# Patient Record
Sex: Female | Born: 1935 | Race: Black or African American | Hispanic: No | Smoking: Never smoker
Health system: Southern US, Community
[De-identification: ages and names within clinical notes are randomized; demographics above are authoritative.]

## PROBLEM LIST (undated history)

## (undated) DIAGNOSIS — I447 Left bundle-branch block, unspecified: Secondary | ICD-10-CM

## (undated) DIAGNOSIS — E785 Hyperlipidemia, unspecified: Secondary | ICD-10-CM

## (undated) DIAGNOSIS — I10 Essential (primary) hypertension: Secondary | ICD-10-CM

## (undated) DIAGNOSIS — R55 Syncope and collapse: Secondary | ICD-10-CM

## (undated) DIAGNOSIS — R011 Cardiac murmur, unspecified: Secondary | ICD-10-CM

## (undated) DIAGNOSIS — Z87440 Personal history of urinary (tract) infections: Secondary | ICD-10-CM

## (undated) HISTORY — DX: Essential (primary) hypertension: I10

## (undated) HISTORY — DX: Hyperlipidemia, unspecified: E78.5

## (undated) HISTORY — PX: ABDOMINAL HYSTERECTOMY: SHX81

## (undated) HISTORY — DX: Personal history of urinary (tract) infections: Z87.440

## (undated) HISTORY — DX: Cardiac murmur, unspecified: R01.1

## (undated) HISTORY — DX: Syncope and collapse: R55

## (undated) HISTORY — DX: Left bundle-branch block, unspecified: I44.7

---

## 2004-07-27 ENCOUNTER — Ambulatory Visit: Payer: Self-pay

## 2005-02-28 ENCOUNTER — Ambulatory Visit: Payer: Self-pay

## 2005-06-05 ENCOUNTER — Ambulatory Visit: Payer: Self-pay

## 2005-08-14 ENCOUNTER — Ambulatory Visit: Payer: Self-pay

## 2006-04-25 ENCOUNTER — Ambulatory Visit: Payer: Self-pay | Admitting: Unknown Physician Specialty

## 2006-05-09 ENCOUNTER — Ambulatory Visit: Payer: Self-pay | Admitting: Unknown Physician Specialty

## 2006-08-29 ENCOUNTER — Ambulatory Visit: Payer: Self-pay

## 2007-09-16 ENCOUNTER — Ambulatory Visit: Payer: Self-pay

## 2008-09-21 ENCOUNTER — Ambulatory Visit: Payer: Self-pay

## 2009-10-10 ENCOUNTER — Ambulatory Visit: Payer: Self-pay

## 2010-07-11 ENCOUNTER — Emergency Department: Payer: Self-pay | Admitting: Emergency Medicine

## 2010-10-31 ENCOUNTER — Ambulatory Visit: Payer: Self-pay

## 2011-12-06 ENCOUNTER — Ambulatory Visit: Payer: Self-pay

## 2012-12-25 ENCOUNTER — Ambulatory Visit: Payer: Self-pay

## 2013-03-31 ENCOUNTER — Observation Stay: Payer: Self-pay | Admitting: Student

## 2013-03-31 LAB — URINALYSIS, COMPLETE
BACTERIA: NONE SEEN
Bilirubin,UR: NEGATIVE
GLUCOSE, UR: NEGATIVE mg/dL (ref 0–75)
NITRITE: NEGATIVE
PROTEIN: NEGATIVE
Ph: 8 (ref 4.5–8.0)
Specific Gravity: 1.004 (ref 1.003–1.030)
Squamous Epithelial: 2
WBC UR: 4 /HPF (ref 0–5)

## 2013-03-31 LAB — TROPONIN I: TROPONIN-I: 0.03 ng/mL

## 2013-03-31 LAB — BASIC METABOLIC PANEL
Anion Gap: 7 (ref 7–16)
BUN: 10 mg/dL (ref 7–18)
Calcium, Total: 8.6 mg/dL (ref 8.5–10.1)
Chloride: 102 mmol/L (ref 98–107)
Co2: 26 mmol/L (ref 21–32)
Creatinine: 0.79 mg/dL (ref 0.60–1.30)
EGFR (African American): >60
EGFR (Non-African Amer.): >60
GLUCOSE: 93 mg/dL (ref 65–99)
Osmolality: 269 (ref 275–301)
Potassium: 3.6 mmol/L (ref 3.5–5.1)
SODIUM: 135 mmol/L — AB (ref 136–145)

## 2013-03-31 LAB — CBC WITH DIFFERENTIAL/PLATELET
BASOS PCT: 0.6 %
Basophil #: 0 10*3/uL (ref 0.0–0.1)
Eosinophil #: 0.1 10*3/uL (ref 0.0–0.7)
Eosinophil %: 0.7 %
HCT: 40.7 % (ref 35.0–47.0)
HGB: 13.1 g/dL (ref 12.0–16.0)
LYMPHS ABS: 2.6 10*3/uL (ref 1.0–3.6)
LYMPHS PCT: 38.7 %
MCH: 29.4 pg (ref 26.0–34.0)
MCHC: 32.1 g/dL (ref 32.0–36.0)
MCV: 91 fL (ref 80–100)
Monocyte #: 0.7 x10 3/mm (ref 0.2–0.9)
Monocyte %: 10.1 %
Neutrophil #: 3.4 10*3/uL (ref 1.4–6.5)
Neutrophil %: 49.9 %
Platelet: 254 10*3/uL (ref 150–440)
RBC: 4.45 10*6/uL (ref 3.80–5.20)
RDW: 13.3 % (ref 11.5–14.5)
WBC: 6.8 10*3/uL (ref 3.6–11.0)

## 2013-03-31 LAB — CK-MB
CK-MB: 1.9 ng/mL (ref 0.5–3.6)
CK-MB: 1.9 ng/mL (ref 0.5–3.6)

## 2013-04-01 DIAGNOSIS — I359 Nonrheumatic aortic valve disorder, unspecified: Secondary | ICD-10-CM

## 2013-04-01 LAB — CBC WITH DIFFERENTIAL/PLATELET
BASOS ABS: 0.1 10*3/uL (ref 0.0–0.1)
Basophil %: 0.9 %
EOS ABS: 0 10*3/uL (ref 0.0–0.7)
Eosinophil %: 0.7 %
HCT: 37.3 % (ref 35.0–47.0)
HGB: 12.6 g/dL (ref 12.0–16.0)
LYMPHS ABS: 2.2 10*3/uL (ref 1.0–3.6)
LYMPHS PCT: 33.9 %
MCH: 30.6 pg (ref 26.0–34.0)
MCHC: 33.9 g/dL (ref 32.0–36.0)
MCV: 91 fL (ref 80–100)
MONOS PCT: 10.4 %
Monocyte #: 0.7 x10 3/mm (ref 0.2–0.9)
Neutrophil #: 3.5 10*3/uL (ref 1.4–6.5)
Neutrophil %: 54.1 %
Platelet: 240 10*3/uL (ref 150–440)
RBC: 4.12 10*6/uL (ref 3.80–5.20)
RDW: 13 % (ref 11.5–14.5)
WBC: 6.5 10*3/uL (ref 3.6–11.0)

## 2013-04-01 LAB — MAGNESIUM: Magnesium: 2.2 mg/dL

## 2013-04-01 LAB — BASIC METABOLIC PANEL
ANION GAP: 4 — AB (ref 7–16)
BUN: 10 mg/dL (ref 7–18)
CALCIUM: 8.4 mg/dL — AB (ref 8.5–10.1)
CREATININE: 0.77 mg/dL (ref 0.60–1.30)
Chloride: 108 mmol/L — ABNORMAL HIGH (ref 98–107)
Co2: 29 mmol/L (ref 21–32)
EGFR (Non-African Amer.): >60
GLUCOSE: 112 mg/dL — AB (ref 65–99)
OSMOLALITY: 281 (ref 275–301)
Potassium: 3.4 mmol/L — ABNORMAL LOW (ref 3.5–5.1)
Sodium: 141 mmol/L (ref 136–145)

## 2013-04-01 LAB — TROPONIN I: TROPONIN-I: 0.04 ng/mL

## 2013-04-01 LAB — OCCULT BLOOD X 1 CARD TO LAB, STOOL: Occult Blood, Feces: NEGATIVE

## 2013-04-01 LAB — CK-MB: CK-MB: 2.4 ng/mL (ref 0.5–3.6)

## 2013-04-02 ENCOUNTER — Telehealth: Payer: Self-pay

## 2013-04-02 NOTE — Telephone Encounter (Signed)
Called pt to see what hospital she was seen in. Scheduled as [facility], but not in [place] .

## 2013-04-13 ENCOUNTER — Encounter: Payer: Self-pay | Admitting: *Deleted

## 2013-04-21 ENCOUNTER — Encounter: Payer: Self-pay | Admitting: Cardiovascular Disease

## 2013-04-21 ENCOUNTER — Ambulatory Visit (INDEPENDENT_AMBULATORY_CARE_PROVIDER_SITE_OTHER): Payer: Medicare Other | Admitting: Cardiovascular Disease

## 2013-04-21 VITALS — BP 168/83 | HR 71 | Ht 64.0 in | Wt 153.2 lb

## 2013-04-21 DIAGNOSIS — I10 Essential (primary) hypertension: Secondary | ICD-10-CM

## 2013-04-21 DIAGNOSIS — R55 Syncope and collapse: Secondary | ICD-10-CM

## 2013-04-21 MED ORDER — AMLODIPINE BESYLATE 10 MG PO TABS: 10.0000 mg | ORAL_TABLET | Freq: Every day | ORAL | Status: DC

## 2013-04-21 NOTE — Patient Instructions (Signed)
Your physician has recommended you make the following change in your medication:  Amlodipine 10 mg once daily   Your physician wants you to follow-up in: 6 months. You will receive a reminder letter in the mail two months in advance. If you don't receive a letter, please call our office to schedule the follow-up appointment.

## 2013-04-23 ENCOUNTER — Encounter: Payer: Self-pay | Admitting: Cardiovascular Disease

## 2013-04-23 DIAGNOSIS — I10 Essential (primary) hypertension: Secondary | ICD-10-CM | POA: Insufficient documentation

## 2013-04-23 DIAGNOSIS — R55 Syncope and collapse: Secondary | ICD-10-CM | POA: Insufficient documentation

## 2013-04-23 NOTE — Assessment & Plan Note (Signed)
Blood pressure continues to be elevated. I increased amlodipine to 10 mg once daily. I consider switching metoprolol to carvedilol. However, this might lead to more dizziness. If further blood pressure control is needed, I will consider adding an ACE inhibitor or an ARB. We might have to a low blood pressure to be on the high side due to orthostatic hypotension.

## 2013-04-23 NOTE — Progress Notes (Signed)
Primary care physician: Dr. Loma Sender   HPI  This is an 80 year old female who was referred for evaluation of presyncope after recent hospitalization in March at [facility] . She has been seen by [place] in the past. She has known history of hypertension and hyperlipidemia. She presented after she had presyncopal episodes. It was mostly after standing up. There was no palpitations, dyspnea or chest pain. She was found a blood pressure of 207/88 on presentation. Cardiac enzymes were negative. ECG showed sinus rhythm with left bundle branch block. Telemetry showed no arrhythmia. Echocardiogram showed an ejection fraction of 60-65%. Carotid Doppler showed less than 50% stenosis bilaterally. CT head without contrast showed no acute abnormalities. She used to be on hydrochlorothiazide but that was switched amlodipine. Percent continues to be elevated at home. Her blood pressure tends to be very labile.    Allergies  Allergen Reactions  . Statins     Hives     Current Outpatient Prescriptions on File Prior to Visit  Medication Sig Dispense Refill  . aspirin 81 MG tablet Take 81 mg by mouth daily.      Marland Kitchen ezetimibe (ZETIA) 10 MG tablet Take 10 mg by mouth daily.      . metoprolol tartrate (LOPRESSOR) 25 MG tablet Take 25 mg by mouth 2 (two) times daily.       No current facility-administered medications on file prior to visit.     Past Medical History  Diagnosis Date  . Hyperlipidemia   . Hypertension   . Syncope and collapse   . History of kidney infection   . Heart murmur   . LBBB (left bundle branch block)      Past Surgical History  Procedure Laterality Date  . Abdominal hysterectomy       Family History  Problem Relation Age of Onset  . Stroke Mother   . Hypertension Mother      History   Social History  . Marital Status: Divorced    Spouse Name: N/A    Number of Children: N/A  . Years of Education: N/A   Occupational History  . Not on file.   Social History Main  Topics  . Smoking status: Never Smoker   . Smokeless tobacco: Not on file  . Alcohol Use: No  . Drug Use: No  . Sexual Activity: Not on file   Other Topics Concern  . Not on file   Social History Narrative  . No narrative on file      PHYSICAL EXAM   BP 168/83  Pulse 71  Ht 5\' 4"  (1.626 m)  Wt 153 lb 4 oz (69.514 kg)  BMI 26.29 kg/m2 Constitutional: She is oriented to person, place, and time. She appears well-developed and well-nourished. No distress.  HENT: No nasal discharge.  Head: Normocephalic and atraumatic.  Eyes: Pupils are equal and round. No discharge.  Neck: Normal range of motion. Neck supple. No JVD present. No thyromegaly present.  Cardiovascular: Normal rate, regular rhythm, normal heart sounds. Exam reveals no gallop and no friction rub. No murmur heard.  Pulmonary/Chest: Effort normal and breath sounds normal. No stridor. No respiratory distress. She has no wheezes. She has no rales. She exhibits no tenderness.  Abdominal: Soft. Bowel sounds are normal. She exhibits no distension. There is no tenderness. There is no rebound and no guarding.  Musculoskeletal: Normal range of motion. She exhibits no edema and no tenderness.  Neurological: She is alert and oriented to person, place, and time. Coordination normal.  Skin: Skin is warm and dry. No rash noted. She is not diaphoretic. No erythema. No pallor.  Psychiatric: She has a normal mood and affect. Her behavior is normal. Judgment and thought content normal.     BJY:NWGNFEKG:Sinus  Rhythm  - frequent PAC s  # PACs = 2. - left bundle branch block.    ABNORMAL    ASSESSMENT AND PLAN

## 2013-04-23 NOTE — Assessment & Plan Note (Signed)
I suspect that this is likely due to orthostatic hypotension. She is thin orthostatic today. She does have left bundle branch block with echocardiogram showed normal LV systolic function. There is no suggestion of arrhythmia. I agree with avoiding diuretics. I instructed her to avoid sudden standing up and to stay well-hydrated. If this continues to be an issue after adjusting her medications, thigh-high support stockings can be considered.

## 2013-10-23 ENCOUNTER — Encounter: Payer: Self-pay | Admitting: Cardiovascular Disease

## 2013-10-23 ENCOUNTER — Ambulatory Visit (INDEPENDENT_AMBULATORY_CARE_PROVIDER_SITE_OTHER): Payer: Medicare Other | Admitting: Cardiovascular Disease

## 2013-10-23 VITALS — BP 120/64 | HR 72 | Ht 64.0 in | Wt 156.0 lb

## 2013-10-23 DIAGNOSIS — I10 Essential (primary) hypertension: Secondary | ICD-10-CM

## 2013-10-23 DIAGNOSIS — R55 Syncope and collapse: Secondary | ICD-10-CM

## 2013-10-23 DIAGNOSIS — R0602 Shortness of breath: Secondary | ICD-10-CM

## 2013-10-23 NOTE — Patient Instructions (Addendum)
[facility] MYOVIEW  Your caregiver has ordered a Stress Test with nuclear imaging. The purpose of this test is to evaluate the blood supply to your heart muscle. This procedure is referred to as a "Non-Invasive Stress Test." This is because other than having an IV started in your vein, nothing is inserted or "invades" your body. Cardiac stress tests are done to find areas of poor blood flow to the heart by determining the extent of coronary artery disease (CAD). Some patients exercise on a treadmill, which naturally increases the blood flow to your heart, while others who are  unable to walk on a treadmill due to physical limitations have a pharmacologic/chemical stress agent called Lexiscan . This medicine will mimic walking on a treadmill by temporarily increasing your coronary blood flow.   Please note: these test may take anywhere between 2-4 hours to complete  PLEASE REPORT TO [facility] ENTR[facility]  THE VOLUNTEERS AT THE FIRST DESK WILL DIRECT YOU WHERE TO GO  Date of Procedure:______10/15/15_______________________________  Arrival Time for Procedure:_______0945 am _______________________   PLEASE NOTIFY THE OFFICE AT LEAST 24 HOURS IN ADVANCE IF YOU ARE UNABLE TO KEEP YOUR APPOINTMENT.  [phone] AND  PLEASE NOTIFY [facility] AT [facility] AT LEAST 24 HOURS IN ADVANCE IF YOU ARE UNABLE TO KEEP YOUR APPOINTMENT. [phone]  How to prepare for your Myoview test:  1. Do not eat or drink after midnight 2. No caffeine for 24 hours prior to test 3. No smoking 24 hours prior to test. 4. Your medication may be taken with water.  If your doctor stopped a medication because of this test, do not take that medication. 5. Ladies, please do not wear dresses.  Skirts or pants are appropriate. Please wear a short sleeve shirt. 6. No perfume, cologne or lotion. 7. Wear comfortable walking shoes. No heels!   Your physician recommends that you schedule a follow-up appointment in:  As needed with  Dr. Kirke CorinArida

## 2013-10-23 NOTE — Assessment & Plan Note (Signed)
This was likely due to orthostatic hypotension and has resolved after stopping hydrochlorothiazide.

## 2013-10-23 NOTE — Progress Notes (Signed)
Primary care physician: Dr. Loma Senderharles Phillips   HPI  This is an 80 year old female who is here today for a followup visit regarding presyncope which was thought to be due to orthostatic hypotension. Symptoms improved after stopping hydrochlorothiazide. She has known history of hypertension and hyperlipidemia. Echocardiogram showed an ejection fraction of 60-65%. Carotid Doppler showed less than 50% stenosis bilaterally. CT head without contrast showed no acute abnormalities. Blood pressure is now overall controlled with amlodipine. She has not had any significant dizziness, syncope or presyncope. However, she is complaining of significant exertional fatigue and dyspnea without chest discomfort which is unusual for her given that she has been active throughout her life.   Allergies  Allergen Reactions  . Statins     Hives     Current Outpatient Prescriptions on File Prior to Visit  Medication Sig Dispense Refill  . amLODipine (NORVASC) 10 MG tablet Take 1 tablet (10 mg total) by mouth daily.  30 tablet  6  . aspirin 81 MG tablet Take 81 mg by mouth daily.      Marland Kitchen. ezetimibe (ZETIA) 10 MG tablet Take 10 mg by mouth daily.       No current facility-administered medications on file prior to visit.     Past Medical History  Diagnosis Date  . Hyperlipidemia   . Syncope and collapse   . History of kidney infection   . Heart murmur   . LBBB (left bundle branch block)   . Hypertension      Past Surgical History  Procedure Laterality Date  . Abdominal hysterectomy       Family History  Problem Relation Age of Onset  . Stroke Mother   . Hypertension Mother      History   Social History  . Marital Status: Divorced    Spouse Name: N/A    Number of Children: N/A  . Years of Education: N/A   Occupational History  . Not on file.   Social History Main Topics  . Smoking status: Never Smoker   . Smokeless tobacco: Not on file  . Alcohol Use: No  . Drug Use: No  . Sexual  Activity: Not on file   Other Topics Concern  . Not on file   Social History Narrative  . No narrative on file      PHYSICAL EXAM   BP 120/64  Pulse 72  Ht 5\' 4"  (1.626 m)  Wt 156 lb (70.761 kg)  BMI 26.76 kg/m2 Constitutional: She is oriented to person, place, and time. She appears well-developed and well-nourished. No distress.  HENT: No nasal discharge.  Head: Normocephalic and atraumatic.  Eyes: Pupils are equal and round. No discharge.  Neck: Normal range of motion. Neck supple. No JVD present. No thyromegaly present.  Cardiovascular: Normal rate, regular rhythm, normal heart sounds. Exam reveals no gallop and no friction rub. No murmur heard.  Pulmonary/Chest: Effort normal and breath sounds normal. No stridor. No respiratory distress. She has no wheezes. She has no rales. She exhibits no tenderness.  Abdominal: Soft. Bowel sounds are normal. She exhibits no distension. There is no tenderness. There is no rebound and no guarding.  Musculoskeletal: Normal range of motion. She exhibits no edema and no tenderness.  Neurological: She is alert and oriented to person, place, and time. Coordination normal.  Skin: Skin is warm and dry. No rash noted. She is not diaphoretic. No erythema. No pallor.  Psychiatric: She has a normal mood and affect. Her behavior is normal. Judgment  and thought content normal.     GEX:BMWUXEKG:Sinus  Rhythm  -Incomplete left bundle branch block.   -Nonspecific ST depression  -Nondiagnostic.   ABNORMAL    ASSESSMENT AND PLAN

## 2013-10-23 NOTE — Assessment & Plan Note (Signed)
The patient has significant exertional dyspnea without chest discomfort. She has underlying left bundle branch block. The dyspnea could be multifactorial due to age and physical deconditioning. However, underlying ischemic heart disease has not been evaluated. I requested a pharmacologic nuclear stress test for evaluation.

## 2013-10-23 NOTE — Assessment & Plan Note (Signed)
Blood pressure is well controlled now on amlodipine. She only has trace lower extremity edema.

## 2013-10-29 ENCOUNTER — Ambulatory Visit: Payer: Self-pay | Admitting: Cardiovascular Disease

## 2013-10-29 DIAGNOSIS — R0602 Shortness of breath: Secondary | ICD-10-CM

## 2013-10-30 ENCOUNTER — Other Ambulatory Visit: Payer: Self-pay

## 2013-10-30 DIAGNOSIS — R0602 Shortness of breath: Secondary | ICD-10-CM

## 2013-11-02 ENCOUNTER — Encounter: Payer: Self-pay | Admitting: *Deleted

## 2014-01-01 ENCOUNTER — Emergency Department: Payer: Self-pay | Admitting: Emergency Medicine

## 2014-01-01 LAB — CBC WITH DIFFERENTIAL/PLATELET
BASOS ABS: 0 10*3/uL (ref 0.0–0.1)
Basophil %: 0.7 %
EOS PCT: 0.9 %
Eosinophil #: 0.1 10*3/uL (ref 0.0–0.7)
HCT: 43.1 % (ref 35.0–47.0)
HGB: 14 g/dL (ref 12.0–16.0)
LYMPHS ABS: 2.1 10*3/uL (ref 1.0–3.6)
LYMPHS PCT: 31.2 %
MCH: 30.4 pg (ref 26.0–34.0)
MCHC: 32.5 g/dL (ref 32.0–36.0)
MCV: 94 fL (ref 80–100)
MONO ABS: 0.5 x10 3/mm (ref 0.2–0.9)
Monocyte %: 7.9 %
NEUTROS ABS: 4 10*3/uL (ref 1.4–6.5)
Neutrophil %: 59.3 %
PLATELETS: 273 10*3/uL (ref 150–440)
RBC: 4.61 10*6/uL (ref 3.80–5.20)
RDW: 13.2 % (ref 11.5–14.5)
WBC: 6.8 10*3/uL (ref 3.6–11.0)

## 2014-01-01 LAB — URINALYSIS, COMPLETE
Bilirubin,UR: NEGATIVE
GLUCOSE, UR: NEGATIVE mg/dL (ref 0–75)
Hyaline Cast: 11
Ketone: NEGATIVE
Nitrite: NEGATIVE
PROTEIN: NEGATIVE
Ph: 6 (ref 4.5–8.0)
Specific Gravity: 1.012 (ref 1.003–1.030)
WBC UR: 12 /HPF (ref 0–5)

## 2014-01-01 LAB — BASIC METABOLIC PANEL
ANION GAP: 6 — AB (ref 7–16)
BUN: 11 mg/dL (ref 7–18)
CALCIUM: 9.5 mg/dL (ref 8.5–10.1)
Chloride: 102 mmol/L (ref 98–107)
Co2: 30 mmol/L (ref 21–32)
Creatinine: 0.94 mg/dL (ref 0.60–1.30)
EGFR (African American): >60
EGFR (Non-African Amer.): >60
GLUCOSE: 176 mg/dL — AB (ref 65–99)
OSMOLALITY: 279 (ref 275–301)
POTASSIUM: 3.5 mmol/L (ref 3.5–5.1)
Sodium: 138 mmol/L (ref 136–145)

## 2014-01-01 LAB — TROPONIN I: Troponin-I: 0.03 ng/mL

## 2014-01-03 LAB — URINE CULTURE

## 2014-02-11 ENCOUNTER — Ambulatory Visit: Payer: Self-pay

## 2014-05-08 NOTE — H&P (Signed)
PATIENT NAME:  Samantha Saunders, Samantha Saunders MR#:  [id number] DATE OF BIRTH:  1926/05/08  DATE OF ADMISSION:  03/31/2013  ADMITTING PHYSICIAN: Enid Baas, MD  PRIMARY CARE PHYSICIAN: Marcine Matar., MD  CHIEF COMPLAINT: Near syncope.   HISTORY OF PRESENT ILLNESS: Samantha Saunders is an 81 year old Caucasian female with past medical history significant for hypertension and hyperlipidemia who presents after she had a near syncopal episode at home this morning. The patient states she had a similar episode while she was taking a shower 2 weeks ago where she almost blacked out, but did not completely pass out. She did not pay much attention to it, but today she was going into the kitchen to get some water. She was at the sink and then suddenly everything went black. She felt weak but did not fall on the floor. She states that after a few minutes her vision improved again. She does have on and off diarrhea for several months, occasional dark stools, but she states it depends upon what she eats and has been eating and drinking fine recently. She is currently alert, oriented and not in any distress at this time. No previous history of strokes or mini strokes in the past. No seizure history.   PAST MEDICAL HISTORY:  1.  Hypertension.  2.  Hyperlipidemia.   PAST SURGICAL HISTORY:  Hysterectomy.   ALLERGIES TO MEDICATIONS: INTOLERANT TO STATIN AS IT CAUSES HIVES.   CURRENT HOME MEDICATIONS: Aspirin 81 milligrams by mouth daily. She also takes blood pressure and hypercholesterol medicine, but does not know the names of them.   SOCIAL HISTORY: Lives at home by herself. No smoking or alcohol use.   FAMILY HISTORY: History of strokes in the family.   REVIEW OF SYSTEMS:  CONSTITUTIONAL: No fever, fatigue or weakness.  EYES: No blurry vision, double vision, inflammation, glaucoma or cataracts. Uses reading glasses.  EARS, NOSE AND THROAT: No tinnitus, ear pain, hearing loss, epistaxis or discharge.   RESPIRATORY: No cough, wheeze, hemoptysis or COPD.  CARDIOVASCULAR: No chest pain, orthopnea, edema, arrhythmia, palpitations, osteopenia or syncope.  GASTROINTESTINAL: No nausea or vomiting. Positive for occasional diarrhea. No abdominal pain, hematemesis or melena.  GENITOURINARY: No dysuria, hematuria, renal calculus, frequency or incontinence.  ENDOCRINE: No polyuria, nocturia, thyroid problems, heat or cold intolerance.  HEMATOLOGY: No anemia, easy bruising or bleeding.  SKIN: No acne, rash or lesions.  MUSCULOSKELETAL: No neck, back, shoulder pain, arthritis or gout.  NEUROLOGIC: No numbness, weakness, CVA, TIA or seizures.  PSYCHOLOGIC: No anxiety, insomnia or depression.   PHYSICAL EXAMINATION:  VITAL SIGNS: Temperature 98.4 degrees Fahrenheit, pulse 91, respirations 20, blood pressure 170/94, pulse oximetry 99% on room air.  GENERAL: Well-built, well-nourished female lying in bed, not in any acute distress.  HEENT: Normocephalic, atraumatic. Pupils equal, round and reacting to light. Anicteric sclerae. Extraocular movements intact.  OROPHARYNX: Clear without erythema, mass or exudates.  NECK: Supple. No thyromegaly, JVD or carotid bruits. No lymphadenopathy.  LUNGS: Moving air bilaterally. No wheeze or crackles. No use of accessory muscles for breathing.  CARDIOVASCULAR: S1, S2 regular rate and rhythm. No murmurs, rubs or gallops.  ABDOMEN: Soft, nontender and nondistended. No hepatosplenomegaly. Normal bowel sounds.  EXTREMITIES: No pedal edema. No clubbing or cyanosis. 2+ dorsalis pedis pulses palpable bilaterally.  SKIN: No acne, rash or lesions.  LYMPHATICS: No cervical or inguinal lymphadenopathy.  NEUROLOGIC: Cranial nerves intact. No focal motor or sensory deficits.  PSYCHOLOGICAL: The patient is awake, alert and oriented x3. No focal motor or  sensory deficits.   LAB DATA: WBC 6.8, hemoglobin 13.1, hematocrit 40.7 and platelet count 254,000.   Sodium 133, potassium 3.6,  chloride 102, bicarbonate 26, BUN 10, creatinine 0.79, glucose 93 and calcium of 8.6. Troponin less than 0.02. Chest x-ray revealing no acute cardiopulmonary disease. Aortic calcification is noted. EKG showing normal sinus rhythm. No acute ST-T wave abnormalities. She also has left bundle branch block.   ASSESSMENT AND PLAN: An 81 year old female with hypertension and hyperlipidemia who presents with near syncope.  1.  Near syncope/syncope. Either cardiogenic or vasovagal in nature.  Her EKG is left bundle branch block. No previous EKG to compare with. Admit, monitor on telemetry, get an echocardiogram and also carotid Dopplers. Continue her baby aspirin at this time and monitor on telemetry.  Recycle troponins. The patient denies any chest pain.  2.  Accelerated hypertension. Blood pressure elevated. Will give IV hydralazine push stat.  Does not know her home medication, so will start on metoprolol and as needed IV hydralazine.  3.  Hyperlipidemia. Again wait until we conform her home medication.   CODE STATUS: FULL CODE.   TIME SPENT ON ADMISSION: Fifty minutes.    ____________________________ Enid Baasadhika Deonne Rooks, MD [id number] D: 03/31/2013 20:48:58 ET T: 03/31/2013 21:03:04 ET JOB#: [id number]  cc: Enid Baasadhika Reka Wist, MD, <Dictator> Marcine Matarharles W. Phillips Jr., MD Enid BaasADHIKA Holland Kotter MD ELECTRONICALLY SIGNED 04/09/2013 16:01

## 2014-05-08 NOTE — Discharge Summary (Signed)
PATIENT NAME:  Samantha Saunders, Samantha Saunders MR#:  [id number] DATE OF BIRTH:  Jun 08, 1926  DATE OF ADMISSION:  03/31/2013 DATE OF DISCHARGE:  04/01/2013  PRIMARY CARE PHYSICIAN: Marcine Matarharles W. Phillips Jr., MD  CHIEF COMPLAINT: Near-syncope.   DISCHARGE DIAGNOSES: 1. Near syncope, likely secondary to accelerated hypertension.  2.   carotid artery plaque which is mild.  3. Hypertension.  4. Hyperlipidemia.  5. Left bundle  branch block of unknown duration.    DISCHARGE MEDICATIONS: Aspirin 81 milligrams daily, Zetia 10 milligrams once a day, metoprolol tartrate 25 milligrams 2 times a day, hydrochlorothiazide 25 milligrams once a day.   DIET: Low sodium.   ACTIVITY: As tolerated.   FOLLOWUP: Please follow with PCP within 1 to 2 weeks. Please follow with cardiology as scheduled for you with Dr. Kirke CorinArida.   DISPOSITION: Home.   CODE STATUS: The patient is FULL CODE.   SIGNIFICANT LABS AND IMAGING: Initial BUN 10, creatinine 0.79, sodium 135. Troponins were negative x3. CK-MBs were within normal limits x3. Initial white count of 6.8, hemoglobin 13.1. UA did not suggest infection. Stool culture: Stool Hemoccult was negative.   Echocardiogram showed normal EF of 60% to 65% with some impaired relaxation pattern of diastolic filling. Ultrasound of the carotids showed moderate atherosclerotic disease in the carotid arteries without significant stenosis. CT of head without contrast: No acute intracranial pathology.   HISTORY OF PRESENT ILLNESS AND HOSPITAL COURSE: For full details of H and P, please see the dictation on March 17 by Dr. Nemiah CommanderKalisetti, but briefly this is an 81 year old with hypertension, hyperlipidemia, who had had 2 bouts of presyncope. The first was a couple of weeks ago in the shower. There was no loss of consciousness or passing out. She had no palpitations or chest pains. She was admitted to the hospitalist service for observation. She was ruled out for acute coronary syndrome. Of note, she did have  elevated blood pressure on arrival and per ED notes, pressures were as high as 207/88. I suspect that played a role with her symptoms. Here, she underwent cyclic cardiac markers, which were negative. She underwent an echocardiogram which showed no significant valvular disease and ultrasound of the carotids showed no significant stenosis although there are some plaque. She is not able to take statins. She will be discharged with aspirin, Zetia and add low-dose metoprolol has been added to her blood pressure medications. She did not have any significant arrhythmias while on telemetry. Although the echocardiogram did show evidence for diastolic dysfunction, she is not in acute CHF and I think that it is likely chronic. She did have a left bundle branch block on EKG; however,  we have no previous EKGs to compare. She, of note, was ruled out for acute coronary syndrome and we have made an appointment with Dr. Kirke CorinArida for followup on Tuesday, April 7 at 2:45 p.m. and this was discussed with the patient. She has been ambulating without significant symptoms and at this point pressures have improved and she will be discharged with the above medications.   TOTAL TIME SPENT: Forty minutes.    ____________________________ Krystal EatonShayiq Lella Mullany, MD [id number] D: 04/02/2013 17:20:00 ET T: 04/03/2013 00:21:54 ET JOB#: [id number]  cc: Krystal EatonShayiq Yesli Vanderhoff, MD, <Dictator> Krystal EatonSHAYIQ Mayce Noyes MD ELECTRONICALLY SIGNED 04/16/2013 15:49

## 2015-11-20 ENCOUNTER — Emergency Department
Admission: EM | Admit: 2015-11-20 | Discharge: 2015-11-20 | Disposition: A | Discharge status: Home / Self Care | Payer: Medicare Other | Attending: Emergency Medicine | Admitting: Emergency Medicine

## 2015-11-20 ENCOUNTER — Emergency Department: Payer: Medicare Other

## 2015-11-20 ENCOUNTER — Encounter: Payer: Self-pay | Admitting: Emergency Medicine

## 2015-11-20 DIAGNOSIS — R42 Dizziness and giddiness: Secondary | ICD-10-CM | POA: Diagnosis present

## 2015-11-20 DIAGNOSIS — Z79899 Other long term (current) drug therapy: Secondary | ICD-10-CM | POA: Diagnosis not present

## 2015-11-20 DIAGNOSIS — I10 Essential (primary) hypertension: Secondary | ICD-10-CM | POA: Diagnosis not present

## 2015-11-20 DIAGNOSIS — R55 Syncope and collapse: Secondary | ICD-10-CM | POA: Diagnosis not present

## 2015-11-20 DIAGNOSIS — Z7982 Long term (current) use of aspirin: Secondary | ICD-10-CM | POA: Diagnosis not present

## 2015-11-20 DIAGNOSIS — R51 Headache: Secondary | ICD-10-CM | POA: Diagnosis not present

## 2015-11-20 LAB — COMPREHENSIVE METABOLIC PANEL
ALK PHOS: 80 U/L (ref 38–126)
ALT: 18 U/L (ref 14–54)
AST: 31 U/L (ref 15–41)
Albumin: 4.2 g/dL (ref 3.5–5.0)
Anion gap: 9 (ref 5–15)
BUN: 14 mg/dL (ref 6–20)
CALCIUM: 9 mg/dL (ref 8.9–10.3)
CHLORIDE: 100 mmol/L — AB (ref 101–111)
CO2: 26 mmol/L (ref 22–32)
CREATININE: 0.8 mg/dL (ref 0.44–1.00)
GFR calc non Af Amer: >60 mL/min (ref >60)
GLUCOSE: 129 mg/dL — AB (ref 65–99)
Potassium: 3 mmol/L — ABNORMAL LOW (ref 3.5–5.1)
SODIUM: 135 mmol/L (ref 135–145)
Total Bilirubin: 0.2 mg/dL — ABNORMAL LOW (ref 0.3–1.2)
Total Protein: 8 g/dL (ref 6.5–8.1)

## 2015-11-20 LAB — CBC WITH DIFFERENTIAL/PLATELET
BASOS ABS: 0.1 10*3/uL (ref 0–0.1)
Basophils Relative: 1 %
EOS ABS: 0 10*3/uL (ref 0–0.7)
EOS PCT: 1 %
HCT: 37.3 % (ref 35.0–47.0)
HEMOGLOBIN: 13 g/dL (ref 12.0–16.0)
LYMPHS ABS: 2.6 10*3/uL (ref 1.0–3.6)
LYMPHS PCT: 40 %
MCH: 31 pg (ref 26.0–34.0)
MCHC: 34.8 g/dL (ref 32.0–36.0)
MCV: 88.9 fL (ref 80.0–100.0)
Monocytes Absolute: 0.9 10*3/uL (ref 0.2–0.9)
Monocytes Relative: 14 %
NEUTROS PCT: 44 %
Neutro Abs: 2.9 10*3/uL (ref 1.4–6.5)
PLATELETS: 248 10*3/uL (ref 150–440)
RBC: 4.2 MIL/uL (ref 3.80–5.20)
RDW: 13.4 % (ref 11.5–14.5)
WBC: 6.5 10*3/uL (ref 3.6–11.0)

## 2015-11-20 LAB — TROPONIN I: Troponin I: <0.03 ng/mL (ref <0.03)

## 2015-11-20 MED ORDER — POTASSIUM CHLORIDE ER 10 MEQ PO TBCR: 10.0000 meq | EXTENDED_RELEASE_TABLET | Freq: Two times a day (BID) | ORAL | 0 refills | Status: DC

## 2015-11-20 MED ORDER — POTASSIUM CHLORIDE CRYS ER 20 MEQ PO TBCR
20.0000 meq | EXTENDED_RELEASE_TABLET | Freq: Once | ORAL | Status: AC
  Administered 2015-11-20: 20 meq via ORAL
  Filled 2015-11-20: qty 1

## 2015-11-20 NOTE — ED Triage Notes (Addendum)
Pt keeps repeating that she her head does not feel right like her blood pressure may be up. Pt denies a specific pain, did not monitor blood pressure at home. Pt repeats to me twice that she had a bladder infection and is taking medication. Daughter denies any changes to her mothers mental status that she has notice. Pt lives alone. Pt alert and oriented times 3. No neurological symptoms noted. Pt does say that she feels dizzy then repeats if feels like by BP is up.

## 2015-11-20 NOTE — ED Provider Notes (Signed)
Time Seen: Approximately 1919 I have reviewed the triage notes  Chief Complaint: Dizziness   History of Present Illness: Samantha Saunders is a 82 y.o. female who presents with feelings of generalized weakness and feelings of lightheadedness. Patient also describes a frontal headache without any nausea or vomiting. She states she feels "" dizzy "". She denies any loss of consciousness or vertiginous type symptoms. No focal weakness in either upper or lower extremities. No trouble walking. She denies any chest or abdominal pain. She felt like her blood pressure was elevated. She's recently finished a course of Macrodantin for a urinary tract infection and has a history of prolapsed bladder which is under evaluation by a urologist. She denies any urinary symptoms currently such as dysuria, hematuria, or urinary frequency. Sometimes she has to strain to urinate which is not a new symptom for her.   Past Medical History:  Diagnosis Date  . Heart murmur   . History of kidney infection   . Hyperlipidemia   . Hypertension   . LBBB (left bundle branch block)   . Syncope and collapse   Previous echocardiogram shows mild aortic regurgitation and mild to moderate aortic valve sclerosis without evidence of aortic stenosis. Echo was performed and 04/01/2013  Patient Active Problem List   Diagnosis Date Noted  . SOB (shortness of breath) 10/23/2013  . Pre-syncope 04/23/2013  . Hypertension     Past Surgical History:  Procedure Laterality Date  . ABDOMINAL HYSTERECTOMY      Past Surgical History:  Procedure Laterality Date  . ABDOMINAL HYSTERECTOMY      Current Outpatient Rx  . Order #: [id number] Class: Normal  . Order #: [id number] Class: Historical Med  . Order #: [id number] Class: Historical Med    Allergies:  Statins  Family History: Family History  Problem Relation Age of Onset  . Stroke Mother   . Hypertension Mother     Social History: Social History  Substance Use Topics   . Smoking status: Never Smoker  . Smokeless tobacco: Not on file  . Alcohol use No     Review of Systems:   10 point review of systems was performed and was otherwise negative:  Constitutional: No fever Eyes: No visual disturbances ENT: No sore throat, ear pain Cardiac: No chest pain Respiratory: No shortness of breath, wheezing, or stridor Abdomen: No abdominal pain, no vomiting, No diarrhea Endocrine: No weight loss, No night sweats Extremities: No peripheral edema, cyanosis Skin: No rashes, easy bruising Neurologic: No focal weakness, trouble with speech or swollowing Urologic: No dysuria, Hematuria, or urinary frequency   Physical Exam:  ED Triage Vitals  Enc Vitals Group     BP 11/20/15 1835 (!) 154/60     Pulse Rate 11/20/15 1835 (!) 104     Resp 11/20/15 1835 20     Temp 11/20/15 1835 97.9 F (36.6 C)     Temp Source 11/20/15 1835 Oral     SpO2 11/20/15 1835 99 %     Weight 11/20/15 1836 140 lb (63.5 kg)     Height 11/20/15 1836 5\' 7"  (1.702 m)     Head Circumference --      Peak Flow --      Pain Score --      Pain Loc --      Pain Edu? --      Excl. in GC? --     General: Awake , Alert , and Oriented times 3; GCS 15 Head: Normal cephalic , atraumatic  Eyes: Pupils equal , round, reactive to light Nose/Throat: No nasal drainage, patent upper airway without erythema or exudate.  Neck: Supple, Full range of motion, No anterior adenopathy or palpable thyroid masses. No bruits Lungs: Clear to ascultation without wheezes , rhonchi, or rales Heart:Regular rate, regular rhythm with a grade 3/6 systolic ejection murmur heard primarily over the right sternal border with left-sided mid systolic click Abdomen: Soft, non tender without rebound, guarding , or rigidity; bowel sounds positive and symmetric in all 4 quadrants. No organomegaly .        Extremities: 2 plus symmetric pulses. No edema, clubbing or cyanosis Neurologic: normal ambulation, Motor symmetric  without deficits, sensory intact Skin: warm, dry, no rashes   Labs:   All laboratory work was reviewed including any pertinent negatives or positives listed below:  Labs Reviewed  CBC WITH DIFFERENTIAL/PLATELET  COMPREHENSIVE METABOLIC PANEL  TROPONIN I  Patient has some mild hypokalemia  EKG:  ED ECG REPORT I, Jennye Moccasin, the attending physician, personally viewed and interpreted this ECG.  Date: 11/20/2015 EKG Time: 1848 Rate: 94 Rhythm: normal sinus rhythm with occasional PACs QRS Axis: normal Intervals: Left bundle branch block pattern ST/T Wave abnormalities: normal Conduction Disturbances: none Narrative Interpretation: unremarkable no significant change from prior  Radiology:  "Dg Chest 2 View  Result Date: 11/20/2015 CLINICAL DATA:  Headache today EXAM: CHEST  2 VIEW COMPARISON:  03/31/2013 FINDINGS: The cardiac silhouette, mediastinal and hilar contours are within normal limits and stable. Stable tortuosity, ectasia and calcification of the thoracic aorta. Suspect emphysematous changes but no acute pulmonary findings. No pleural effusion. The bony thorax is intact. IMPRESSION: No acute cardiopulmonary findings. Electronically Signed   By: Rudie Meyer M.D.   On: 11/20/2015 20:12   Ct Head Wo Contrast  Result Date: 11/20/2015 CLINICAL DATA:  Headache since this afternoon. EXAM: CT HEAD WITHOUT CONTRAST TECHNIQUE: Contiguous axial images were obtained from the base of the skull through the vertex without intravenous contrast. COMPARISON:  Head CT 03/31/2013. FINDINGS: Brain: Stable age related cerebral atrophy, ventriculomegaly and periventricular white matter disease. Remote lacunar type basal ganglia infarcts and remote basal ganglia calcifications. No extra-axial fluid collections are identified. No CT findings for acute hemispheric infarction or intracranial hemorrhage. No mass lesions. The brainstem and cerebellum are normal. Vascular: Stable vascular  calcifications. No definite aneurysm or hyperdense vessels. Skull: No skull fracture or bone lesions. Sinuses/Orbits: The paranasal sinuses and mastoid air cells are clear. The globes are intact. Other: No scout lesions or hematoma. IMPRESSION: Stable CT appearance of brain when compared to prior examination. No acute intracranial findings or skull fracture. Stable vascular calcifications. Electronically Signed   By: Rudie Meyer M.D.   On: 11/20/2015 20:09  "  I personally reviewed the radiologic studies    ED Course:  Patient was continued on the cardiac monitor without any evidence of arrhythmia. There is no signs of acute cerebrovascular accident or other life-threatening cause for her headache and I felt lumbar puncture was not necessary. Patient was given some replacement potassium here him be discharged on a brief prescription for low potassium. She's been advised to follow up with her primary physician along with cardiology. Cardiology mainly to assess for near-syncope with association of aortic valve disease. Cardiogram was approximately 2 years ago. Clinical Course      Assessment: Near syncope Acute unspecified cephalgia Hypokalemia History of aortic valve disease      Plan:  Outpatient " New Prescriptions   POTASSIUM CHLORIDE (K-DUR)  10 MEQ TABLET    Take 1 tablet (10 mEq total) by mouth 2 (two) times daily.  " Patient was advised to return immediately if condition worsens. Patient was advised to follow up with their primary care physician or other specialized physicians involved in their outpatient care. The patient and/or family member/power of attorney had laboratory results reviewed at the bedside. All questions and concerns were addressed and appropriate discharge instructions were distributed by the nursing staff.             Jennye MoccasinBrian S Binnie Droessler, MD 11/20/15 2056

## 2015-11-20 NOTE — Discharge Instructions (Signed)
Return to emergency department especially for chest pain, shortness of breath, focal weakness in either upper or lower extremities, increasing headache, or any other new concerns.  Please return immediately if condition worsens. Please contact her primary physician or the physician you were given for referral. If you have any specialist physicians involved in her treatment and plan please also contact them. Thank you for using [facility].

## 2015-11-21 ENCOUNTER — Telehealth: Payer: Self-pay

## 2015-11-21 NOTE — Telephone Encounter (Signed)
Lmov for patient to call back and schedule ED FU seen for Dizziness

## 2015-11-22 NOTE — Telephone Encounter (Signed)
Correction she is coming 11/25/15 to see Dr Kirke CorinArida

## 2015-11-22 NOTE — Telephone Encounter (Signed)
Called patient and she is coming 11/24/15

## 2015-11-24 ENCOUNTER — Ambulatory Visit: Payer: Medicaid Other | Admitting: Cardiology

## 2015-11-25 ENCOUNTER — Ambulatory Visit (INDEPENDENT_AMBULATORY_CARE_PROVIDER_SITE_OTHER): Payer: Medicare Other | Admitting: Cardiovascular Disease

## 2015-11-25 ENCOUNTER — Encounter: Payer: Self-pay | Admitting: Cardiovascular Disease

## 2015-11-25 VITALS — BP 138/62 | HR 83 | Ht 66.0 in | Wt 146.2 lb

## 2015-11-25 DIAGNOSIS — I10 Essential (primary) hypertension: Secondary | ICD-10-CM

## 2015-11-25 DIAGNOSIS — R55 Syncope and collapse: Secondary | ICD-10-CM

## 2015-11-25 DIAGNOSIS — I359 Nonrheumatic aortic valve disorder, unspecified: Secondary | ICD-10-CM

## 2015-11-25 NOTE — Patient Instructions (Addendum)
Medication Instructions:  Your physician recommends that you continue on your current medications as directed. Please refer to the Current Medication list given to you today.   Labwork: none  Testing/Procedures: Your physician has recommended that you wear a holter monitor. Holter monitors are medical devices that record the heart's electrical activity. Doctors most often use these monitors to diagnose arrhythmias. Arrhythmias are problems with the speed or rhythm of the heartbeat. The monitor is a small, portable device. You can wear one while you do your normal daily activities. This is usually used to diagnose what is causing palpitations/syncope (passing out).  Your physician has requested that you have an echocardiogram. Echocardiography is a painless test that uses sound waves to create images of your heart. It provides your doctor with information about the size and shape of your heart and how well your heart's chambers and valves are working. This procedure takes approximately one hour. There are no restrictions for this procedure.    Follow-Up: Your physician recommends that you schedule a follow-up appointment in: one month with Dr. Kirke CorinArida.    Any Other Special Instructions Will Be Listed Below (If Applicable).     If you need a refill on your cardiac medications before your next appointment, please call your pharmacy.   Holter Monitoring A Holter monitor is a small device that is used to detect abnormal heart rhythms. It clips to your clothing and is connected by wires to flat, sticky disks (electrodes) that attach to your chest. It is worn continuously for 24-48 hours. HOME CARE INSTRUCTIONS  Wear your Holter monitor at all times, even while exercising and sleeping, for as long as directed by your health care provider.  Make sure that the Holter monitor is safely clipped to your clothing or close to your body as recommended by your health care provider.  Do not get the  monitor or wires wet.  Do not put body lotion or moisturizer on your chest.  Keep your skin clean.  Keep a diary of your daily activities, such as walking and doing chores. If you feel that your heartbeat is abnormal or that your heart is fluttering or skipping a beat:  Record what you are doing when it happens.  Record what time of day the symptoms occur.  Return your Holter monitor as directed by your health care provider.  Keep all follow-up visits as directed by your health care provider. This is important. SEEK IMMEDIATE MEDICAL CARE IF:  You feel lightheaded or you faint.  You have trouble breathing.  You feel pain in your chest, upper arm, or jaw.  You feel sick to your stomach and your skin is pale, cool, or damp.  You heartbeat feels unusual or abnormal.   This information is not intended to replace advice given to you by your health care provider. Make sure you discuss any questions you have with your health care provider.   Document Released: 09/30/2003 Document Revised: 01/22/2014 Document Reviewed: 08/10/2013 Elsevier Interactive Patient Education 2016 [employer] Echocardiogram An echocardiogram, or echocardiography, uses sound waves (ultrasound) to produce an image of your heart. The echocardiogram is simple, painless, obtained within a short period of time, and offers valuable information to your health care provider. The images from an echocardiogram can provide information such as:  Evidence of coronary artery disease (CAD).  Heart size.  Heart muscle function.  Heart valve function.  Aneurysm detection.  Evidence of a past heart attack.  Fluid buildup around the heart.  Heart  muscle thickening.  Assess heart valve function. LET [facility] PROVIDER KNOW ABOUT:  Any allergies you have.  All medicines you are taking, including vitamins, herbs, eye drops, creams, and over-the-counter medicines.  Previous problems you or members of your  family have had with the use of anesthetics.  Any blood disorders you have.  Previous surgeries you have had.  Medical conditions you have.  Possibility of pregnancy, if this applies. BEFORE THE PROCEDURE  No special preparation is needed. Eat and drink normally.  PROCEDURE   In order to produce an image of your heart, gel will be applied to your chest and a wand-like tool (transducer) will be moved over your chest. The gel will help transmit the sound waves from the transducer. The sound waves will harmlessly bounce off your heart to allow the heart images to be captured in real-time motion. These images will then be recorded.  You may need an IV to receive a medicine that improves the quality of the pictures. AFTER THE PROCEDURE You may return to your normal schedule including diet, activities, and medicines, unless your health care provider tells you otherwise.   This information is not intended to replace advice given to you by your health care provider. Make sure you discuss any questions you have with your health care provider.   Document Released: 12/30/1999 Document Revised: 01/22/2014 Document Reviewed: 09/08/2012 Elsevier Interactive Patient Education [employer] Elsevier Inc.

## 2015-11-25 NOTE — Progress Notes (Signed)
Cardiology Office Note   Date:  11/25/2015   ID:  Samantha RoersRuby Samantha Irby, DOB [birth year], MRN [id number]  PCP:  Toy CookeyEmily Headrick, FNP  Cardiologist:   Lorine BearsMuhammad Elizabelle Fite, MD   Chief Complaint  Patient presents with  . other    [facility]; pre syncope & decreased potassium level.  Meds reviewed by the pt. verbally.  "doing well."       History of Present Illness: Mora BellmanRuby Ander PurpuraRuth Saunders is a 82 y.o. female who Was referred from [facility] ED for evaluation of dizziness and presyncope. She went to her recently with frontal headache with dizziness and fatigue. There was no syncope. She was treated for UTI before her presentation. She was noted to be hypertensive on presentation with blood pressure of 154/60 with a heart rate of 104. Her labs were remarkable for only hypokalemia. No arrhythmias were noted. She was seen by me in 2015 for presyncope which was thought to be due to orthostatic hypotension. Symptoms improved after stopping hydrochlorothiazide. Evaluation at that time included an Echocardiogram which showed an ejection fraction of 60-65%. Carotid Doppler showed less than 50% stenosis bilaterally. CT head without contrast showed no acute abnormalities. Nuclear stress test was normal. She does have underlying left bundle branch block which is not new. The recent episode of dizziness and presyncope was the first one since 2015. She denies any chest pain or shortness of breath but does complain of an unusual fatigue. No orthopnea, PND or leg edema.     Past Medical History:  Diagnosis Date  . Heart murmur   . History of kidney infection   . Hyperlipidemia   . Hypertension   . LBBB (left bundle branch block)   . Syncope and collapse     Past Surgical History:  Procedure Laterality Date  . ABDOMINAL HYSTERECTOMY       Current Outpatient Prescriptions  Medication Sig Dispense Refill  . amLODipine (NORVASC) 10 MG tablet Take 1 tablet (10 mg total) by mouth daily. 30 tablet 6  .  aspirin 81 MG tablet Take 81 mg by mouth daily.    Marland Kitchen. ezetimibe (ZETIA) 10 MG tablet Take 10 mg by mouth daily.    . potassium chloride (K-DUR) 10 MEQ tablet Take 1 tablet (10 mEq total) by mouth 2 (two) times daily. 20 tablet 0   No current facility-administered medications for this visit.     Allergies:   Statins    Social History:  The patient  reports that she has never smoked. She has never used smokeless tobacco. She reports that she does not drink alcohol or use drugs.   Family History:  The patient's family history includes Hypertension in her mother; Stroke in her mother.    ROS:  Please see the history of present illness.   Otherwise, review of systems are positive for none.   All other systems are reviewed and negative.    PHYSICAL EXAM: VS:  BP 138/62 (BP Location: Left Arm, Patient Position: Sitting, Cuff Size: Normal)   Pulse 83   Ht 5\' 6"  (1.676 m)   Wt 146 lb 4 oz (66.3 kg)   BMI 23.61 kg/m  , BMI Body mass index is 23.61 kg/m. GEN: Well nourished, well developed, in no acute distress  HEENT: normal  Neck: no JVD, carotid bruits, or masses Cardiac: RRR; no  rubs, or gallops,no edema . There is a 3/6 crescendo decrescendo systolic murmur in the aortic area which is mid peaking Respiratory:  clear to auscultation bilaterally, normal work of breathing GI: soft, nontender, nondistended, + BS MS: no deformity or atrophy  Skin: warm and dry, no rash Neuro:  Strength and sensation are intact Psych: euthymic mood, full affect   EKG:  EKG is ordered today. The ekg ordered today demonstrates normal sinus rhythm with left bundle branch block.   Recent Labs: 11/20/2015: ALT 18; BUN 14; Creatinine, Ser 0.80; Hemoglobin 13.0; Platelets 248; Potassium 3.0; Sodium 135    Lipid Panel No results found for: CHOL, TRIG, HDL, CHOLHDL, VLDL, LDLCALC, LDLDIRECT    Wt Readings from Last 3 Encounters:  11/25/15 146 lb 4 oz (66.3 kg)  11/20/15 140 lb (63.5 kg)  10/23/13 156  lb (70.8 kg)       No flowsheet data found.    ASSESSMENT AND PLAN:  1.  Presyncope: The episode could have been triggered by hypokalemia and recent UTI. However, she has underlying left bundle branch block. Thus, I requested a 48-hour Holter monitor to ensure no bradycardic events. She is not orthostatic today.  2. Aortic valve disease: Her aortic valve was noted to be calcified in 2015 and she currently has a murmur congestive of at least mild to moderate aortic stenosis. I requested an echocardiogram.    Disposition:   FU with me in 1 month  Signed,  Lorine BearsMuhammad Ammaar Encina, MD  11/25/2015 4:42 PM    [facility]

## 2015-12-23 ENCOUNTER — Ambulatory Visit (INDEPENDENT_AMBULATORY_CARE_PROVIDER_SITE_OTHER): Payer: Medicare Other

## 2015-12-23 ENCOUNTER — Other Ambulatory Visit: Payer: Self-pay

## 2015-12-23 DIAGNOSIS — I359 Nonrheumatic aortic valve disorder, unspecified: Secondary | ICD-10-CM

## 2015-12-23 DIAGNOSIS — R55 Syncope and collapse: Secondary | ICD-10-CM | POA: Diagnosis not present

## 2015-12-29 ENCOUNTER — Encounter: Payer: Self-pay | Admitting: Cardiovascular Disease

## 2015-12-29 ENCOUNTER — Ambulatory Visit (INDEPENDENT_AMBULATORY_CARE_PROVIDER_SITE_OTHER): Payer: Medicare Other | Admitting: Cardiovascular Disease

## 2015-12-29 VITALS — BP 148/64 | HR 85 | Ht 64.0 in | Wt 140.5 lb

## 2015-12-29 DIAGNOSIS — R55 Syncope and collapse: Secondary | ICD-10-CM

## 2015-12-29 DIAGNOSIS — I10 Essential (primary) hypertension: Secondary | ICD-10-CM | POA: Diagnosis not present

## 2015-12-29 DIAGNOSIS — I359 Nonrheumatic aortic valve disorder, unspecified: Secondary | ICD-10-CM | POA: Diagnosis not present

## 2015-12-29 NOTE — Progress Notes (Signed)
Cardiology Office Note   Date:  12/29/2015   ID:  Samantha Saunders, DOB [birth year], MRN [id number]  PCP:  Toy CookeyEmily Headrick, FNP  Cardiologist:   Lorine BearsMuhammad Arida, MD   Chief Complaint  Patient presents with  . other    F/u echo/holter. Pt c/o being lightheaded in morning until she takes her BP pill. Reviewed meds with pt verbally.      History of Present Illness: Samantha Saunders is a 82 y.o. female who is here today for a follow-up visit regarding  dizziness and presyncope. She had an emergency room visit with frontal headache with dizziness and fatigue. There was no syncope. She was treated for UTI before her presentation. She was noted to be hypertensive on presentation with blood pressure of 154/60 with a heart rate of 104. Her labs were remarkable for only hypokalemia. No arrhythmias were noted. She was seen by me in 2015 for presyncope which was thought to be due to orthostatic hypotension. Symptoms improved after stopping hydrochlorothiazide.  She does have underlying left bundle branch block which is not new. I ordered an echocardiogram which showed normal LV systolic function, moderately calcified aortic valve with no significant stenosis, mild mitral regurgitation and borderline pulmonary hypertension. She had a Holter monitor done but results aren't available yet.  She has been doing very well and denies any chest pain, shortness of breath, dizziness or syncope.     Past Medical History:  Diagnosis Date  . Heart murmur   . History of kidney infection   . Hyperlipidemia   . Hypertension   . LBBB (left bundle branch block)   . Syncope and collapse     Past Surgical History:  Procedure Laterality Date  . ABDOMINAL HYSTERECTOMY       Current Outpatient Prescriptions  Medication Sig Dispense Refill  . amLODipine (NORVASC) 10 MG tablet Take 1 tablet (10 mg total) by mouth daily. 30 tablet 6  . aspirin 81 MG tablet Take 81 mg by mouth daily.    Marland Kitchen. ezetimibe (ZETIA)  10 MG tablet Take 10 mg by mouth daily.    . potassium chloride (K-DUR) 10 MEQ tablet Take 1 tablet (10 mEq total) by mouth 2 (two) times daily. 20 tablet 0   No current facility-administered medications for this visit.     Allergies:   Statins    Social History:  The patient  reports that she has never smoked. She has never used smokeless tobacco. She reports that she does not drink alcohol or use drugs.   Family History:  The patient's family history includes Hypertension in her mother; Stroke in her mother.    ROS:  Please see the history of present illness.   Otherwise, review of systems are positive for none.   All other systems are reviewed and negative.    PHYSICAL EXAM: VS:  BP (!) 148/64 (BP Location: Left Arm, Patient Position: Sitting, Cuff Size: Normal)   Pulse 85   Ht 5\' 4"  (1.626 m)   Wt 140 lb 8 oz (63.7 kg)   BMI 24.12 kg/m  , BMI Body mass index is 24.12 kg/m. GEN: Well nourished, well developed, in no acute distress  HEENT: normal  Neck: no JVD, carotid bruits, or masses Cardiac: RRR; no  rubs, or gallops,no edema . There is a 2/6 crescendo decrescendo systolic murmur in the aortic area which is mid peaking Respiratory:  clear to auscultation bilaterally, normal work of breathing GI: soft, nontender, nondistended, + BS MS:  no deformity or atrophy  Skin: warm and dry, no rash Neuro:  Strength and sensation are intact Psych: euthymic mood, full affect   EKG:  EKG is ordered today. The ekg ordered today demonstrates normal sinus rhythm with left bundle branch block With PVCs   Recent Labs: 11/20/2015: ALT 18; BUN 14; Creatinine, Ser 0.80; Hemoglobin 13.0; Platelets 248; Potassium 3.0; Sodium 135    Lipid Panel No results found for: CHOL, TRIG, HDL, CHOLHDL, VLDL, LDLCALC, LDLDIRECT    Wt Readings from Last 3 Encounters:  12/29/15 140 lb 8 oz (63.7 kg)  11/25/15 146 lb 4 oz (66.3 kg)  11/20/15 140 lb (63.5 kg)       No flowsheet data  found.    ASSESSMENT AND PLAN:  1.  Presyncope: Likely was triggered by hypokalemia and  UTI.  Holter monitor is still pending.  2. Aortic valve disease:  Echocardiogram showed normal LV systolic function with calcified aortic valve without stenosis and no other significant valvular abnormalities.   Disposition:   FU with me in prn  Signed,  Lorine BearsMuhammad Arida, MD  12/29/2015 3:15 PM    [facility]

## 2015-12-29 NOTE — Patient Instructions (Signed)
Medication Instructions:  Your physician recommends that you continue on your current medications as directed. Please refer to the Current Medication list given to you today.   Labwork: none  Testing/Procedures: None.  Follow-Up: Your physician recommends that you schedule a follow-up appointment as needed with Dr. Kirke CorinArida.    Any Other Special Instructions Will Be Listed Below (If Applicable).     If you need a refill on your cardiac medications before your next appointment, please call your pharmacy.

## 2015-12-30 ENCOUNTER — Ambulatory Visit
Admission: RE | Admit: 2015-12-30 | Discharge: 2015-12-30 | Disposition: A | Discharge status: Home / Self Care | Payer: Medicare Other | Source: Ambulatory Visit | Attending: Cardiovascular Disease | Admitting: Cardiovascular Disease

## 2015-12-30 DIAGNOSIS — R55 Syncope and collapse: Secondary | ICD-10-CM | POA: Diagnosis not present

## 2016-04-26 ENCOUNTER — Telehealth: Payer: Self-pay | Admitting: Obstetrics and Gynecology

## 2016-04-26 NOTE — Telephone Encounter (Signed)
Per Maralyn Sago there are no openings until the scheduled date. Please advise if she needs to be worked in/double booked before Monday.

## 2016-04-26 NOTE — Telephone Encounter (Signed)
The 16th should be fine

## 2016-04-26 NOTE — Telephone Encounter (Signed)
Pt is schedule with Dr. Jean Rosenthal 04/30/16 for pessary Check and abnormal bleeding. Pt is reporting Bleeding and feels like she needs to be seen due to having Pessary. Please advise if pt needs to to be seen more sooner then scheduled appointment.

## 2016-04-26 NOTE — Telephone Encounter (Signed)
Pt aware ok to wait for Monday.

## 2016-04-30 ENCOUNTER — Ambulatory Visit (INDEPENDENT_AMBULATORY_CARE_PROVIDER_SITE_OTHER): Payer: Medicare Other | Admitting: Obstetrics and Gynecology

## 2016-04-30 ENCOUNTER — Encounter: Payer: Self-pay | Admitting: Obstetrics and Gynecology

## 2016-04-30 DIAGNOSIS — N993 Prolapse of vaginal vault after hysterectomy: Secondary | ICD-10-CM

## 2016-04-30 NOTE — Progress Notes (Signed)
  HPI:      [name] is a 83 y.o. who presents today for her pessary follow up and examination related to her pelvic floor weakening.  Pt reports tolerating the pessary well with vaginal bleeding that has happened on several occasions over the past couple weeks.  Symptoms of pelvic floor weakening have greatly improved. She is voiding and defecating without difficulty. She currently has a #3 ring with support for her pessary. She describes the bleeding as scant and not heavy, bright red. Denies hematuria, melena, hematochezia, abdominal and pelvic pain, fevers.   PMHx: She  has a past medical history of Heart murmur; History of kidney infection; Hyperlipidemia; Hypertension; LBBB (left bundle branch block); and Syncope and collapse. Also,  has a past surgical history that includes Abdominal hysterectomy., family history includes Hypertension in her mother; Stroke in her mother.,  reports that she has never smoked. She has never used smokeless tobacco. She reports that she does not drink alcohol or use drugs.  She has a current medication list which includes the following prescription(s): amlodipine, aspirin, potassium chloride, and ezetimibe. Also, is allergic to statins.  Review of Systems  Constitutional: Negative.   Gastrointestinal: Negative.   Genitourinary: Negative.     Objective: BP 128/82   Ht  (1.626 m)   Wt 145 lb (65.8 kg)   BMI 24.89 kg/m  Physical Exam  Constitutional: She is oriented to person, place, and time. She appears well-developed and well-nourished. No distress.  Genitourinary: Pelvic exam was performed with patient supine. There is no rash, tenderness, lesion or injury on the right labia. There is no rash, tenderness, lesion or injury on the left labia.  Genitourinary Comments: Pessary removed. Small erosion noted on left posterior vaginal wall.  No other lesions noted visually or palpated.  Mild oozing from erosion. No apparent ulceration into bowel,  bladder, or abdominal cavity.   Pessary not replaced.  Neck: Normal range of motion.  Pulmonary/Chest: Effort normal.  Abdominal: Soft. Bowel sounds are normal. She exhibits no distension. There is no tenderness. There is no guarding.  Neurological: She is alert and oriented to person, place, and time. No cranial nerve deficit.  Skin: Skin is warm and dry. No rash noted.  Psychiatric: She has a normal mood and affect. Her behavior is normal. Judgment normal.   Female chaperone present during pelvic exam.  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 She had to do this last year when she was not under my care. Will start her on topical estrogen cream (Premarin 0.5 grams twice weekly).  Follow up in 2 months.  If we continue with pessary, will make sure patient is receiving topical estrogen to strengthen vaginal wall to reduce risk of pessary erosion.  Follow up scheduled for 2 months.  Thomasene Mohair, MD  [facility], [facility] 04/30/2016  4:13 PM

## 2016-05-28 ENCOUNTER — Encounter: Payer: Self-pay | Admitting: Obstetrics and Gynecology

## 2016-05-28 ENCOUNTER — Ambulatory Visit (INDEPENDENT_AMBULATORY_CARE_PROVIDER_SITE_OTHER): Payer: Medicare Other | Admitting: Obstetrics and Gynecology

## 2016-05-28 VITALS — BP 134/84 | Wt 142.0 lb

## 2016-05-28 DIAGNOSIS — N993 Prolapse of vaginal vault after hysterectomy: Secondary | ICD-10-CM

## 2016-05-28 NOTE — Progress Notes (Signed)
  HPI: [name] is a 83 y.o. female who presents today for her pessary follow up and examination related to her pelvic floor weakening.  Pt reports more persistent bulge since her pessary has been out.  Denies vaginal bleeding, urinary retention, difficulty with passing stool.  She denies symptoms of UTI.  Denies hematuria, hematochezia and melena.   PMHx: She  has a past medical history of Heart murmur; History of kidney infection; Hyperlipidemia; Hypertension; LBBB (left bundle branch block); and Syncope and collapse. Also,  has a past surgical history that includes Abdominal hysterectomy., family history includes Hypertension in her mother; Stroke in her mother.,  reports that she has never smoked. She has never used smokeless tobacco. She reports that she does not drink alcohol or use drugs.  She has a current medication list which includes the following prescription(s): amlodipine, aspirin, ezetimibe, and potassium chloride. Also, is allergic to statins.  ROS - per HPI  Objective: BP 134/84   Wt 142 lb (64.4 kg)   BMI 24.37 kg/m  Physical Exam  Constitutional: She appears well-developed and well-nourished. No distress.  Genitourinary:  Genitourinary Comments: Bulge noted to about 2cm past hymenal ring with valsalva.  Speculum exam reveals this to be posterior vaginal wall.  Left posterior vagina near cuff still with 1 x 1.5cm erosion, appears to be healing.no tenderness nor active bleeding.  HENT:  Head: Normocephalic and atraumatic.  Abdominal: Soft. Bowel sounds are normal. She exhibits no distension. There is no tenderness. There is no guarding.    Pessary Care Pessary not re-inserted.  A/P: Concerning symptoms to observe for are counseled to patient. Follow up scheduled for 1-2 months.  Pessary holiday advised.  Will leave out and follow symptoms closely.  Replace in 4 weeks.  Thomasene MohairStephen Donovyn Guidice, MD  [facility], [facility] 05/28/2016  2:21  PM

## 2016-06-06 ENCOUNTER — Ambulatory Visit: Payer: Self-pay | Admitting: Obstetrics and Gynecology

## 2016-07-02 ENCOUNTER — Ambulatory Visit (INDEPENDENT_AMBULATORY_CARE_PROVIDER_SITE_OTHER): Payer: Medicare Other | Admitting: Obstetrics and Gynecology

## 2016-07-02 VITALS — BP 138/88 | Wt 142.0 lb

## 2016-07-02 DIAGNOSIS — N993 Prolapse of vaginal vault after hysterectomy: Secondary | ICD-10-CM

## 2016-07-02 NOTE — Progress Notes (Signed)
  HPI:      [name] is a 83 y.o.  who presents today for her pessary follow up and examination related to her pelvic floor weakening.  Pt reports tolerating not having the pessary well with no vaginal bleeding and no vaginal discharge.  Symptoms of pelvic floor weakening have not worsened. She is voiding and defecating without difficulty. She currently has a #3 ring with support pessary that she is not wearing.  PMHx: She  has a past medical history of Heart murmur; History of kidney infection; Hyperlipidemia; Hypertension; LBBB (left bundle branch block); and Syncope and collapse. Also,  has a past surgical history that includes Abdominal hysterectomy., family history includes Hypertension in her mother; Stroke in her mother.,  reports that she has never smoked. She has never used smokeless tobacco. She reports that she does not drink alcohol or use drugs.  She has a current medication list which includes the following prescription(s): amlodipine, aspirin, ezetimibe, hydrochlorothiazide, and potassium chloride. Also, is allergic to statins.  Review of Systems  Constitutional: Negative.   HENT: Negative.   Eyes: Negative.   Respiratory: Negative.   Cardiovascular: Negative.   Gastrointestinal: Negative.   Genitourinary: Negative.   Musculoskeletal: Negative.   Skin: Negative.   Neurological: Negative.   Psychiatric/Behavioral: Negative.     Objective: BP 138/88   Wt 142 lb (64.4 kg)   BMI 24.37 kg/m  Physical Exam  Constitutional: She is oriented to person, place, and time. She appears well-developed and well-nourished. No distress.  Genitourinary: Pelvic exam was performed with patient supine. There is no rash, tenderness, lesion or injury on the right labia. There is no rash, tenderness, lesion or injury on the left labia.  Genitourinary Comments: Vagina bulging with posterior wall about 2cm past hymenal ring.  Speculum exam with no evidence of erosions or ulcerations.   No tenderness on palpation.  Eyes: EOM are normal. No scleral icterus.  Pulmonary/Chest: Effort normal and breath sounds normal.  Abdominal: Soft. Bowel sounds are normal. She exhibits no distension and no mass. There is no tenderness. There is no rebound and no guarding. No hernia.  Neurological: She is alert and oriented to person, place, and time.  Skin: Skin is warm and dry. No erythema.  Psychiatric: She has a normal mood and affect. Her behavior is normal. Judgment normal.    Pessary Care Pessary removed and cleaned.  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 Plan to leave the pessary out at this time. She may stop using estrogen. If she decides she wants the pessary back, she is instructed to restart the estrogen and make an appointment to have the pessary replaced.   Samantha MohairStephen Caitlin Hillmer, MD  [facility], [facility] 07/02/2016  1:33 PM

## 2016-09-05 ENCOUNTER — Ambulatory Visit: Payer: Medicare Other | Admitting: Obstetrics & Gynecology

## 2016-09-11 ENCOUNTER — Ambulatory Visit (INDEPENDENT_AMBULATORY_CARE_PROVIDER_SITE_OTHER): Payer: Medicare Other | Admitting: Obstetrics & Gynecology

## 2016-09-11 ENCOUNTER — Encounter: Payer: Self-pay | Admitting: Obstetrics & Gynecology

## 2016-09-11 VITALS — BP 130/80 | HR 64 | Ht 64.0 in | Wt 144.0 lb

## 2016-09-11 DIAGNOSIS — N993 Prolapse of vaginal vault after hysterectomy: Secondary | ICD-10-CM | POA: Diagnosis not present

## 2016-09-11 NOTE — Progress Notes (Signed)
  HPI:      [name] is a 83 y.o. who presents today for her pessary follow up and examination related to her pelvic floor weakening.  Pt reports tolerating #3 ring pessary in past well until she was noted to have some vag sx's and bleeding several mos back.  It has been out ever since.  Now her prolapse sx's of pressure and discomfort have returned and gradually worsened.  PMHx: She  has a past medical history of Heart murmur; History of kidney infection; Hyperlipidemia; Hypertension; LBBB (left bundle branch block); and Syncope and collapse. Also,  has a past surgical history that includes Abdominal hysterectomy., family history includes Hypertension in her mother; Stroke in her mother.,  reports that she has never smoked. She has never used smokeless tobacco. She reports that she does not drink alcohol or use drugs.  She has a current medication list which includes the following prescription(s): amlodipine, aspirin, ezetimibe, hydrochlorothiazide, and potassium chloride. Also, is allergic to statins.  Review of Systems  All other systems reviewed and are negative.  Objective: BP 130/80   Pulse 64   Ht 5\' 4"  (1.626 m)   Wt 144 lb (65.3 kg)   BMI 24.72 kg/m  Physical Exam  Vitals reviewed.  A/P: Since last Pessary led to some vag sx's will order and place a #2 ring pessary.  If expulsion then back to a #3. Will call when arrives to schedule with Dr Jean Rosenthal or myself.  A total of 15 minutes were spent face-to-face with the patient during this encounter and over half of that time dealt with counseling and coordination of care.  Samantha Major, MD, [name], [facility] 09/11/2016  4:01 PM

## 2016-10-12 ENCOUNTER — Telehealth: Payer: Self-pay | Admitting: Obstetrics & Gynecology

## 2016-10-12 NOTE — Telephone Encounter (Signed)
Rocco Serene, LPN  Paschal, Sung Amabile  Cc: Nadara Mustard, MD        Pessary has arrived. Please contact pt to schedule apt for placement.    Pt is schedule 10/18/16 with [facility]

## 2016-10-12 NOTE — Telephone Encounter (Signed)
-----   Message from [place], LPN sent at 04/23/8117  9:51 AM EDT ----- Regarding: Pessary Pessary has arrived. Please contact pt to schedule apt for placement.

## 2016-10-18 ENCOUNTER — Ambulatory Visit: Payer: Medicare Other | Admitting: Obstetrics & Gynecology

## 2016-10-19 ENCOUNTER — Ambulatory Visit (INDEPENDENT_AMBULATORY_CARE_PROVIDER_SITE_OTHER): Payer: Medicare Other | Admitting: Obstetrics & Gynecology

## 2016-10-19 ENCOUNTER — Encounter: Payer: Self-pay | Admitting: Obstetrics & Gynecology

## 2016-10-19 VITALS — BP 140/80 | HR 97 | Ht 65.0 in | Wt 147.0 lb

## 2016-10-19 DIAGNOSIS — N816 Rectocele: Secondary | ICD-10-CM | POA: Insufficient documentation

## 2016-10-19 DIAGNOSIS — N993 Prolapse of vaginal vault after hysterectomy: Secondary | ICD-10-CM

## 2016-10-19 NOTE — Progress Notes (Signed)
  HPI:      [name] is a 83 y.o. who presents today for her vaginal vault prolapse and rectocele follow up and examination, related to her pelvic floor weakening.  Pt reports tolerating the pessary well in the past but has not had one in for the last 6 weeks and notes return of pressure, discomfort and bladder changes. She is voiding and defecating without difficulty. She has had pessary in past (ring #3) but it eventually caused bleeding and expulsion.  PMHx: She  has a past medical history of Heart murmur; History of kidney infection; Hyperlipidemia; Hypertension; LBBB (left bundle branch block); and Syncope and collapse. Also,  has a past surgical history that includes Abdominal hysterectomy., family history includes Hypertension in her mother; Stroke in her mother.,  reports that she has never smoked. She has never used smokeless tobacco. She reports that she does not drink alcohol or use drugs.  She has a current medication list which includes the following prescription(s): amlodipine, aspirin, ezetimibe, hydrochlorothiazide, and potassium chloride. Also, is allergic to statins.  Review of Systems  All other systems reviewed and are negative.  Objective: BP 140/80   Pulse 97   Ht  (1.651 m)   Wt 147 lb (66.7 kg)   BMI 24.46 kg/m  Physical Exam  Constitutional: She is oriented to person, place, and time. She appears well-developed and well-nourished. No distress.  Genitourinary: Vagina normal. Pelvic exam was performed with patient supine. There is no rash, tenderness or lesion on the right labia. There is no rash, tenderness or lesion on the left labia. No erythema or bleeding in the vagina.  Genitourinary Comments: Cuff intact/ no lesions Absent uterus and cervix Gr 2 cystocele, Gr 3 rectocele, and vaginal vault prolpase  Abdominal: Soft. She exhibits no distension. There is no tenderness.  Musculoskeletal: Normal range of motion.  Neurological: She is alert and  oriented to person, place, and time. No cranial nerve deficit.  Skin: Skin is warm and dry.  Psychiatric: She has a normal mood and affect.   A/P: Pelvic organ prolapse w rectocele and vag vault prolapse most pronounced and unstable/ untreated at this time  New Pessary was placed today.  Ring #2 (downsize from before). Instructions given for care.  Risks, benefits, alternative treatments all discussed. Concerning symptoms to observe for are counseled to patient. Follow up scheduled for 3 months. A total of 15 minutes were spent face-to-face with the patient during this encounter and over half of that time dealt with counseling and coordination of care.  Annamarie Major, MD, [name], [facility] 10/19/2016  2:10 PM

## 2016-11-29 ENCOUNTER — Ambulatory Visit: Payer: Medicare Other | Admitting: Obstetrics & Gynecology

## 2016-12-14 ENCOUNTER — Ambulatory Visit (INDEPENDENT_AMBULATORY_CARE_PROVIDER_SITE_OTHER): Payer: Medicare Other | Admitting: Obstetrics & Gynecology

## 2016-12-14 ENCOUNTER — Encounter: Payer: Self-pay | Admitting: Obstetrics & Gynecology

## 2016-12-14 VITALS — BP 140/80 | Ht 65.0 in | Wt 148.0 lb

## 2016-12-14 DIAGNOSIS — N993 Prolapse of vaginal vault after hysterectomy: Secondary | ICD-10-CM

## 2016-12-14 NOTE — Progress Notes (Signed)
  HPI:      [name] is a 83 y.o. No obstetric history on file. who presents today for her pessary follow up and examination related to her pelvic floor weakening.  Pt reports tolerating the pessary well with  no vaginal bleeding and  no vaginal discharge.  Symptoms of pelvic floor weakening have greatly improved. She is voiding and defecating without difficulty. She currently has a #2 Ring w Support pessary.  Much improved over size 3 from before.  PMHx: She  has a past medical history of Heart murmur, History of kidney infection, Hyperlipidemia, Hypertension, LBBB (left bundle branch block), and Syncope and collapse. Also,  has a past surgical history that includes Abdominal hysterectomy., family history includes Hypertension in her mother; Stroke in her mother.,  reports that  has never smoked. she has never used smokeless tobacco. She reports that she does not drink alcohol or use drugs.  She has a current medication list which includes the following prescription(s): amlodipine, aspirin, ezetimibe, hydrochlorothiazide, and potassium chloride. Also, is allergic to statins.  Review of Systems  All other systems reviewed and are negative.   Objective: BP 140/80   Ht 5\' 5"  (1.651 m)   Wt 148 lb (67.1 kg)   BMI 24.63 kg/m  Physical Exam  Constitutional: She is oriented to person, place, and time. She appears well-developed and well-nourished. No distress.  Genitourinary: Vagina normal. Pelvic exam was performed with patient supine. There is no rash, tenderness or lesion on the right labia. There is no rash, tenderness or lesion on the left labia. No erythema or bleeding in the vagina.  Genitourinary Comments: Cuff intact/ no lesions  Absent uterus and cervix  Abdominal: Soft. She exhibits no distension. There is no tenderness.  Musculoskeletal: Normal range of motion.  Neurological: She is alert and oriented to person, place, and time. No cranial nerve deficit.  Skin: Skin is  warm and dry.  Psychiatric: She has a normal mood and affect.   Pessary removed and cleaned.  Vagina checked - without erosions - pessary replaced.  1. Prolapse of vaginal vault after hysterectomy 2. Pessary Care  Pessary was cleaned and replaced today. Instructions given for care. Concerning symptoms to observe for are counseled to patient. A total of 15 minutes were spent face-to-face with the patient during this encounter and over half of that time dealt with counseling and coordination of care. Follow up scheduled for 3 months.  Annamarie MajorPaul Tajana Crotteau, MD, [facility]ACOG [facility], [facility] 12/14/2016  3:21 PM

## 2017-02-13 ENCOUNTER — Encounter: Payer: Self-pay | Admitting: Obstetrics & Gynecology

## 2017-02-13 ENCOUNTER — Ambulatory Visit (INDEPENDENT_AMBULATORY_CARE_PROVIDER_SITE_OTHER): Payer: Medicare Other | Admitting: Obstetrics & Gynecology

## 2017-02-13 VITALS — BP 120/70 | HR 85 | Ht 65.0 in | Wt 145.0 lb

## 2017-02-13 DIAGNOSIS — N993 Prolapse of vaginal vault after hysterectomy: Secondary | ICD-10-CM

## 2017-02-13 DIAGNOSIS — N816 Rectocele: Secondary | ICD-10-CM | POA: Diagnosis not present

## 2017-02-13 NOTE — Progress Notes (Signed)
  HPI:      [name] is a 84 y.o. No obstetric history on file. who presents today for her pessary follow up and examination related to her pelvic floor weakening.  Pt reports tolerating the pessary well with  no vaginal bleeding and worsening discharge as time passes from appointment cleaning to the next.  Douch w some help.  Prior use of vag ERT may have helped.  No dryness or pain.  Symptoms of pelvic floor weakening have greatly improved. She is voiding and defecating without difficulty. She currently has a #2 Ring w support pessary.  PMHx: She  has a past medical history of Heart murmur, History of kidney infection, Hyperlipidemia, Hypertension, LBBB (left bundle branch block), and Syncope and collapse. Also,  has a past surgical history that includes Abdominal hysterectomy., family history includes Hypertension in her mother; Stroke in her mother.,  reports that  has never smoked. she has never used smokeless tobacco. She reports that she does not drink alcohol or use drugs.  She has a current medication list which includes the following prescription(s): amlodipine, aspirin, ezetimibe, hydrochlorothiazide, and potassium chloride. Also, is allergic to statins.  Review of Systems  All other systems reviewed and are negative.   Objective: BP 120/70   Pulse 85   Ht 5\' 5"  (1.651 m)   Wt 145 lb (65.8 kg)   BMI 24.13 kg/m  Physical Exam  Constitutional: She is oriented to person, place, and time. She appears well-developed and well-nourished. No distress.  Genitourinary: Vagina normal. Pelvic exam was performed with patient supine. There is no rash, tenderness or lesion on the right labia. There is no rash, tenderness or lesion on the left labia. No erythema or bleeding in the vagina.  Genitourinary Comments: Cuff intact/ no lesions Absent uterus and cervix Gr 3 rectocele Vag tissue prolapse Min atrophy  Abdominal: Soft. She exhibits no distension. There is no tenderness.    Musculoskeletal: Normal range of motion.  Neurological: She is alert and oriented to person, place, and time. No cranial nerve deficit.  Skin: Skin is warm and dry.  Psychiatric: She has a normal mood and affect.   Pessary Care Pessary removed and cleaned.  Vagina checked - without erosions - pessary replaced.  A/P:1. Prolapse of vaginal vault after hysterectomy 2. Rectocele Pessary was cleaned and replaced today. Instructions given for care. Concerning symptoms to observe for are counseled to patient. Follow up scheduled for 2 months (due to worsening discharge as time passes between cleanings).  Also will try vag Premarin twice weekly to see if lessens discharge.  Annamarie MajorPaul Aeric Burnham, MD, [facility]inda FrederickFACOG [facility], [facility] 02/13/2017  4:22 PM

## 2017-04-01 ENCOUNTER — Emergency Department: Payer: Medicare Other

## 2017-04-01 ENCOUNTER — Other Ambulatory Visit: Payer: Self-pay

## 2017-04-01 ENCOUNTER — Emergency Department
Admission: EM | Admit: 2017-04-01 | Discharge: 2017-04-01 | Disposition: A | Discharge status: Home / Self Care | Payer: Medicare Other | Attending: Emergency Medicine | Admitting: Emergency Medicine

## 2017-04-01 ENCOUNTER — Encounter: Payer: Self-pay | Admitting: Emergency Medicine

## 2017-04-01 DIAGNOSIS — Z7982 Long term (current) use of aspirin: Secondary | ICD-10-CM | POA: Diagnosis not present

## 2017-04-01 DIAGNOSIS — R070 Pain in throat: Secondary | ICD-10-CM | POA: Insufficient documentation

## 2017-04-01 DIAGNOSIS — B349 Viral infection, unspecified: Secondary | ICD-10-CM | POA: Insufficient documentation

## 2017-04-01 DIAGNOSIS — R0982 Postnasal drip: Secondary | ICD-10-CM | POA: Diagnosis not present

## 2017-04-01 DIAGNOSIS — R51 Headache: Secondary | ICD-10-CM | POA: Diagnosis present

## 2017-04-01 DIAGNOSIS — Z79899 Other long term (current) drug therapy: Secondary | ICD-10-CM | POA: Insufficient documentation

## 2017-04-01 DIAGNOSIS — R0981 Nasal congestion: Secondary | ICD-10-CM | POA: Diagnosis not present

## 2017-04-01 DIAGNOSIS — R000 Tachycardia, unspecified: Secondary | ICD-10-CM | POA: Insufficient documentation

## 2017-04-01 DIAGNOSIS — J069 Acute upper respiratory infection, unspecified: Secondary | ICD-10-CM

## 2017-04-01 LAB — CBC WITH DIFFERENTIAL/PLATELET
Basophils Absolute: 0.1 10*3/uL (ref 0–0.1)
Basophils Relative: 1 %
Eosinophils Absolute: 0 10*3/uL (ref 0–0.7)
Eosinophils Relative: 0 %
HCT: 38.8 % (ref 35.0–47.0)
HEMOGLOBIN: 13.1 g/dL (ref 12.0–16.0)
LYMPHS PCT: 16 %
Lymphs Abs: 1.5 10*3/uL (ref 1.0–3.6)
MCH: 29.9 pg (ref 26.0–34.0)
MCHC: 33.7 g/dL (ref 32.0–36.0)
MCV: 88.7 fL (ref 80.0–100.0)
MONOS PCT: 9 %
Monocytes Absolute: 0.9 10*3/uL (ref 0.2–0.9)
NEUTROS PCT: 74 %
Neutro Abs: 6.8 10*3/uL — ABNORMAL HIGH (ref 1.4–6.5)
Platelets: 290 10*3/uL (ref 150–440)
RBC: 4.37 MIL/uL (ref 3.80–5.20)
RDW: 13.4 % (ref 11.5–14.5)
WBC: 9.3 10*3/uL (ref 3.6–11.0)

## 2017-04-01 LAB — TROPONIN I: Troponin I: <0.03 ng/mL (ref <0.03)

## 2017-04-01 LAB — COMPREHENSIVE METABOLIC PANEL
ALBUMIN: 4 g/dL (ref 3.5–5.0)
ALK PHOS: 57 U/L (ref 38–126)
ALT: 16 U/L (ref 14–54)
AST: 31 U/L (ref 15–41)
Anion gap: 13 (ref 5–15)
BUN: 9 mg/dL (ref 6–20)
CHLORIDE: 95 mmol/L — AB (ref 101–111)
CO2: 25 mmol/L (ref 22–32)
Calcium: 8.8 mg/dL — ABNORMAL LOW (ref 8.9–10.3)
Creatinine, Ser: 0.86 mg/dL (ref 0.44–1.00)
GFR, EST NON AFRICAN AMERICAN: 59 mL/min — AB (ref >60)
Glucose, Bld: 111 mg/dL — ABNORMAL HIGH (ref 65–99)
POTASSIUM: 3.6 mmol/L (ref 3.5–5.1)
Sodium: 133 mmol/L — ABNORMAL LOW (ref 135–145)
Total Bilirubin: 1 mg/dL (ref 0.3–1.2)
Total Protein: 8.2 g/dL — ABNORMAL HIGH (ref 6.5–8.1)

## 2017-04-01 MED ORDER — SODIUM CHLORIDE 0.9 % IV BOLUS (SEPSIS)
1000.0000 mL | Freq: Once | INTRAVENOUS | Status: AC
  Administered 2017-04-01: 1000 mL via INTRAVENOUS

## 2017-04-01 MED ORDER — IPRATROPIUM BROMIDE 0.06 % NA SOLN: 2.0000 | Freq: Three times a day (TID) | NASAL | 0 refills | Status: DC

## 2017-04-01 NOTE — ED Provider Notes (Signed)
[facility] Emergency Department Provider Note   ____________________________________________   None    (approximate)  I have reviewed the triage vital signs and the nursing notes.   HISTORY  Chief Complaint Facial Pain    HPI Samantha Saunders is a 84 y.o. female complain of sinus congestion/ postnasal drainage, and sore throat for 5 days.  Patient denies fever chills associated this complaint.  Patient denies nausea, vomiting, diabetes.  Patient denies pain with this complaint.  No palliative measures for complaint.  Past Medical History:  Diagnosis Date  . Heart murmur   . History of kidney infection   . Hyperlipidemia   . Hypertension   . LBBB (left bundle branch block)   . Syncope and collapse     Patient Active Problem List   Diagnosis Date Noted  . Rectocele 10/19/2016  . 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 SOB (shortness of breath) 10/23/2013  . Pre-syncope 04/23/2013  . Hypertension     Past Surgical History:  Procedure Laterality Date  . ABDOMINAL HYSTERECTOMY      Prior to Admission medications   Medication Sig Start Date End Date Taking? Authorizing Provider  amLODipine (NORVASC) 10 MG tablet Take 1 tablet (10 mg total) by mouth daily. 04/21/13   Iran Ouch, MD  aspirin 81 MG tablet Take 81 mg by mouth daily.    [provider]  ezetimibe (ZETIA) 10 MG tablet Take 10 mg by mouth daily.    [provider]  hydrochlorothiazide (MICROZIDE) 12.5 MG capsule Take 12.5 mg by mouth daily.    [provider]  potassium chloride (K-DUR) 10 MEQ tablet Take 1 tablet (10 mEq total) by mouth 2 (two) times daily. Patient not taking: Reported on 05/28/2016 11/20/15   Jennye Moccasin, MD    Allergies Statins  Family History  Problem Relation Age of Onset  . Stroke Mother   . Hypertension Mother     Social History Social History   Tobacco Use  . Smoking status: Never Smoker  .  Smokeless tobacco: Never Used  Substance Use Topics  . Alcohol use: No  . Drug use: No    Review of Systems  Constitutional: No fever/chills Eyes: No visual changes. ENT: No sore throat. Cardiovascular: Denies chest pain. Respiratory: Denies shortness of breath. Gastrointestinal: No abdominal pain.  No nausea, no vomiting.  No diarrhea.  No constipation. Genitourinary: Negative for dysuria. Musculoskeletal: Negative for back pain. Skin: Negative for rash. Neurological: Negative for headaches, focal weakness or numbness. Endocrine:Hyperlipidemia and hypertension. Allergic/Immunilogical: Statins. ____________________________________________   PHYSICAL EXAM:  VITAL SIGNS: ED Triage Vitals  Enc Vitals Group     BP 04/01/17 1212 (!) 151/64     Pulse Rate 04/01/17 1212 62     Resp 04/01/17 1212 18     Temp 04/01/17 1212 98 F (36.7 C)     Temp Source 04/01/17 1212 Oral     SpO2 04/01/17 1212 94 %     Weight 04/01/17 1213 140 lb (63.5 kg)     Height 04/01/17 1213 5\' 4"  (1.626 m)     Head Circumference --      Peak Flow --      Pain Score 04/01/17 1213 2     Pain Loc --      Pain Edu? --      Excl. in GC? --     Constitutional: Alert and oriented. Well appearing and in no acute distress. Nose: No  congestion/rhinnorhea. Mouth/Throat: Mucous membranes are moist.  Oropharynx non-erythematous. Neck: No stridor.   Cardiovascular: Tachycardic, regular rhythm. Grossly normal heart sounds.  Good peripheral circulation. Respiratory: Normal respiratory effort.  No retractions. Lungs CTAB. Gastrointestinal: Soft and nontender. No distention. No abdominal bruits. No CVA tenderness. Musculoskeletal: No lower extremity tenderness nor edema.  No joint effusions. Neurologic:  Normal speech and language. No gross focal neurologic deficits are appreciated. No gait instability. Skin:  Skin is warm, dry and intact. No rash noted. Psychiatric: Mood and affect are normal. Speech and  behavior are normal.  ____________________________________________   LABS (all labs ordered are listed, but only abnormal results are displayed)  Labs Reviewed  COMPREHENSIVE METABOLIC PANEL  CBC WITH DIFFERENTIAL/PLATELET  TROPONIN I   ____________________________________________  EKG   ____________________________________________  RADIOLOGY  ED MD interpretation:    Official radiology report(s): No results found.  ____________________________________________   PROCEDURES  Procedure(s) performed:   Procedures  Critical Care performed: No  ____________________________________________   INITIAL IMPRESSION / ASSESSMENT AND PLAN / ED COURSE  As part of my medical decision making, I reviewed the following data within the electronic MEDICAL RECORD NUMBER    Tachycardic with sinus congestion and sore throat.  Patient will be further evaluated by Dr. Lamont Snowballifenbark.      ____________________________________________   FINAL CLINICAL IMPRESSION(S) / ED DIAGNOSES  Final diagnoses:  Tachycardia, unspecified  Congestion of nasal sinus     ED Discharge Orders    None       Note:  This document was prepared using Dragon voice recognition software and may include unintentional dictation errors.    Joni ReiningSmith, Ramiya Delahunty K, PA-C 04/01/17 1322    Jene EveryKinner, Robert, MD 04/01/17 1335

## 2017-04-01 NOTE — Discharge Instructions (Signed)
Fortunately today your blood work, EKG, and chest x-ray were reassuring.  Please make sure you remain well-hydrated and follow-up with your primary care physician as needed.  Return to the emergency department sooner for any concerns.  It was a pleasure to take care of you today, and thank you for coming to our emergency department.  If you have any questions or concerns before leaving please ask the nurse to grab me and I'm more than happy to go through your aftercare instructions again.  If you were prescribed any opioid pain medication today such as Norco, Vicodin, Percocet, morphine, hydrocodone, or oxycodone please make sure you do not drive when you are taking this medication as it can alter your ability to drive safely.  If you have any concerns once you are home that you are not improving or are in fact getting worse before you can make it to your follow-up appointment, please do not hesitate to call 911 and come back for further evaluation.  Merrily Brittle, MD  Results for orders placed or performed during the hospital encounter of 04/01/17  Comprehensive metabolic panel  Result Value Ref Range   Sodium 133 (L) 135 - 145 mmol/L   Potassium 3.6 3.5 - 5.1 mmol/L   Chloride 95 (L) 101 - 111 mmol/L   CO2 25 22 - 32 mmol/L   Glucose, Bld 111 (H) 65 - 99 mg/dL   BUN 9 6 - 20 mg/dL   Creatinine, Ser 1.61 0.44 - 1.00 mg/dL   Calcium 8.8 (L) 8.9 - 10.3 mg/dL   Total Protein 8.2 (H) 6.5 - 8.1 g/dL   Albumin 4.0 3.5 - 5.0 g/dL   AST 31 15 - 41 U/L   ALT 16 14 - 54 U/L   Alkaline Phosphatase 57 38 - 126 U/L   Total Bilirubin 1.0 0.3 - 1.2 mg/dL   GFR calc non Af Amer 59 (L) >60 mL/min   GFR calc Af Amer >60 >60 mL/min   Anion gap 13 5 - 15  CBC with Differential  Result Value Ref Range   WBC 9.3 3.6 - 11.0 K/uL   RBC 4.37 3.80 - 5.20 MIL/uL   Hemoglobin 13.1 12.0 - 16.0 g/dL   HCT 09.6 04.5 - 40.9 %   MCV 88.7 80.0 - 100.0 fL   MCH 29.9 26.0 - 34.0 pg   MCHC 33.7 32.0 - 36.0 g/dL     RDW 81.1 91.4 - 78.2 %   Platelets 290 150 - 440 K/uL   Neutrophils Relative % 74 %   Neutro Abs 6.8 (H) 1.4 - 6.5 K/uL   Lymphocytes Relative 16 %   Lymphs Abs 1.5 1.0 - 3.6 K/uL   Monocytes Relative 9 %   Monocytes Absolute 0.9 0.2 - 0.9 K/uL   Eosinophils Relative 0 %   Eosinophils Absolute 0.0 0 - 0.7 K/uL   Basophils Relative 1 %   Basophils Absolute 0.1 0 - 0.1 K/uL  Troponin I  Result Value Ref Range   Troponin I <0.03 <0.03 ng/mL   Dg Chest 2 View  Result Date: 04/01/2017 CLINICAL DATA:  Cough and sore throat for the past week. EXAM: CHEST - 2 VIEW COMPARISON:  Chest x-ray dated November 20, 2015. FINDINGS: The heart size and mediastinal contours are within normal limits. Normal pulmonary vascularity. Atherosclerotic calcification of the aortic arch. No focal consolidation, pleural effusion, or pneumothorax. No acute osseous abnormality. IMPRESSION: No active cardiopulmonary disease. Electronically Signed   By: Vickki Hearing.D.  On: 04/01/2017 14:39

## 2017-04-01 NOTE — ED Notes (Signed)
Pt assisted to toilet; ambulated without difficulty.

## 2017-04-01 NOTE — ED Notes (Signed)
Patient reports cold symptoms, sore throat, nasal congestion and cough.  Patient denies fever.  Patient is in no obvious distress at this time.  Patient is alert and oriented x 4.

## 2017-04-01 NOTE — ED Notes (Signed)
Pt discharged home after verbalizing understanding of discharge instructions; nad noted. 

## 2017-04-01 NOTE — ED Provider Notes (Signed)
[facility] Emergency Department Provider Note  ____________________________________________   First MD Initiated Contact with Patient 04/01/17 1328     (approximate)  I have reviewed the triage vital signs and the nursing notes.   HISTORY  Chief Complaint Facial Pain   HPI Samantha Saunders is a 84 y.o. female who self presents to the emergency department with several days of sinus congestion, sore throat, and dry cough.  She denies fevers or chills.  Her symptoms have been gradual onset slowly progressive are now mild to moderate severity.  She has taken several over-the-counter medications without relief.  Today she became frustrated at her persistent symptoms so she came to the emergency department.  Her primary concern is the moderate severity pressure in her forehead.  She has no purulent material coming from her nose.  Her pain is moderate severity throbbing aching nonradiating.  Seems to be worse at night and improved throughout the day.  Past Medical History:  Diagnosis Date  . Heart murmur   . History of kidney infection   . Hyperlipidemia   . Hypertension   . LBBB (left bundle branch block)   . Syncope and collapse     Patient Active Problem List   Diagnosis Date Noted  . Rectocele 10/19/2016  . 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 SOB (shortness of breath) 10/23/2013  . Pre-syncope 04/23/2013  . Hypertension     Past Surgical History:  Procedure Laterality Date  . ABDOMINAL HYSTERECTOMY      Prior to Admission medications   Medication Sig Start Date End Date Taking? Authorizing Provider  amLODipine (NORVASC) 10 MG tablet Take 1 tablet (10 mg total) by mouth daily. 04/21/13   Iran OuchArida, Muhammad A, MD  aspirin 81 MG tablet Take 81 mg by mouth daily.    [provider]  ezetimibe (ZETIA) 10 MG tablet Take 10 mg by mouth daily.    [provider]  hydrochlorothiazide (MICROZIDE) 12.5 MG capsule Take 12.5  mg by mouth daily.    [provider]  ipratropium (ATROVENT) 0.06 % nasal spray Place 2 sprays into the nose 3 (three) times daily. 04/01/17 04/01/18  Merrily Brittleifenbark, Flynn Lininger, MD  potassium chloride (K-DUR) 10 MEQ tablet Take 1 tablet (10 mEq total) by mouth 2 (two) times daily. Patient not taking: Reported on 05/28/2016 11/20/15   Jennye MoccasinQuigley, Brian S, MD    Allergies Statins  Family History  Problem Relation Age of Onset  . Stroke Mother   . Hypertension Mother     Social History Social History   Tobacco Use  . Smoking status: Never Smoker  . Smokeless tobacco: Never Used  Substance Use Topics  . Alcohol use: No  . Drug use: No    Review of Systems Constitutional: No fever/chills Eyes: No visual changes. ENT: Positive for sore throat. Cardiovascular: Denies chest pain. Respiratory: Positive for cough Gastrointestinal: No abdominal pain.  No nausea, no vomiting.  No diarrhea.  No constipation. Genitourinary: Negative for dysuria. Musculoskeletal: Negative for back pain. Skin: Negative for rash. Neurological: Negative for headaches, focal weakness or numbness.   ____________________________________________   PHYSICAL EXAM:  VITAL SIGNS: ED Triage Vitals  Enc Vitals Group     BP 04/01/17 1212 (!) 151/64     Pulse Rate 04/01/17 1212 62     Resp 04/01/17 1212 18     Temp 04/01/17 1212 98 F (36.7 C)     Temp Source 04/01/17 1212 Oral  SpO2 04/01/17 1212 94 %     Weight 04/01/17 1213 140 lb (63.5 kg)     Height 04/01/17 1213 5\' 4"  (1.626 m)     Head Circumference --      Peak Flow --      Pain Score 04/01/17 1213 2     Pain Loc --      Pain Edu? --      Excl. in GC? --     Constitutional: Alert and oriented x4 appears somewhat uncomfortable speaks with nasal voice although nontoxic Eyes: PERRL EOMI. Head: Atraumatic. Tender frontal sinuses Nose: No congestion/rhinnorhea. Mouth/Throat: No trismus uvula midline no pharyngeal erythema or exudate Neck: No  stridor.   Cardiovascular: Tachycardic rate, regular rhythm. Grossly normal heart sounds.  Good peripheral circulation. Respiratory: Normal respiratory effort.  No retractions. Lungs CTAB and moving good air Gastrointestinal: Soft nontender Musculoskeletal: No lower extremity edema   Neurologic:  Normal speech and language. No gross focal neurologic deficits are appreciated. Skin:  Skin is warm, dry and intact. No rash noted. Psychiatric: Mood and affect are normal. Speech and behavior are normal.    ____________________________________________   DIFFERENTIAL includes but not limited to  Bacterial sinusitis, viral sinusitis, upper respiratory tract infection, strep pharyngitis, viral pharyngitis, pneumonia ____________________________________________   LABS (all labs ordered are listed, but only abnormal results are displayed)  Labs Reviewed  COMPREHENSIVE METABOLIC PANEL - Abnormal; Notable for the following components:      Result Value   Sodium 133 (*)    Chloride 95 (*)    Glucose, Bld 111 (*)    Calcium 8.8 (*)    Total Protein 8.2 (*)    GFR calc non Af Amer 59 (*)    All other components within normal limits  CBC WITH DIFFERENTIAL/PLATELET - Abnormal; Notable for the following components:   Neutro Abs 6.8 (*)    All other components within normal limits  TROPONIN I    Lab work reviewed by me with no acute disease __________________________________________  EKG  ED ECG REPORT I, Merrily Brittle, the attending physician, personally viewed and interpreted this ECG.  Date: 04/01/2017 EKG Time:  Rate: 153 Rhythm: Wide-complex tachycardia QRS Axis: normal Intervals: normal ST/T Wave abnormalities: normal Narrative Interpretation: no evidence of acute ischemia  ____________________________________________  RADIOLOGY  Test x-ray reviewed by me with no acute disease ____________________________________________   PROCEDURES  Procedure(s) performed:  no  Procedures  Critical Care performed: no  Observation: no ____________________________________________   INITIAL IMPRESSION / ASSESSMENT AND PLAN / ED COURSE  Pertinent labs & imaging results that were available during my care of the patient were reviewed by me and considered in my medical decision making (see chart for details).  The patient was initially triaged to fast track area however she was noted to be significantly tachycardic with a wide-complex so she was sent to my room for evaluation.  On review of patient's old EKGs she has a known left bundle branch block and this tachycardia is clearly not ventricular tachycardia.  She said that her heart rate came up because she felt winded when walking to the previous room.  At rest now her heart rate has come down somewhat.  She has several days of cough shortness of breath along with clear tachycardia and dehydration so I do have some concern for pneumonia.  Fluids and chest x-ray are pending.  The patient's heart rate is come down to 104 she feels significantly improved.  Fortunately her chest x-ray is  negative for infiltrate.  She likely has a significant upper respiratory tract infection.  She is outside any sort of window for influenza treatment.  Her primary concern is sinus congestion so I advised her to use sinus rinses and I will prescribe her Atrovent nasal spray.  Strict return precautions have been given and the patient verbalizes understanding and agreement with the plan.      ____________________________________________   FINAL CLINICAL IMPRESSION(S) / ED DIAGNOSES  Final diagnoses:  Congestion of nasal sinus  Viral upper respiratory tract infection      NEW MEDICATIONS STARTED DURING THIS VISIT:  Discharge Medication List as of 04/01/2017  3:19 PM    START taking these medications   Details  ipratropium (ATROVENT) 0.06 % nasal spray Place 2 sprays into the nose 3 (three) times daily., Starting Mon 04/01/2017,  Until Tue 04/01/2018, Print         Note:  This document was prepared using Dragon voice recognition software and may include unintentional dictation errors.     Merrily Brittle, MD 04/02/17 2253

## 2017-04-01 NOTE — ED Notes (Signed)
Pt moved to Room 18   [facility]

## 2017-04-01 NOTE — ED Triage Notes (Signed)
Sinus congestion, drainage and sore throat x 5 days.

## 2017-04-29 ENCOUNTER — Other Ambulatory Visit: Payer: Self-pay | Admitting: Family Medicine

## 2017-04-29 DIAGNOSIS — Z13820 Encounter for screening for osteoporosis: Secondary | ICD-10-CM

## 2017-05-13 ENCOUNTER — Encounter: Payer: Self-pay | Admitting: Obstetrics & Gynecology

## 2017-05-13 ENCOUNTER — Ambulatory Visit (INDEPENDENT_AMBULATORY_CARE_PROVIDER_SITE_OTHER): Payer: Medicare Other | Admitting: Obstetrics & Gynecology

## 2017-05-13 VITALS — BP 130/80 | Ht 64.0 in | Wt 144.0 lb

## 2017-05-13 DIAGNOSIS — N816 Rectocele: Secondary | ICD-10-CM

## 2017-05-13 DIAGNOSIS — N993 Prolapse of vaginal vault after hysterectomy: Secondary | ICD-10-CM | POA: Diagnosis not present

## 2017-05-13 NOTE — Progress Notes (Signed)
  HPI:      [name] is a 84 y.o. No obstetric history on file. who presents today for her pessary follow up and examination related to her pelvic floor weakening.  Pt reports tolerating the pessary well with  no vaginal bleeding and  no vaginal discharge.  Symptoms of pelvic floor weakening have greatly improved. She is voiding and defecating without difficulty. She currently has a #2 RING pessary.  Vag ERT weekly.  Doing much better w this pessary.  PMHx: She  has a past medical history of Heart murmur, History of kidney infection, Hyperlipidemia, Hypertension, LBBB (left bundle branch block), and Syncope and collapse. Also,  has a past surgical history that includes Abdominal hysterectomy., family history includes Hypertension in her mother; Stroke in her mother.,  reports that she has never smoked. She has never used smokeless tobacco. She reports that she does not drink alcohol or use drugs.  She has a current medication list which includes the following prescription(s): amlodipine, aspirin, ezetimibe, hydrochlorothiazide, ipratropium, and potassium chloride. Also, is allergic to statins.  Review of Systems  All other systems reviewed and are negative.  Objective: BP 130/80   Ht  (1.626 m)   Wt 144 lb (65.3 kg)   BMI 24.72 kg/m  Physical Exam  Constitutional: She is oriented to person, place, and time. She appears well-developed and well-nourished. No distress.  Genitourinary: Vagina normal. Pelvic exam was performed with patient supine. There is no rash, tenderness or lesion on the right labia. There is no rash, tenderness or lesion on the left labia. No erythema or bleeding in the vagina.  Genitourinary Comments: Absent uterus and cervix Gr 3 rectocele Vag tissue prolapse Min atrophy   Abdominal: Soft. She exhibits no distension. There is no tenderness.  Musculoskeletal: Normal range of motion.  Neurological: She is alert and oriented to person, place, and time. No  cranial nerve deficit.  Skin: Skin is warm and dry.  Psychiatric: She has a normal mood and affect.   Pessary Care Pessary removed and cleaned.  Vagina checked - without erosions - pessary replaced.  A/P: Pessary was cleaned and replaced today. Instructions given for care. Concerning symptoms to observe for are counseled to patient. Follow up scheduled for 3 months.  A total of 15 minutes were spent face-to-face with the patient during this encounter and over half of that time dealt with counseling and coordination of care.  Annamarie Major, MD, [name], [facility] 05/13/2017  1:42 PM

## 2017-08-12 ENCOUNTER — Encounter: Payer: Self-pay | Admitting: Obstetrics & Gynecology

## 2017-08-12 ENCOUNTER — Ambulatory Visit (INDEPENDENT_AMBULATORY_CARE_PROVIDER_SITE_OTHER): Payer: Medicare Other | Admitting: Obstetrics & Gynecology

## 2017-08-12 VITALS — BP 130/70 | Ht 65.0 in | Wt 148.0 lb

## 2017-08-12 DIAGNOSIS — N993 Prolapse of vaginal vault after hysterectomy: Secondary | ICD-10-CM | POA: Diagnosis not present

## 2017-08-12 DIAGNOSIS — N816 Rectocele: Secondary | ICD-10-CM | POA: Diagnosis not present

## 2017-08-12 NOTE — Progress Notes (Signed)
  HPI:      Samantha Saunders is a 84 y.o.  who presents today for her pessary follow up and examination related to her pelvic floor weakening.  Pt reports tolerating the pessary well with  scant vaginal bleeding noted only today and no vaginal discharge.  Symptoms of pelvic floor weakening have greatly improved. She is voiding and defecating without difficulty. She currently has a #2 ring pessary.  PMHx: She  has a past medical history of Heart murmur, History of kidney infection, Hyperlipidemia, Hypertension, LBBB (left bundle branch block), and Syncope and collapse. Also,  has a past surgical history that includes Abdominal hysterectomy., family history includes Hypertension in her mother; Stroke in her mother.,  reports that she has never smoked. She has never used smokeless tobacco. She reports that she does not drink alcohol or use drugs.  She has a current medication list which includes the following prescription(s): amlodipine, aspirin, ezetimibe, hydrochlorothiazide, ipratropium, and potassium chloride. Also, is allergic to statins.  Review of Systems  All other systems reviewed and are negative.   Objective: BP 130/70   Ht 5\' 5"  (1.651 m)   Wt 148 lb (67.1 kg)   BMI 24.63 kg/m  Physical Exam  Constitutional: She is oriented to person, place, and time. She appears well-developed and well-nourished. No distress.  Genitourinary: Vagina normal. Pelvic exam was performed with patient supine. There is no rash, tenderness or lesion on the right labia. There is no rash, tenderness or lesion on the left labia. No erythema or bleeding in the vagina.  Genitourinary Comments: Cuff intact/ no lesions Rectocele and prolapsing vag tissues Absent uterus and cervix  HENT:  Head: Normocephalic and atraumatic.  Nose: Nose normal.  Mouth/Throat: Oropharynx is clear and moist.  Abdominal: Soft. She exhibits no distension. There is no tenderness.  Musculoskeletal: Normal range of motion.    Neurological: She is alert and oriented to person, place, and time. No cranial nerve deficit.  Skin: Skin is warm and dry.  Psychiatric: She has a normal mood and affect.   Pessary Care Pessary removed and cleaned.  Vagina checked - without erosions - pessary replaced.  A/P:1. Prolapse of vaginal vault after hysterectomy 2. Rectocele Pessary was cleaned and replaced today. Instructions given for care. Concerning symptoms to observe for are counseled to patient. Follow up scheduled for 3 months.  A total of 15 minutes were spent face-to-face with the patient during this encounter and over half of that time dealt with counseling and coordination of care.  Annamarie MajorPaul Harris, MD, [facility]inda FrederickFACOG [facility], [facility] 08/12/2017  1:52 PM

## 2017-09-14 ENCOUNTER — Emergency Department
Admission: EM | Admit: 2017-09-14 | Discharge: 2017-09-14 | Disposition: A | Discharge status: Home / Self Care | Payer: Medicare Other | Attending: Emergency Medicine | Admitting: Emergency Medicine

## 2017-09-14 ENCOUNTER — Other Ambulatory Visit: Payer: Self-pay

## 2017-09-14 ENCOUNTER — Encounter: Payer: Self-pay | Admitting: Emergency Medicine

## 2017-09-14 ENCOUNTER — Emergency Department: Payer: Medicare Other

## 2017-09-14 DIAGNOSIS — E876 Hypokalemia: Secondary | ICD-10-CM | POA: Insufficient documentation

## 2017-09-14 DIAGNOSIS — R51 Headache: Secondary | ICD-10-CM | POA: Diagnosis present

## 2017-09-14 DIAGNOSIS — Z7982 Long term (current) use of aspirin: Secondary | ICD-10-CM | POA: Insufficient documentation

## 2017-09-14 DIAGNOSIS — I10 Essential (primary) hypertension: Secondary | ICD-10-CM | POA: Diagnosis not present

## 2017-09-14 DIAGNOSIS — N390 Urinary tract infection, site not specified: Secondary | ICD-10-CM

## 2017-09-14 DIAGNOSIS — R519 Headache, unspecified: Secondary | ICD-10-CM

## 2017-09-14 LAB — CBC WITH DIFFERENTIAL/PLATELET
BASOS PCT: 1 %
Basophils Absolute: 0.1 10*3/uL (ref 0–0.1)
Eosinophils Absolute: 0.1 10*3/uL (ref 0–0.7)
Eosinophils Relative: 2 %
HEMATOCRIT: 37.9 % (ref 35.0–47.0)
Hemoglobin: 13.1 g/dL (ref 12.0–16.0)
Lymphocytes Relative: 34 %
Lymphs Abs: 2 10*3/uL (ref 1.0–3.6)
MCH: 30.5 pg (ref 26.0–34.0)
MCHC: 34.6 g/dL (ref 32.0–36.0)
MCV: 87.9 fL (ref 80.0–100.0)
MONO ABS: 0.7 10*3/uL (ref 0.2–0.9)
MONOS PCT: 13 %
NEUTROS ABS: 3 10*3/uL (ref 1.4–6.5)
Neutrophils Relative %: 50 %
Platelets: 241 10*3/uL (ref 150–440)
RBC: 4.31 MIL/uL (ref 3.80–5.20)
RDW: 13.3 % (ref 11.5–14.5)
WBC: 5.9 10*3/uL (ref 3.6–11.0)

## 2017-09-14 LAB — BASIC METABOLIC PANEL
Anion gap: 11 (ref 5–15)
BUN: 12 mg/dL (ref 8–23)
CALCIUM: 9 mg/dL (ref 8.9–10.3)
CHLORIDE: 89 mmol/L — AB (ref 98–111)
CO2: 29 mmol/L (ref 22–32)
Creatinine, Ser: 0.98 mg/dL (ref 0.44–1.00)
GFR calc non Af Amer: 50 mL/min — ABNORMAL LOW (ref >60)
GFR, EST AFRICAN AMERICAN: 58 mL/min — AB (ref >60)
Glucose, Bld: 123 mg/dL — ABNORMAL HIGH (ref 70–99)
Potassium: 2.6 mmol/L — CL (ref 3.5–5.1)
Sodium: 129 mmol/L — ABNORMAL LOW (ref 135–145)

## 2017-09-14 LAB — URINALYSIS, COMPLETE (UACMP) WITH MICROSCOPIC
Bilirubin Urine: NEGATIVE
Glucose, UA: NEGATIVE mg/dL
Ketones, ur: NEGATIVE mg/dL
Nitrite: NEGATIVE
PROTEIN: NEGATIVE mg/dL
SPECIFIC GRAVITY, URINE: 1.002 — AB (ref 1.005–1.030)
pH: 7 (ref 5.0–8.0)

## 2017-09-14 LAB — SEDIMENTATION RATE: SED RATE: 57 mm/h — AB (ref 0–30)

## 2017-09-14 MED ORDER — BUTALBITAL-APAP-CAFFEINE 50-325-40 MG PO TABS
1.0000 | ORAL_TABLET | Freq: Once | ORAL | Status: AC
  Administered 2017-09-14: 1 via ORAL
  Filled 2017-09-14: qty 1

## 2017-09-14 MED ORDER — POTASSIUM CHLORIDE ER 10 MEQ PO TBCR: 40.0000 meq | EXTENDED_RELEASE_TABLET | Freq: Two times a day (BID) | ORAL | 0 refills | Status: DC

## 2017-09-14 MED ORDER — CEPHALEXIN 500 MG PO CAPS
500.0000 mg | ORAL_CAPSULE | Freq: Once | ORAL | Status: AC
  Administered 2017-09-14: 500 mg via ORAL
  Filled 2017-09-14: qty 1

## 2017-09-14 MED ORDER — BUTALBITAL-APAP-CAFFEINE 50-325-40 MG PO TABS
ORAL_TABLET | ORAL | Status: AC
  Filled 2017-09-14: qty 1

## 2017-09-14 MED ORDER — POTASSIUM CHLORIDE CRYS ER 20 MEQ PO TBCR
40.0000 meq | EXTENDED_RELEASE_TABLET | Freq: Once | ORAL | Status: AC
  Administered 2017-09-14: 40 meq via ORAL
  Filled 2017-09-14: qty 2

## 2017-09-14 MED ORDER — CEPHALEXIN 500 MG PO CAPS: 500.0000 mg | ORAL_CAPSULE | Freq: Two times a day (BID) | ORAL | 0 refills | Status: AC

## 2017-09-14 MED ORDER — SODIUM CHLORIDE 0.9 % IV BOLUS
500.0000 mL | Freq: Once | INTRAVENOUS | Status: AC
  Administered 2017-09-14: 500 mL via INTRAVENOUS

## 2017-09-14 NOTE — ED Provider Notes (Signed)
[facility] Emergency Department Provider Note ____________________________________________   First MD Initiated Contact with Patient 09/14/17 1630     (approximate)  I have reviewed the triage vital signs and the nursing notes.   HISTORY  Chief Complaint Headache  HPI Samantha Saunders is a 84 y.o. female history of hypertension, hyper lipidemia, kidney infection who was presented to the emergency department today with a frontal headache.  He says that the headache has been ongoing since yesterday.  She says the headache started mildly and then progressed yesterday.  She says that she took a Tylenol yesterday which relieve her pain.  However, she woke up this morning again with a frontal headache and it has been a 5 out of 10 throughout the day.  She denies nausea.  Says that she is a baseline photophobia.  Says the headache is frontal and is not isolated to the temples.  No vision change.  No fever.  Denies any burning with urination.  Patient have a history of migraine headaches.  Concerned about a stroke because of frequency of strokes in her family.  Says that she thought her symptoms may be related to her blood pressure so she take an additional 10 mg of amlodipine today.   Past Medical History:  Diagnosis Date  . Heart murmur   . History of kidney infection   . Hyperlipidemia   . Hypertension   . LBBB (left bundle branch block)   . Syncope and collapse     Patient Active Problem List   Diagnosis Date Noted  . Rectocele 10/19/2016  . 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 SOB (shortness of breath) 10/23/2013  . Pre-syncope 04/23/2013  . Hypertension     Past Surgical History:  Procedure Laterality Date  . ABDOMINAL HYSTERECTOMY      Prior to Admission medications   Medication Sig Start Date End Date Taking? Authorizing Provider  amLODipine (NORVASC) 10 MG tablet Take 1 tablet (10 mg total) by mouth daily. 04/21/13   Iran Ouch, MD  aspirin 81 MG tablet Take 81 mg by mouth daily.    [provider]  ezetimibe (ZETIA) 10 MG tablet Take 10 mg by mouth daily.    [provider]  hydrochlorothiazide (MICROZIDE) 12.5 MG capsule Take 12.5 mg by mouth daily.    [provider]  ipratropium (ATROVENT) 0.06 % nasal spray Place 2 sprays into the nose 3 (three) times daily. 04/01/17 04/01/18  Merrily Brittle, MD  potassium chloride (K-DUR) 10 MEQ tablet Take 1 tablet (10 mEq total) by mouth 2 (two) times daily. 11/20/15   Jennye Moccasin, MD    Allergies Statins  Family History  Problem Relation Age of Onset  . Stroke Mother   . Hypertension Mother     Social History Social History   Tobacco Use  . Smoking status: Never Smoker  . Smokeless tobacco: Never Used  Substance Use Topics  . Alcohol use: No  . Drug use: No    Review of Systems  Constitutional: No fever/chills Eyes: No visual changes. ENT: No sore throat. Cardiovascular: Denies chest pain. Respiratory: Denies shortness of breath. Gastrointestinal: No abdominal pain.  No nausea, no vomiting.  No diarrhea.  No constipation. Genitourinary: Negative for dysuria. Musculoskeletal: Negative for back pain. Skin: Negative for rash. Neurological: Negative for focal weakness or numbness.   ____________________________________________   PHYSICAL EXAM:  VITAL SIGNS: ED Triage Vitals  Enc Vitals Group  BP 09/14/17 1627 (!) 129/59     Pulse Rate 09/14/17 1625 (!) 105     Resp 09/14/17 1625 16     Temp 09/14/17 1625 98.4 F (36.9 C)     Temp Source 09/14/17 1625 Oral     SpO2 09/14/17 1625 98 %     Weight 09/14/17 1626 140 lb (63.5 kg)     Height 09/14/17 1626 5\' 4"  (1.626 m)     Head Circumference --      Peak Flow --      Pain Score 09/14/17 1626 10     Pain Loc --      Pain Edu? --      Excl. in GC? --     Constitutional: Alert and oriented. Well appearing and in no acute distress. Eyes: Conjunctivae  are normal.  Head: Atraumatic.  No tenderness palpation or nodularity over the distribution of the temporal arteries, bilaterally. Nose: No congestion/rhinnorhea. Mouth/Throat: Mucous membranes are moist.  Neck: No stridor.   Cardiovascular: Normal rate, regular rhythm. Grossly normal heart sounds.   Respiratory: Normal respiratory effort.  No retractions. Lungs CTAB. Gastrointestinal: Soft and nontender. No distention.  Musculoskeletal: No lower extremity tenderness nor edema.  No joint effusions. Neurologic:  Normal speech and language. No gross focal neurologic deficits are appreciated. Skin:  Skin is warm, dry and intact. No rash noted. Psychiatric: Mood and affect are normal. Speech and behavior are normal.  ____________________________________________   LABS (all labs ordered are listed, but only abnormal results are displayed)  Labs Reviewed  BASIC METABOLIC PANEL - Abnormal; Notable for the following components:      Result Value   Sodium 129 (*)    Potassium 2.6 (*)    Chloride 89 (*)    Glucose, Bld 123 (*)    GFR calc non Af Amer 50 (*)    GFR calc Af Amer 58 (*)    All other components within normal limits  URINALYSIS, COMPLETE (UACMP) WITH MICROSCOPIC - Abnormal; Notable for the following components:   Color, Urine YELLOW (*)    APPearance HAZY (*)    Specific Gravity, Urine 1.002 (*)    Hgb urine dipstick MODERATE (*)    Leukocytes, UA LARGE (*)    Bacteria, UA RARE (*)    All other components within normal limits  SEDIMENTATION RATE - Abnormal; Notable for the following components:   Sed Rate 57 (*)    All other components within normal limits  URINE CULTURE  CBC WITH DIFFERENTIAL/PLATELET   ____________________________________________  EKG   ____________________________________________  RADIOLOGY  Finding on the patient's CT of the brain. ____________________________________________   PROCEDURES  Procedure(s) performed:    Procedures  Critical Care performed:   ____________________________________________   INITIAL IMPRESSION / ASSESSMENT AND PLAN / ED COURSE  Pertinent labs & imaging results that were available during my care of the patient were reviewed by me and considered in my medical decision making (see chart for details).  Differential diagnosis includes, but is not limited to, intracranial hemorrhage, meningitis/encephalitis, previous head trauma, cavernous venous thrombosis, tension headache, temporal arteritis, migraine or migraine equivalent, idiopathic intracranial hypertension, and non-specific headache. As part of my medical decision making, I reviewed the following data within the electronic MEDICAL RECORD NUMBER Notes from prior ED visits  ----------------------------------------- 7:03 PM on 09/14/2017 -----------------------------------------  Headache is resolved after fluids and Fioricet.  Patient found to have UTI as well as low potassium.  Sed rate elevated.  Low suspicion for giant  cell arteritis.  Multiple other more common findings that cause weakness and headaches such as the low potassium and UTI, respectively.  No tenderness over the distribution of the temporal arteries.  No nodularity.  Patient to be treated with potassium as well as Keflex.  She is understanding of the diagnosis as well as treatment and willing to comply. ____________________________________________   FINAL CLINICAL IMPRESSION(S) / ED DIAGNOSES  UTI.  Hypokalemia.  NEW MEDICATIONS STARTED DURING THIS VISIT:  New Prescriptions   No medications on file     Note:  This document was prepared using Dragon voice recognition software and may include unintentional dictation errors.     Myrna BlazerSchaevitz, David Matthew, MD 09/14/17 [phone]

## 2017-09-14 NOTE — ED Notes (Signed)
Patient transported to CT 

## 2017-09-14 NOTE — ED Notes (Signed)
Pt ambulated to restroom without difficulty

## 2017-09-14 NOTE — ED Triage Notes (Signed)
States yesterday felt bad and was just laying around the bed all day.  This morning felt better and took an extra blood pressure pill, because she felt her blood pressure was up.  Arrives to ED today, with c/o headache (all day).  C/O pain to forehead.

## 2017-09-16 LAB — URINE CULTURE

## 2017-10-15 ENCOUNTER — Ambulatory Visit (INDEPENDENT_AMBULATORY_CARE_PROVIDER_SITE_OTHER): Payer: Medicare Other | Admitting: Obstetrics and Gynecology

## 2017-10-15 ENCOUNTER — Encounter: Payer: Self-pay | Admitting: Obstetrics and Gynecology

## 2017-10-15 VITALS — BP 124/78 | Wt 141.0 lb

## 2017-10-15 DIAGNOSIS — N993 Prolapse of vaginal vault after hysterectomy: Secondary | ICD-10-CM

## 2017-10-15 DIAGNOSIS — N816 Rectocele: Secondary | ICD-10-CM | POA: Diagnosis not present

## 2017-10-15 NOTE — Progress Notes (Signed)
  HPI:      [name] is a 84 y.o. who presents today for a work in appointment for recent vaginal bleeding.  Pt reports tolerating the pessary well with vaginal bleeding noted on 2 different days last week.  She noted spotting when wiping with the toilet tissue.  She has no bleeding today.  She returned to clinic, as instructed previously for bleeding.  Symptoms of pelvic floor weakening have greatly improved. She is voiding and defecating without difficulty. She currently has a #2 ring pessary.  Of note, she has recently been having daily headaches for which her primary care provider is trying to have her see a neurologist to have evaluated.  She denies focal weakness and symptoms today.  With her pessary, she is using no topical estrogen currently.  PMHx: She  has a past medical history of Heart murmur, History of kidney infection, Hyperlipidemia, Hypertension, LBBB (left bundle branch block), and Syncope and collapse. Also,  has a past surgical history that includes Abdominal hysterectomy., family history includes Hypertension in her mother; Stroke in her mother.,  reports that she has never smoked. She has never used smokeless tobacco. She reports that she does not drink alcohol or use drugs.  She has a current medication list which includes the following prescription(s): amlodipine, aspirin, ezetimibe, hydrochlorothiazide, ipratropium, and potassium chloride. Also, is allergic to statins.  Review of Systems  Constitutional: Negative.   Genitourinary: Negative.        See HPI    Objective: BP 124/78   Wt 141 lb (64 kg)   BMI 24.20 kg/m  Physical Exam  Constitutional: She appears well-developed and well-nourished. No distress.  Genitourinary: Pelvic exam was performed with patient supine. There is no rash, tenderness, lesion or injury on the right labia. There is no rash, tenderness, lesion or injury on the left labia.  Genitourinary Comments: Vagina without ulcerations or erosions.   There is an area at the vaginal cuff that is somewhat irritated appearing.  However, this area is not actively bleeding.  There are no areas noted of active bleeding.  On bimanual there are no palpated ulcerations or erosions and no tenderness.  Eyes: Conjunctivae are normal. No scleral icterus.  Abdominal: She exhibits no mass. There is no tenderness. There is no guarding.  Musculoskeletal: Normal range of motion. She exhibits no edema.  Psychiatric: She has a normal mood and affect. Her behavior is normal. Judgment normal.   Pessary Care Pessary removed and cleaned.  Vagina checked - without erosions - pessary replaced.  Female chaperone present for pelvic exam:   A/P: 1. Prolapse of vaginal vault after hysterectomy 2. Rectocele  Pessary was cleaned and replaced today. Instructions given for care. Concerning symptoms to observe for are counseled to patient. Follow up scheduled for 1 month. I believe she would benefit from topical estrogen continuously.  However, until her neurologic issue is evaluated and determined, I believe it would be safest to not utilize topical estrogen.  If necessary, we discussed having her take a pessary holiday, and she is okay with this idea, if needed.  A total of 15 minutes were spent face-to-face with the patient during this encounter and over half of that time dealt with counseling and coordination of care of her vaginal vault prolapse after hysterectomy and rectocele.  Thomasene Mohair, MD  [facility], [facility] 10/15/2017  5:18 PM

## 2017-11-14 ENCOUNTER — Encounter: Payer: Self-pay | Admitting: Obstetrics & Gynecology

## 2017-11-14 ENCOUNTER — Ambulatory Visit (INDEPENDENT_AMBULATORY_CARE_PROVIDER_SITE_OTHER): Payer: Medicare Other | Admitting: Obstetrics & Gynecology

## 2017-11-14 VITALS — BP 140/80 | Ht 66.0 in | Wt 136.0 lb

## 2017-11-14 DIAGNOSIS — N816 Rectocele: Secondary | ICD-10-CM

## 2017-11-14 DIAGNOSIS — N993 Prolapse of vaginal vault after hysterectomy: Secondary | ICD-10-CM | POA: Diagnosis not present

## 2017-11-14 NOTE — Patient Instructions (Signed)
Pessary #2, hold and bring back if symptoms return

## 2017-11-14 NOTE — Progress Notes (Signed)
  HPI:      [name] is a 84 y.o. No obstetric history on file. who presents today for her pessary follow up and examination related to her pelvic floor weakening.  Pt reports tolerating the pessary well with intermittent vaginal bleeding and no vaginal discharge.  Symptoms of pelvic floor weakening have greatly improved when wearing the pessary.   She feels the sx's of bleeding outweigh the perceived benefits of wearing the pessary and would like a holiday from it. She is voiding and defecating without difficulty. She currently has a RING #2 pessary.  PMHx: She  has a past medical history of Heart murmur, History of kidney infection, Hyperlipidemia, Hypertension, LBBB (left bundle branch block), and Syncope and collapse. Also,  has a past surgical history that includes Abdominal hysterectomy., family history includes Hypertension in her mother; Stroke in her mother.,  reports that she has never smoked. She has never used smokeless tobacco. She reports that she does not drink alcohol or use drugs.  She has a current medication list which includes the following prescription(s): amlodipine, aspirin, ezetimibe, hydrochlorothiazide, ipratropium, and potassium chloride. Also, is allergic to statins.  Review of Systems  All other systems reviewed and are negative.   Objective: BP 140/80   Ht 5\' 6"  (1.676 m)   Wt 136 lb (61.7 kg)   BMI 21.95 kg/m  Physical Exam  Constitutional: She is oriented to person, place, and time. She appears well-developed and well-nourished. No distress.  Genitourinary: Vagina normal. Pelvic exam was performed with patient supine. There is no rash, tenderness or lesion on the right labia. There is no rash, tenderness or lesion on the left labia. No erythema or bleeding in the vagina.  Genitourinary Comments: Cuff intact/ no lesions   No erosions or ulcerations Absent uterus and cervix  HENT:  Head: Normocephalic and atraumatic.  Nose: Nose normal.  Mouth/Throat:  Oropharynx is clear and moist.  Abdominal: Soft. She exhibits no distension. There is no tenderness.  Musculoskeletal: Normal range of motion.  Neurological: She is alert and oriented to person, place, and time. No cranial nerve deficit.  Skin: Skin is warm and dry.  Psychiatric: She has a normal mood and affect.   Pessary Care Pessary removed and cleaned.  Vagina checked - without erosions.  A/P:1. Prolapse of vaginal vault after hysterectomy 2. Rectocele Pessary was cleaned and not replaced today. Instructions given for return and replacement based on sx's Resume twice weekly est vag therapy to prevent future vaginal weakening and bleeding sx's. Concerning symptoms to observe for are counseled to patient. Follow up prn.  A total of 15 minutes were spent face-to-face with the patient during this encounter and over half of that time dealt with counseling and coordination of care.  Annamarie Major, MD, [name], [facility] 11/14/2017  1:36 PM

## 2017-11-22 ENCOUNTER — Ambulatory Visit (INDEPENDENT_AMBULATORY_CARE_PROVIDER_SITE_OTHER): Payer: Medicare Other | Admitting: Obstetrics and Gynecology

## 2017-11-22 ENCOUNTER — Encounter: Payer: Self-pay | Admitting: Obstetrics and Gynecology

## 2017-11-22 VITALS — BP 138/88 | Wt 136.0 lb

## 2017-11-22 DIAGNOSIS — N993 Prolapse of vaginal vault after hysterectomy: Secondary | ICD-10-CM | POA: Diagnosis not present

## 2017-11-22 DIAGNOSIS — N816 Rectocele: Secondary | ICD-10-CM | POA: Diagnosis not present

## 2017-11-22 NOTE — Progress Notes (Signed)
  HPI:      [name] is a 84 y.o. female who presents today for her pessary follow up and examination related to her pelvic floor weakening.  Pt reports discomfort since having her pessary removed last week.  She feels extra pressure and would like to have the pessary replaced. She denies vaginal bleeding. She has not started taking topical estrogen because she was told by a family member who is a nurse that it could cause cancer.   Since her last visit with me she has had an MRI, which was negative for an etiology. She was started on a new medication, which she states has helped her greatly with her migraines.    PMHx: She  has a past medical history of Heart murmur, History of kidney infection, Hyperlipidemia, Hypertension, LBBB (left bundle branch block), and Syncope and collapse. Also,  has a past surgical history that includes Abdominal hysterectomy., family history includes Hypertension in her mother; Stroke in her mother.,  reports that she has never smoked. She has never used smokeless tobacco. She reports that she does not drink alcohol or use drugs.  She has a current medication list which includes the following prescription(s): amlodipine, aspirin, ezetimibe, hydrochlorothiazide, ipratropium, and potassium chloride. Also, is allergic to statins.  Review of Systems  Constitutional: Negative.   HENT: Negative.   Eyes: Negative.   Respiratory: Negative.   Cardiovascular: Negative.   Gastrointestinal: Negative.   Genitourinary: Negative.   Musculoskeletal: Negative.   Skin: Negative.   Neurological: Negative.   Psychiatric/Behavioral: Negative.     Objective: BP 138/88   Wt 136 lb (61.7 kg)   BMI 21.95 kg/m  Physical Exam  Constitutional: She is oriented to person, place, and time. She appears well-developed and well-nourished.  Genitourinary: Vagina normal. Pelvic exam was performed with patient supine. There is no rash, tenderness or lesion on the right labia. There  is no rash, tenderness or lesion on the left labia.  Genitourinary Comments: Vagina without lesions, erosions, and ulcerations. Bimanual is confirmatory. Pessary placed without difficulty  HENT:  Head: Normocephalic and atraumatic.  Eyes: Conjunctivae are normal. No scleral icterus.  Abdominal: Soft. She exhibits no distension and no mass. There is no tenderness. There is no rebound and no guarding.  Musculoskeletal: Normal range of motion. She exhibits no edema.  Neurological: She is alert and oriented to person, place, and time.  Psychiatric: She has a normal mood and affect. Her behavior is normal. Judgment normal.  Speech is a bit delayed. She seem to be word finding, but does eventually come up with the words she is looking for.    Female chaperone present for pelvic exam:   Pessary Care Vagina checked - without erosions - pessary replaced.  A/P:  Pessary was replaced today after a 1-week holiday. Instructions given for care. Concerning symptoms to observe for are counseled to patient. Follow up scheduled for 2 months. I reiterated the safety of topical estrogen and how it might benefit her in her situation where she presents with frequent bleeding.  She declines.   A total of 15 minutes were spent face-to-face with the patient during this encounter and over half of that time dealt with counseling and coordination of care.  Thomasene Mohair, MD  [facility], [facility] 11/22/2017  11:37 AM

## 2017-12-18 ENCOUNTER — Telehealth: Payer: Self-pay | Admitting: Obstetrics & Gynecology

## 2017-12-18 NOTE — Telephone Encounter (Signed)
Patient is calling due to her pessary fell out. Patient reports she lost her pessary. Patient would another pessary ordered please

## 2017-12-25 NOTE — Telephone Encounter (Signed)
Patient was advise pessary was ordered on Monday 12/23/17 and will be called as soon as the device comes in.

## 2017-12-26 ENCOUNTER — Ambulatory Visit: Payer: Medicare Other | Admitting: Obstetrics and Gynecology

## 2017-12-26 NOTE — Telephone Encounter (Signed)
Patient is schedule 12/27/17 with [facility] in [place] office

## 2017-12-26 NOTE — Telephone Encounter (Signed)
Pessary has arrived. Please contact patient to schedule apt for insertion.

## 2017-12-27 ENCOUNTER — Ambulatory Visit (INDEPENDENT_AMBULATORY_CARE_PROVIDER_SITE_OTHER): Payer: Medicare Other | Admitting: Obstetrics and Gynecology

## 2017-12-27 ENCOUNTER — Encounter: Payer: Self-pay | Admitting: Obstetrics and Gynecology

## 2017-12-27 VITALS — BP 122/60 | HR 72 | Wt 137.0 lb

## 2017-12-27 DIAGNOSIS — N816 Rectocele: Secondary | ICD-10-CM | POA: Diagnosis not present

## 2017-12-27 DIAGNOSIS — N993 Prolapse of vaginal vault after hysterectomy: Secondary | ICD-10-CM | POA: Diagnosis not present

## 2017-12-27 NOTE — Progress Notes (Signed)
HPI:      [name] is a 84 y.o. female who presents today for her pessary follow up and examination related to her pelvic floor weakening.  Pt reports tolerating the pessary well with no vaginal bleeding and no vaginal discharge.  She states that several weeks ago her noted increasing bulging of her vagina.  She checked and believes her pessary fell out.  Symptoms of pelvic floor weakening have returned with the loss of her pessary. She is voiding and defecating without difficulty. She currently has a 2 ring with support pessary.  PMHx: She  has a past medical history of Heart murmur, History of kidney infection, Hyperlipidemia, Hypertension, LBBB (left bundle branch block), and Syncope and collapse. Also,  has a past surgical history that includes Abdominal hysterectomy., family history includes Hypertension in her mother; Stroke in her mother.,  reports that she has never smoked. She has never used smokeless tobacco. She reports that she does not drink alcohol or use drugs.  She has a current medication list which includes the following prescription(s): amlodipine, aspirin, ezetimibe, hydrochlorothiazide, ipratropium, and potassium chloride. Also, is allergic to statins.  Review of Systems  Constitutional: Negative.   HENT: Negative.   Eyes: Negative.   Respiratory: Negative.   Cardiovascular: Negative.   Gastrointestinal: Negative.   Genitourinary: Negative.   Musculoskeletal: Negative.   Skin: Negative.   Neurological: Negative.   Psychiatric/Behavioral: Negative.     Objective: BP 122/60   Pulse 72   Wt 137 lb (62.1 kg)   BMI 22.11 kg/m  Physical Exam Constitutional:      General: She is not in acute distress. Genitourinary:     Pelvic exam was performed with patient supine.     Vulva, inguinal canal and vagina normal.     No vulval lesion, tenderness, ulcerations or rash noted.     No signs of labial injury.     No posterior fourchette injury or lesion present.     Cervix is absent.     Uterus is absent.     Genitourinary Comments: No Lesions noted.   Pessary coated with Estrace and placed without difficulty. No erosions or ulcerations noted.  No bleeding noted.  HENT:     Head: Normocephalic and atraumatic.  Eyes:     General: No scleral icterus.    Conjunctiva/sclera: Conjunctivae normal.  Abdominal:     General: There is no distension.     Palpations: There is no mass.     Tenderness: There is no abdominal tenderness. There is no guarding or rebound.     Hernia: There is no hernia in the right inguinal area.  Musculoskeletal: Normal range of motion.        General: No swelling.  Neurological:     General: No focal deficit present.     Mental Status: She is alert.     Cranial Nerves: No cranial nerve deficit.  Skin:    General: Skin is warm and dry.  Psychiatric:        Mood and Affect: Mood normal.        Behavior: Behavior normal.        Judgment: Judgment normal.   Female chaperone present for pelvic exam:   Pessary Care Vagina checked - without erosions - pessary placed.  A/P: New Pessary placed today, #2 ring with support  Instructions given for care. Concerning symptoms to observe for are counseled to patient. Follow up scheduled for 2 months.  A total of  15 minutes were spent face-to-face with the patient during this encounter and over half of that time dealt with counseling and coordination of care of her pessary placement  Thomasene MohairStephen Jackson, MD  [facility], [facility] 12/27/2017  3:15 PM

## 2018-01-09 IMAGING — CR DG CHEST 2V
1 series · 2 of 2 positions shown · non-contrast
Comparison: 03/31/2013

CLINICAL DATA: Headache today

EXAM:
CHEST  2 VIEW

[Series 1: dg chest 2 view · 0.14mm/px · 2 of 2 slices shown]
[im 1/2]
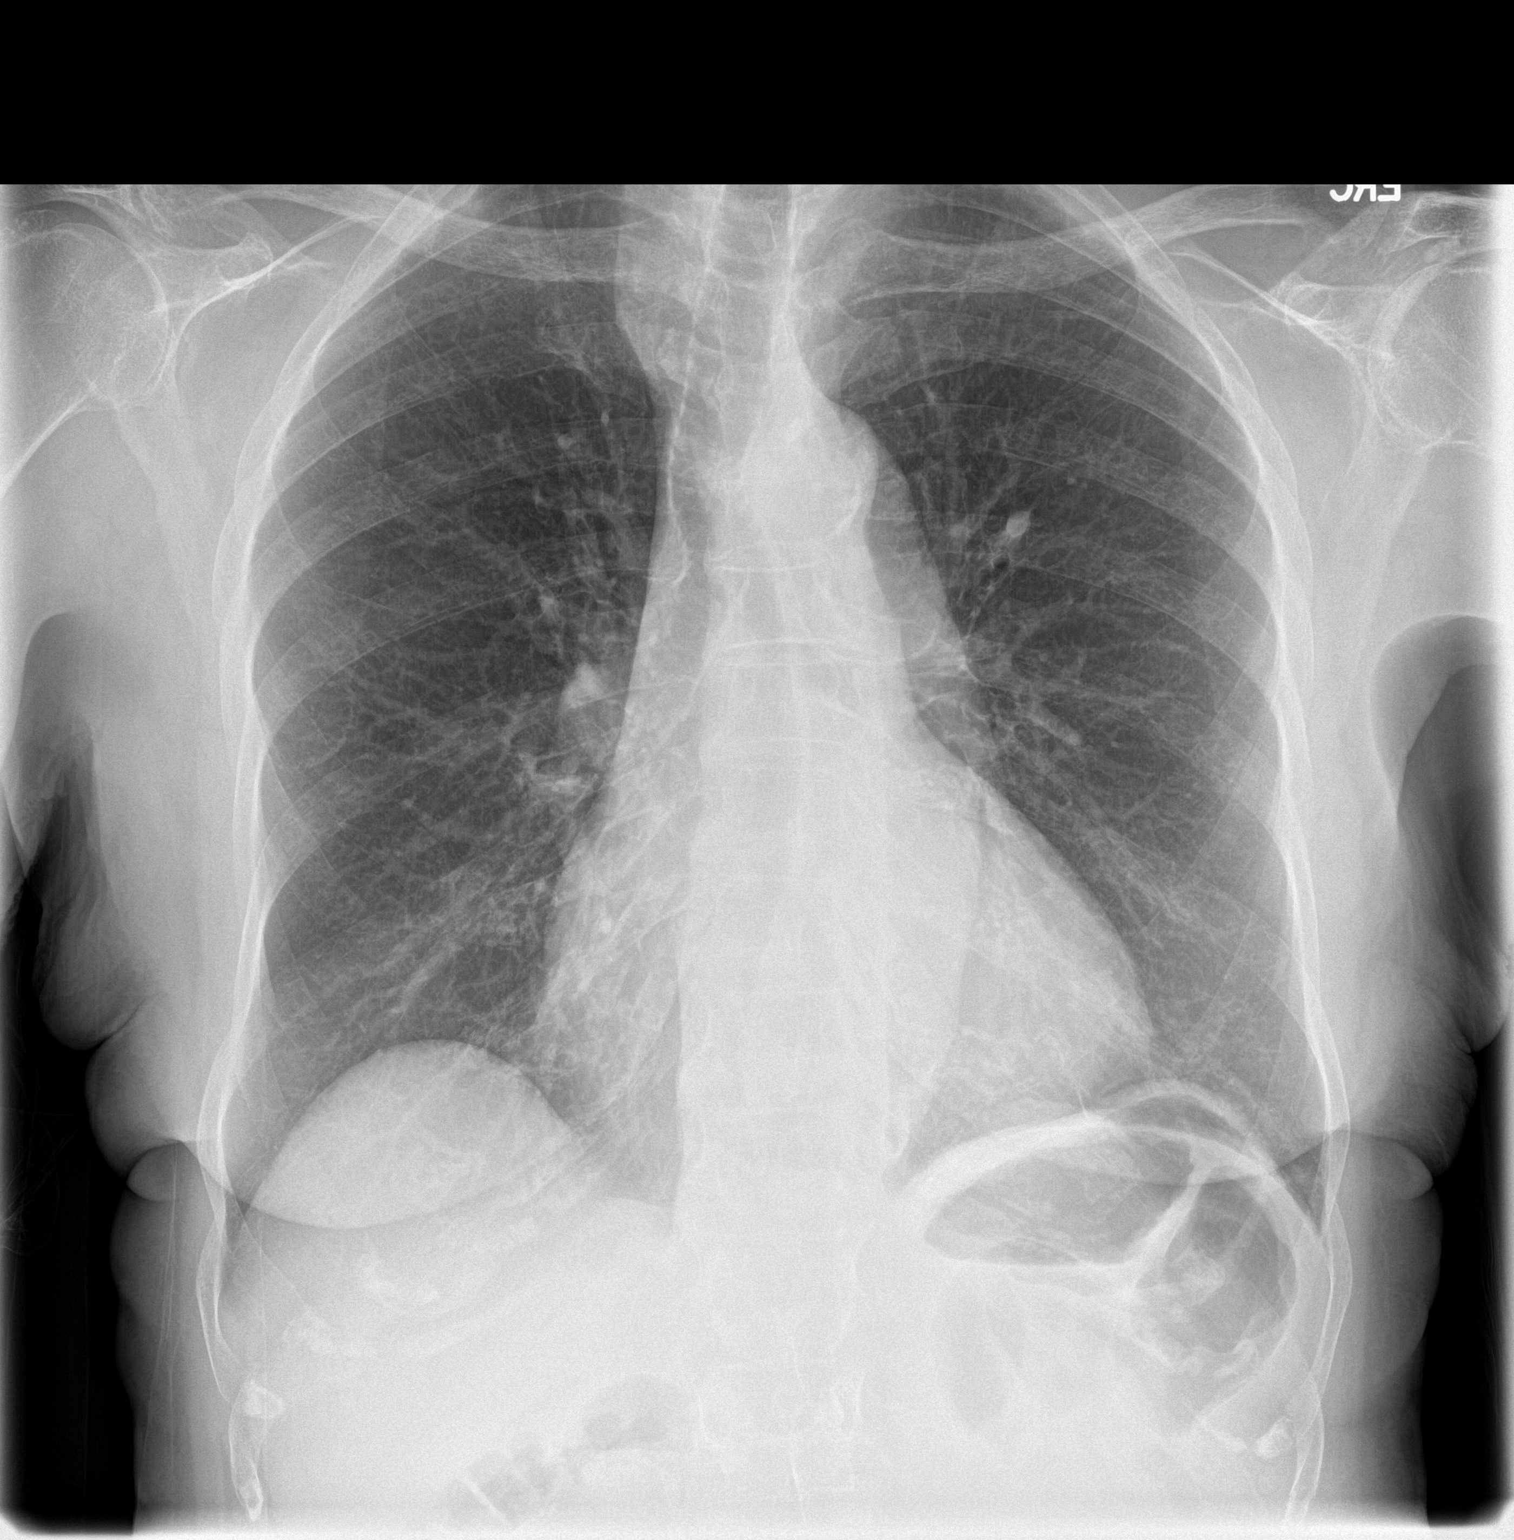
[im 2/2]
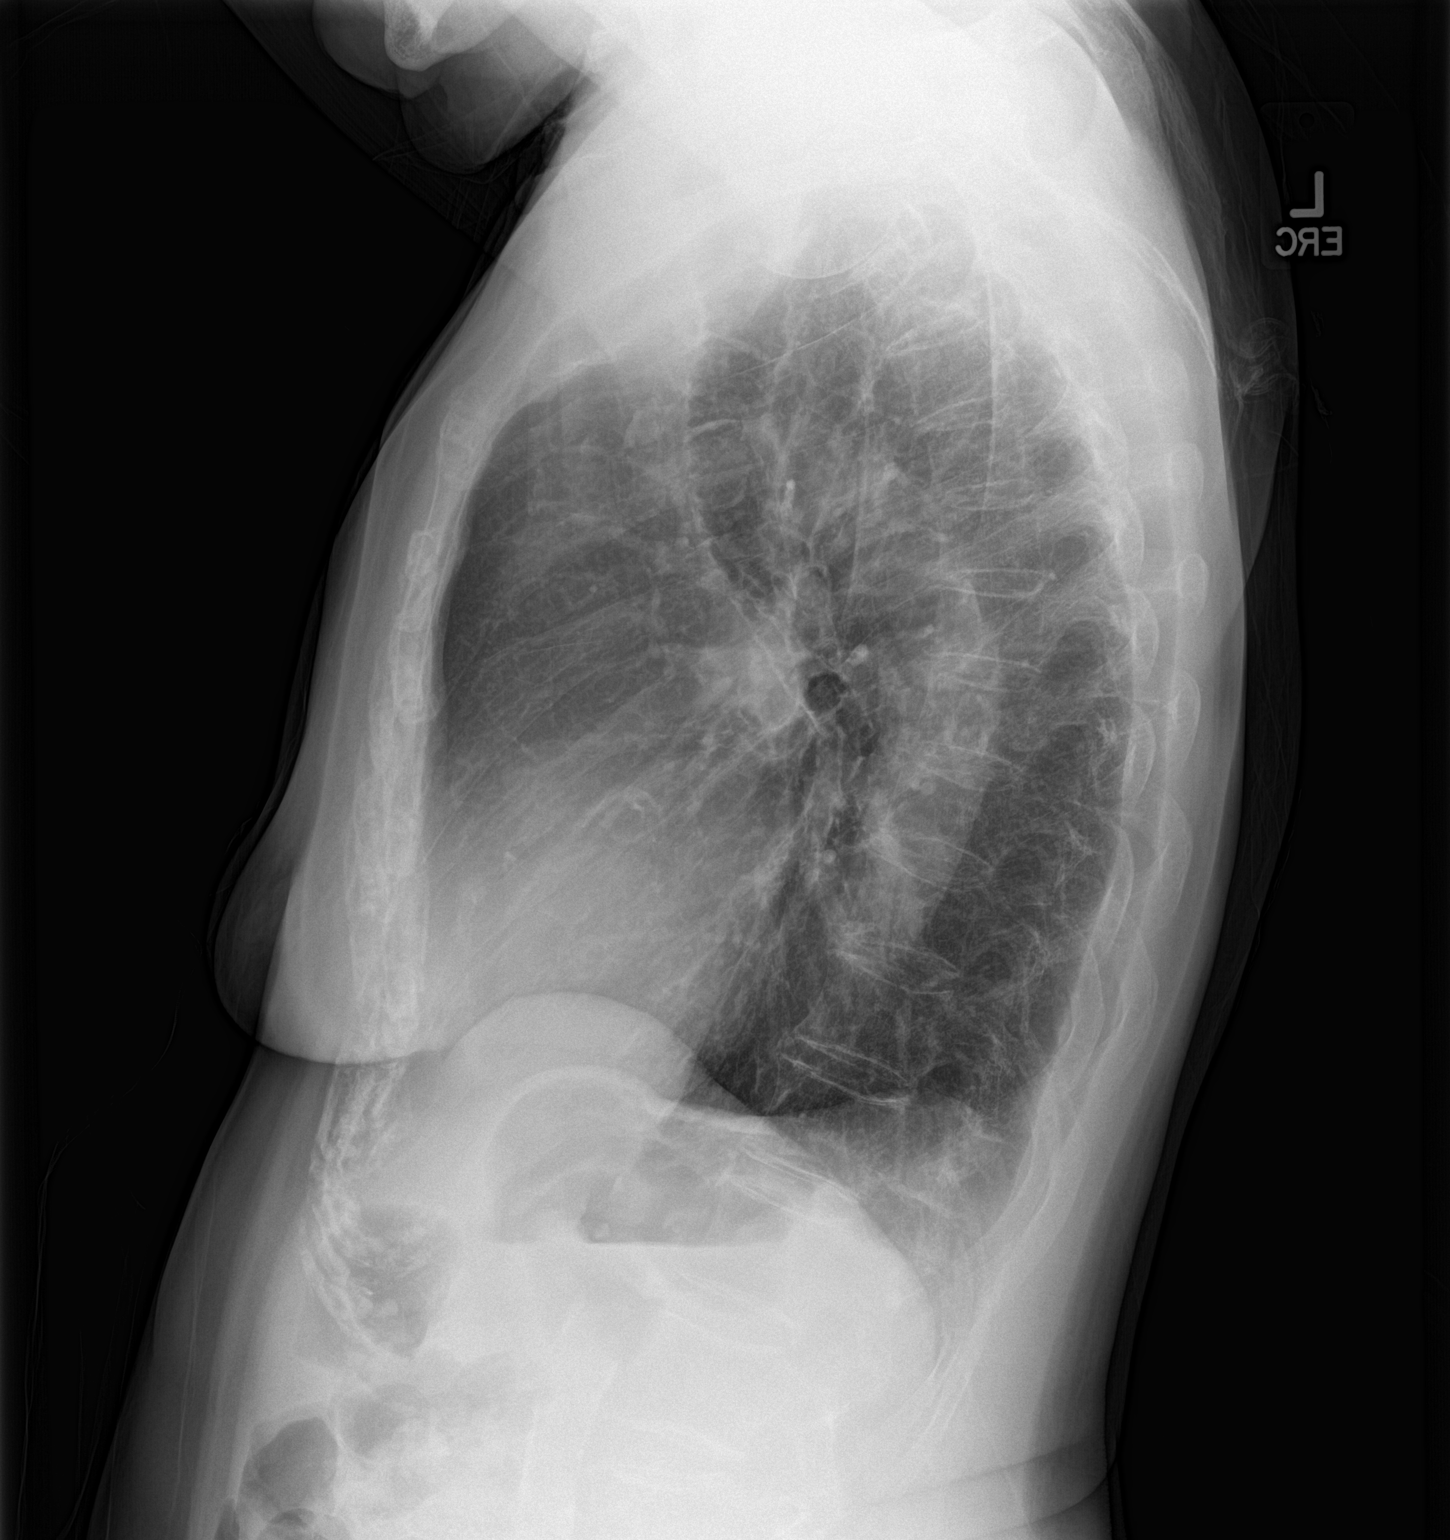

[2 of 2 positions shown; findings below may reference images not displayed]

FINDINGS: The cardiac silhouette, mediastinal and hilar contours are within
normal limits and stable. Stable tortuosity, ectasia and
calcification of the thoracic aorta. Suspect emphysematous changes
but no acute pulmonary findings. No pleural effusion. The bony
thorax is intact.
IMPRESSION: No acute cardiopulmonary findings.

## 2018-01-09 IMAGING — CT CT HEAD W/O CM
3 series · 14 of 44 positions shown, 16 images · non-contrast
Comparison: Head CT 03/31/2013.

CLINICAL DATA: Headache since this afternoon.

EXAM:
CT HEAD WITHOUT CONTRAST
TECHNIQUE: Contiguous axial images were obtained from the base of the skull
through the vertex without intravenous contrast.

[Series 2: head wo · axial · 0.47mm/px · z∈[-113,-3]mm · 8 of 27 slices shown, 10 images]
[im 3/27  brain]
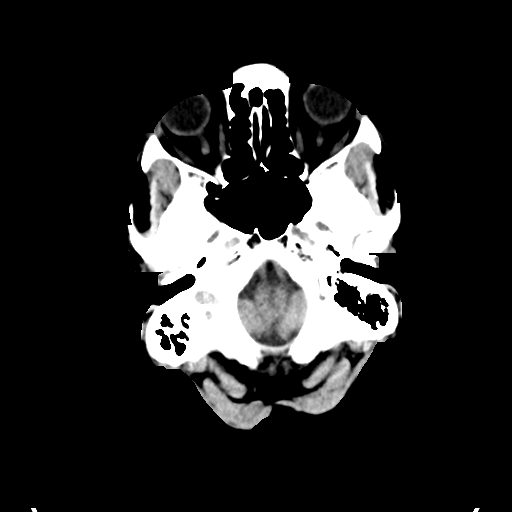
[im 3/27  bone]
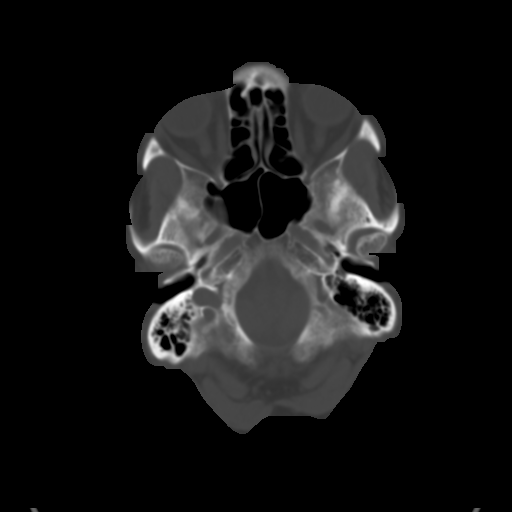
[im 6/27  brain]
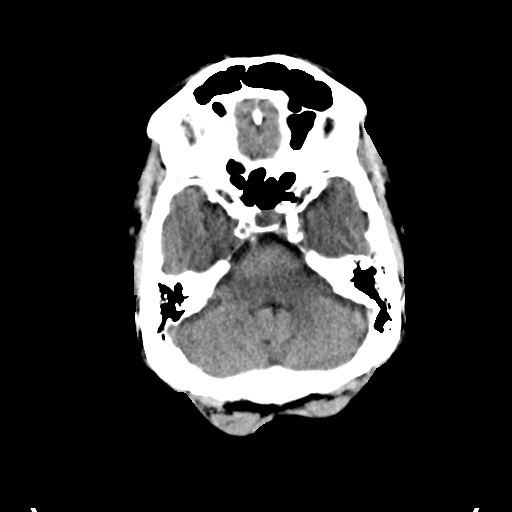
[im 9/27  brain]
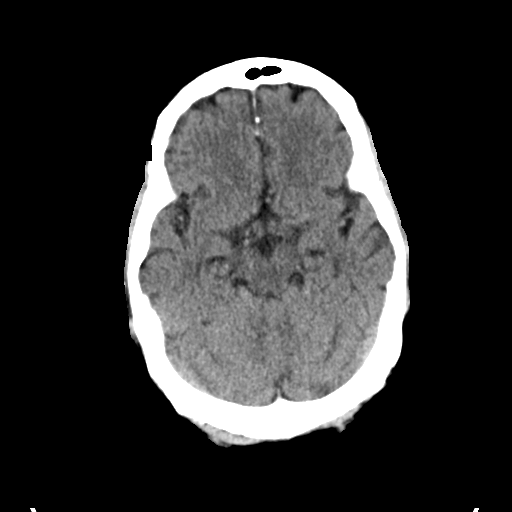
[im 12/27  brain]
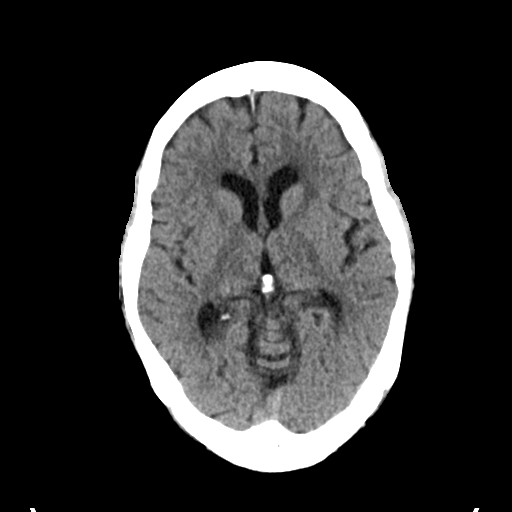
[im 16/27  brain]
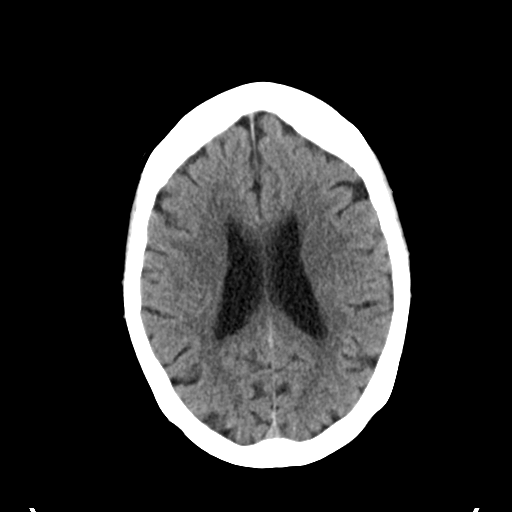
[im 16/27  bone]
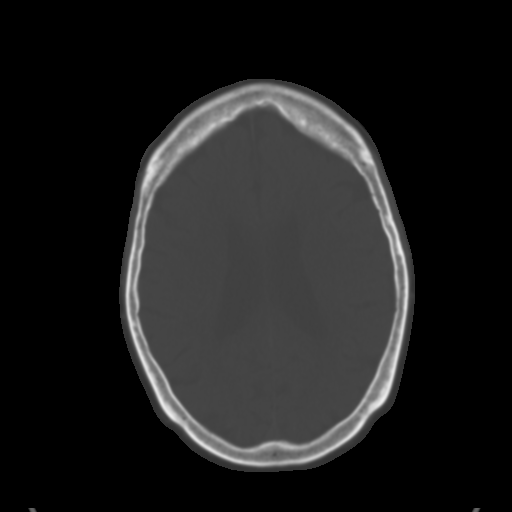
[im 19/27  brain]
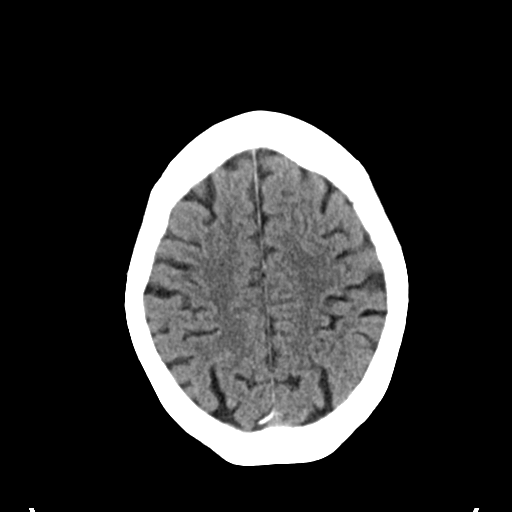
[im 22/27  brain]
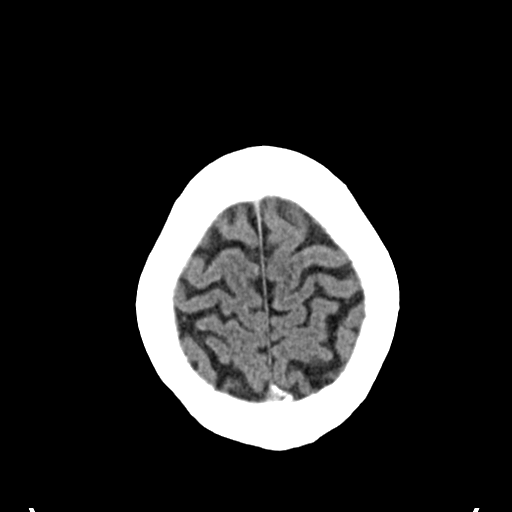
[im 25/27  brain]
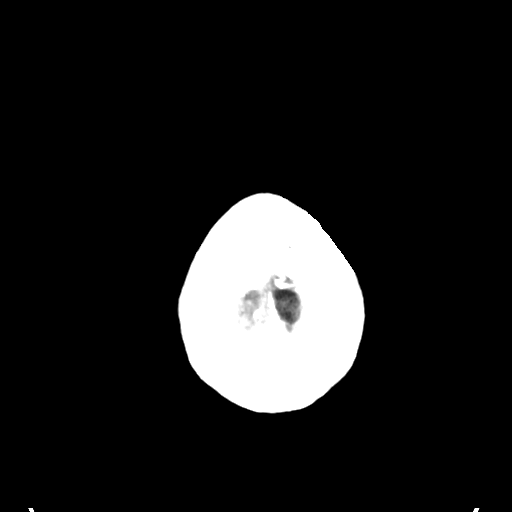

[Series 4: coronal soft tissue · coronal · 0.27mm/px · 3 of 64 slices shown]
[im 22/64  brain]
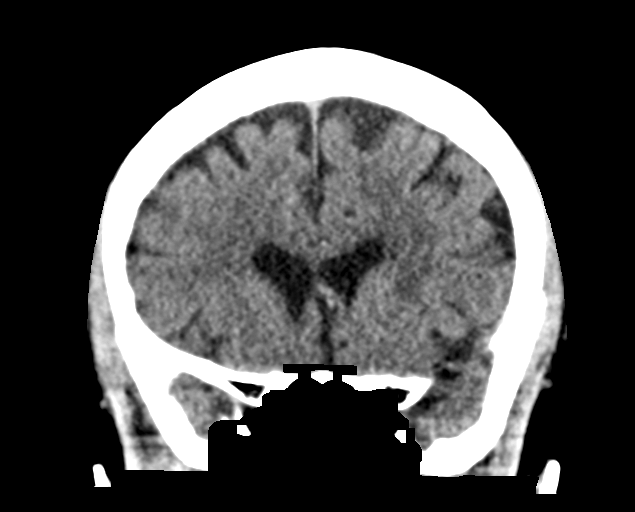
[im 29/64  brain]
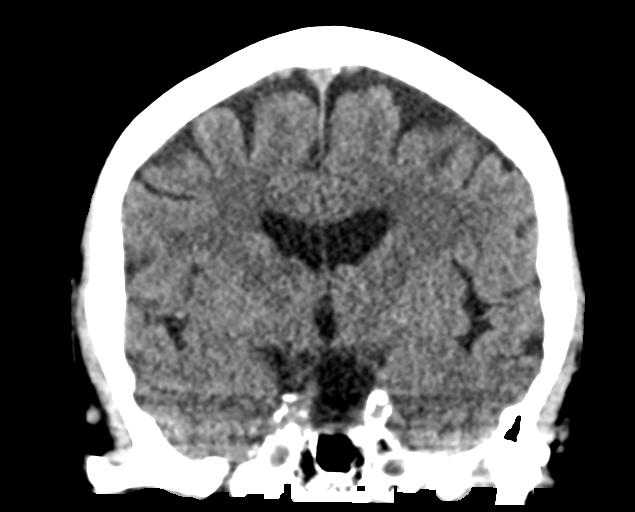
[im 36/64  brain]
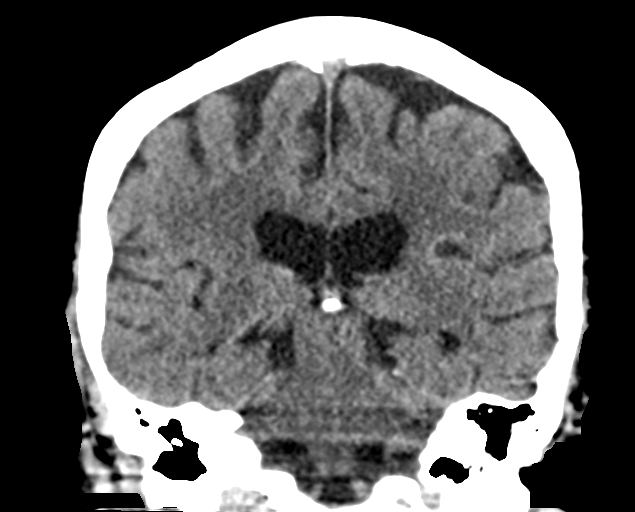

[Series 5: sagittal soft tissue · sagittal · 0.27mm/px · 3 of 49 slices shown]
[im 17/49  brain]
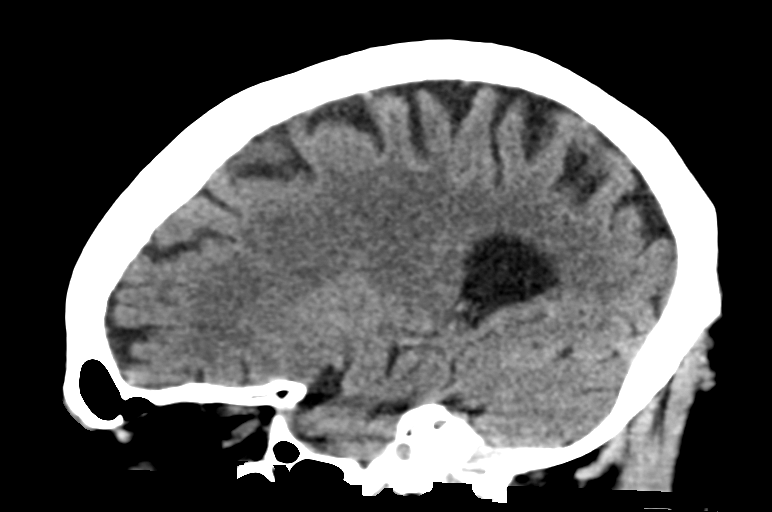
[im 25/49  brain]
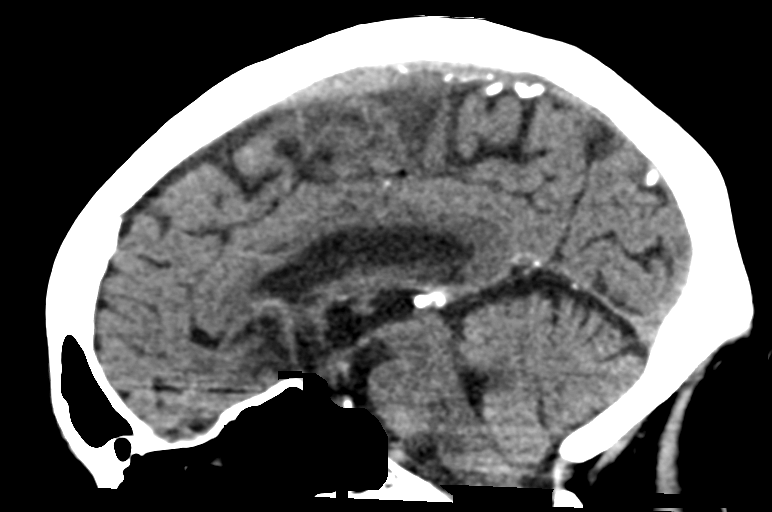
[im 33/49  brain]
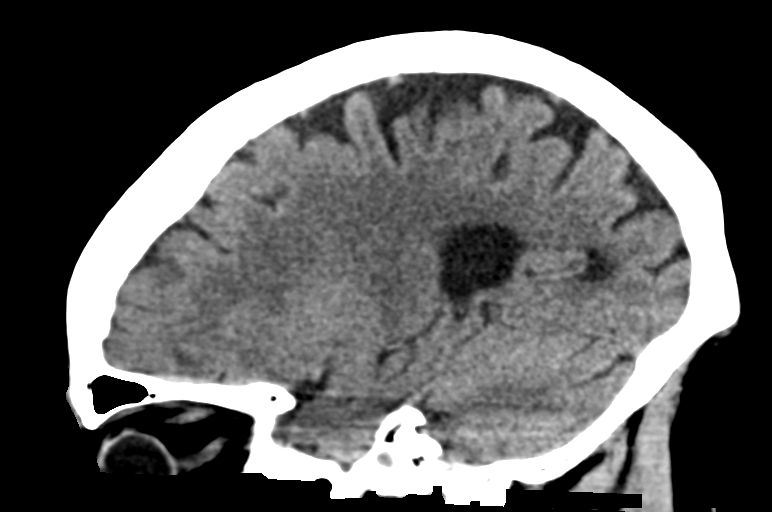

[14 of 44 positions shown; findings below may reference images not displayed]

FINDINGS: Brain: Stable age related cerebral atrophy, ventriculomegaly and
periventricular white matter disease. Remote lacunar type basal
ganglia infarcts and remote basal ganglia calcifications. No
extra-axial fluid collections are identified. No CT findings for
acute hemispheric infarction or intracranial hemorrhage. No mass
lesions. The brainstem and cerebellum are normal.

Vascular: Stable vascular calcifications. No definite aneurysm or
hyperdense vessels.

Skull: No skull fracture or bone lesions.

Sinuses/Orbits: The paranasal sinuses and mastoid air cells are
clear. The globes are intact.

Other: No scout lesions or hematoma.
IMPRESSION: Stable CT appearance of brain when compared to prior examination. No
acute intracranial findings or skull fracture.

Stable vascular calcifications.

## 2018-02-24 ENCOUNTER — Encounter: Payer: Self-pay | Admitting: Obstetrics & Gynecology

## 2018-02-24 ENCOUNTER — Ambulatory Visit (INDEPENDENT_AMBULATORY_CARE_PROVIDER_SITE_OTHER): Payer: Medicare Other | Admitting: Obstetrics & Gynecology

## 2018-02-24 VITALS — BP 132/80 | Ht 64.0 in | Wt 132.0 lb

## 2018-02-24 DIAGNOSIS — N993 Prolapse of vaginal vault after hysterectomy: Secondary | ICD-10-CM

## 2018-02-24 DIAGNOSIS — N816 Rectocele: Secondary | ICD-10-CM

## 2018-02-24 NOTE — Progress Notes (Signed)
  HPI:      [name] is a 85 y.o. No obstetric history on file. who presents today for her pessary follow up and examination related to her pelvic floor weakening.  Pt reports tolerating the pessary well with  no vaginal bleeding and  no vaginal discharge.  Symptoms of pelvic floor weakening have greatly improved. She is voiding and defecating without difficulty. She currently has a RING 2 pessary.  PMHx: She  has a past medical history of Heart murmur, History of kidney infection, Hyperlipidemia, Hypertension, LBBB (left bundle branch block), and Syncope and collapse. Also,  has a past surgical history that includes Abdominal hysterectomy., family history includes Hypertension in her mother; Stroke in her mother.,  reports that she has never smoked. She has never used smokeless tobacco. She reports that she does not drink alcohol or use drugs.  She has a current medication list which includes the following prescription(s): amlodipine, aspirin, ezetimibe, hydrochlorothiazide, ipratropium, and potassium chloride. Also, is allergic to statins.  Review of Systems  All other systems reviewed and are negative.  Objective: BP 132/80   Ht 5\' 4"  (1.626 m)   Wt 132 lb (59.9 kg)   BMI 22.66 kg/m  Physical Exam Constitutional:      General: She is not in acute distress.    Appearance: She is well-developed.  Genitourinary:     Pelvic exam was performed with patient supine.     Vagina normal.     No vaginal erythema or bleeding.     Genitourinary Comments: Cuff intact/ no lesions Gr 3 rectocele Vag tissue prolapse Min atrophy   Absent uterus and cervix  HENT:     Head: Normocephalic and atraumatic.     Nose: Nose normal.  Abdominal:     General: There is no distension.     Palpations: Abdomen is soft.     Tenderness: There is no abdominal tenderness.  Musculoskeletal: Normal range of motion.  Neurological:     Mental Status: She is alert and oriented to person, place, and time.     Cranial Nerves: No cranial nerve deficit.  Skin:    General: Skin is warm and dry.   Pessary Care Pessary removed and cleaned.  Vagina checked - without erosions - pessary replaced.  A/P: 1. Prolapse of vaginal vault after hysterectomy 2. Rectocele  Pessary was cleaned and replaced today. Instructions given for care. Concerning symptoms to observe for are counseled to patient. Follow up scheduled for 10-11 weeks (always has d/c if awaits 12 weeks).  A total of 15 minutes were spent face-to-face with the patient during this encounter and over half of that time dealt with counseling and coordination of care.  Annamarie Major, MD, [name], [facility] 02/24/2018  1:45 PM

## 2018-02-27 ENCOUNTER — Ambulatory Visit: Payer: Medicare Other | Admitting: Obstetrics & Gynecology

## 2018-05-26 ENCOUNTER — Ambulatory Visit: Payer: Medicare Other | Admitting: Obstetrics & Gynecology

## 2018-06-24 ENCOUNTER — Ambulatory Visit: Payer: Medicare Other | Admitting: Obstetrics & Gynecology

## 2018-06-26 ENCOUNTER — Other Ambulatory Visit: Payer: Self-pay

## 2018-06-26 ENCOUNTER — Emergency Department
Admission: EM | Admit: 2018-06-26 | Discharge: 2018-06-26 | Disposition: A | Discharge status: Home / Self Care | Payer: Medicare Other | Attending: Emergency Medicine | Admitting: Emergency Medicine

## 2018-06-26 DIAGNOSIS — E876 Hypokalemia: Secondary | ICD-10-CM | POA: Diagnosis not present

## 2018-06-26 DIAGNOSIS — N390 Urinary tract infection, site not specified: Secondary | ICD-10-CM | POA: Insufficient documentation

## 2018-06-26 DIAGNOSIS — I10 Essential (primary) hypertension: Secondary | ICD-10-CM | POA: Insufficient documentation

## 2018-06-26 DIAGNOSIS — Z79899 Other long term (current) drug therapy: Secondary | ICD-10-CM | POA: Diagnosis not present

## 2018-06-26 DIAGNOSIS — Z7982 Long term (current) use of aspirin: Secondary | ICD-10-CM | POA: Insufficient documentation

## 2018-06-26 DIAGNOSIS — R531 Weakness: Secondary | ICD-10-CM

## 2018-06-26 LAB — CBC
HCT: 37.8 % (ref 36.0–46.0)
Hemoglobin: 12.9 g/dL (ref 12.0–15.0)
MCH: 29.9 pg (ref 26.0–34.0)
MCHC: 34.1 g/dL (ref 30.0–36.0)
MCV: 87.7 fL (ref 80.0–100.0)
Platelets: 291 10*3/uL (ref 150–400)
RBC: 4.31 MIL/uL (ref 3.87–5.11)
RDW: 12.7 % (ref 11.5–15.5)
WBC: 6.8 10*3/uL (ref 4.0–10.5)
nRBC: 0 % (ref 0.0–0.2)

## 2018-06-26 LAB — URINALYSIS, COMPLETE (UACMP) WITH MICROSCOPIC
Bacteria, UA: NONE SEEN
Bilirubin Urine: NEGATIVE
Glucose, UA: NEGATIVE mg/dL
Ketones, ur: NEGATIVE mg/dL
Nitrite: NEGATIVE
Protein, ur: NEGATIVE mg/dL
Specific Gravity, Urine: 1.004 — ABNORMAL LOW (ref 1.005–1.030)
pH: 7 (ref 5.0–8.0)

## 2018-06-26 LAB — TROPONIN I: Troponin I: <0.03 ng/mL (ref <0.03)

## 2018-06-26 LAB — BASIC METABOLIC PANEL
Anion gap: 12 (ref 5–15)
BUN: 13 mg/dL (ref 8–23)
CO2: 26 mmol/L (ref 22–32)
Calcium: 9.3 mg/dL (ref 8.9–10.3)
Chloride: 93 mmol/L — ABNORMAL LOW (ref 98–111)
Creatinine, Ser: 0.93 mg/dL (ref 0.44–1.00)
GFR calc Af Amer: >60 mL/min (ref >60)
GFR calc non Af Amer: 54 mL/min — ABNORMAL LOW (ref >60)
Glucose, Bld: 151 mg/dL — ABNORMAL HIGH (ref 70–99)
Potassium: 2.5 mmol/L — CL (ref 3.5–5.1)
Sodium: 131 mmol/L — ABNORMAL LOW (ref 135–145)

## 2018-06-26 LAB — GLUCOSE, CAPILLARY: Glucose-Capillary: 142 mg/dL — ABNORMAL HIGH (ref 70–99)

## 2018-06-26 MED ORDER — POTASSIUM CHLORIDE CRYS ER 20 MEQ PO TBCR
40.0000 meq | EXTENDED_RELEASE_TABLET | Freq: Once | ORAL | Status: AC
  Administered 2018-06-26: 22:00:00 40 meq via ORAL
  Filled 2018-06-26: qty 2

## 2018-06-26 MED ORDER — CEPHALEXIN 500 MG PO CAPS: 500.0000 mg | ORAL_CAPSULE | Freq: Three times a day (TID) | ORAL | 0 refills | Status: DC

## 2018-06-26 MED ORDER — POTASSIUM CHLORIDE CRYS ER 20 MEQ PO TBCR
40.0000 meq | EXTENDED_RELEASE_TABLET | Freq: Once | ORAL | Status: AC
  Administered 2018-06-26: 23:00:00 40 meq via ORAL
  Filled 2018-06-26: qty 2

## 2018-06-26 MED ORDER — SODIUM CHLORIDE 0.9 % IV BOLUS
500.0000 mL | Freq: Once | INTRAVENOUS | Status: AC
  Administered 2018-06-26: 22:00:00 500 mL via INTRAVENOUS

## 2018-06-26 MED ORDER — SODIUM CHLORIDE 0.9 % IV SOLN
1.0000 g | Freq: Once | INTRAVENOUS | Status: AC
  Administered 2018-06-26: 22:00:00 1 g via INTRAVENOUS
  Filled 2018-06-26: qty 10

## 2018-06-26 NOTE — ED Provider Notes (Signed)
[facility] Emergency Department Provider Note _________________________________________   I have reviewed the triage vital signs and the nursing notes.   HISTORY  Chief Complaint Weakness   History limited by: Not Limited   HPI Samantha Saunders is a 85 y.o. female who presents to the emergency department today because of concern for weakness. The patient states that the weakness is throughout her body. It has been present for the past week. The patient denies any associated chest pain or shortness of breath. The patient denies any fevers. She states that she has felt this weakness before when she has been anemic. Denies any problems with urination.    Records reviewed. Per medical record review patient has a history of LBBB, HTN, HLD.   Past Medical History:  Diagnosis Date  . Heart murmur   . History of kidney infection   . Hyperlipidemia   . Hypertension   . LBBB (left bundle branch block)   . Syncope and collapse     Patient Active Problem List   Diagnosis Date Noted  . Rectocele 10/19/2016  . 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 SOB (shortness of breath) 10/23/2013  . Pre-syncope 04/23/2013  . Hypertension     Past Surgical History:  Procedure Laterality Date  . ABDOMINAL HYSTERECTOMY      Prior to Admission medications   Medication Sig Start Date End Date Taking? Authorizing Provider  amLODipine (NORVASC) 10 MG tablet Take 1 tablet (10 mg total) by mouth daily. 04/21/13   Iran OuchArida, Muhammad A, MD  aspirin 81 MG tablet Take 81 mg by mouth daily.    [provider]  ezetimibe (ZETIA) 10 MG tablet Take 10 mg by mouth daily.    [provider]  hydrochlorothiazide (MICROZIDE) 12.5 MG capsule Take 12.5 mg by mouth daily.    [provider]  ipratropium (ATROVENT) 0.06 % nasal spray Place 2 sprays into the nose 3 (three) times daily. 04/01/17 04/01/18  Merrily Brittleifenbark, Neil, MD  potassium chloride (K-DUR) 10  MEQ tablet Take 4 tablets (40 mEq total) by mouth 2 (two) times daily. 09/14/17   Schaevitz, Myra Rudeavid Matthew, MD    Allergies Statins  Family History  Problem Relation Age of Onset  . Stroke Mother   . Hypertension Mother     Social History Social History   Tobacco Use  . Smoking status: Never Smoker  . Smokeless tobacco: Never Used  Substance Use Topics  . Alcohol use: No  . Drug use: No    Review of Systems Constitutional: No fever/chills. Positive for generalized weakness.  Eyes: No visual changes. ENT: No sore throat. Cardiovascular: Denies chest pain. Respiratory: Denies shortness of breath. Gastrointestinal: No abdominal pain.  No nausea, no vomiting.  No diarrhea.   Genitourinary: Negative for dysuria. Musculoskeletal: Negative for back pain. Skin: Negative for rash. Neurological: Negative for headaches, focal weakness or numbness.  ____________________________________________   PHYSICAL EXAM:  VITAL SIGNS: ED Triage Vitals  Enc Vitals Group     BP 06/26/18 1934 (!) 112/54     Pulse Rate 06/26/18 1934 (!) 108     Resp 06/26/18 1934 15     Temp 06/26/18 1934 98.2 F (36.8 C)     Temp Source 06/26/18 1934 Oral     SpO2 06/26/18 1934 98 %     Weight 06/26/18 1935 140 lb (63.5 kg)     Height 06/26/18 1935 5\' 6"  (1.676 m)     Head Circumference --  Peak Flow --      Pain Score 06/26/18 1935 5   Constitutional: Alert and oriented.  Eyes: Conjunctivae are normal.  ENT      Head: Normocephalic and atraumatic.      Nose: No congestion/rhinnorhea.      Mouth/Throat: Mucous membranes are moist.      Neck: No stridor. Hematological/Lymphatic/Immunilogical: No cervical lymphadenopathy. Cardiovascular: Normal rate, regular rhythm.  No murmurs, rubs, or gallops. Respiratory: Normal respiratory effort without tachypnea nor retractions. Breath sounds are clear and equal bilaterally. No wheezes/rales/rhonchi. Gastrointestinal: Soft and non tender. No rebound.  No guarding.  Genitourinary: Deferred Musculoskeletal: Normal range of motion in all extremities. No lower extremity edema. Neurologic:  Normal speech and language. No gross focal neurologic deficits are appreciated.  Skin:  Skin is warm, dry and intact. No rash noted. Psychiatric: Mood and affect are normal. Speech and behavior are normal. Patient exhibits appropriate insight and judgment.  ____________________________________________    LABS (pertinent positives/negatives)  BMP na 131, k 2.5, glu 151, cr 0.93 UA clear, moderate hgb dipstick, large leukocytes, 6-10 wbc and rbc CBC wbc 6.8, hgb 12.9, plt 291  ____________________________________________   EKG  I, Nance Pear, attending physician, personally viewed and interpreted this EKG  EKG Time: 1937 Rate: 106 Rhythm: sinus tachycardia Axis: left axis deviation Intervals: qtc 504 QRS: incomplete LBBB ST changes: j point elevation v2, v3 Impression: abnormal ekg  Similar morphology to EKG dated 04/02/2017  ____________________________________________    RADIOLOGY  None ____________________________________________   PROCEDURES  Procedures  ____________________________________________   INITIAL IMPRESSION / ASSESSMENT AND PLAN / ED COURSE  Pertinent labs & imaging results that were available during my care of the patient were reviewed by me and considered in my medical decision making (see chart for details).   Patient presented to the emergency department today because of concern for weakness that has been present for the past week. Work up is notable for hypokalemia and UA with suggestion of UTI. She has had similar work up findings in the past. Patient given potassium, IVFs and abx here. Patient stated she did feel better after IVFs. Discussed with patient findings and continued antibiotics. Discussed importance of PCP follow up for repeat blood work.     ____________________________________________   FINAL CLINICAL IMPRESSION(S) / ED DIAGNOSES  Final diagnoses:  Weakness  Lower urinary tract infectious disease  Hypokalemia     Note: This dictation was prepared with Dragon dictation. Any transcriptional errors that result from this process are unintentional     Nance Pear, MD 06/26/18 2322

## 2018-06-26 NOTE — ED Notes (Signed)
Patient reports frequent urination.  Patient in restroom with ED tech outside waiting for sample. Patient asked if she was done urinating, as she was taking a significant amount of time. Patient reported yes. EDT opened the restroom door and patient was sitting on the commode, with her pants/undergarments returned to the proper position, and urine sample ready. Patient asked if she needed a wheelchair. Patient stated yes. Patient had previously been able to ambulate with no issue. Patient had previously been instructed alert RN/tech when done providing sample.

## 2018-06-26 NOTE — ED Notes (Signed)
Pt states feeling weak and tired since the weekend. She lives alone and hasn't been eating or drinking much this week. Her daughter came to see her today and she brought her for evaluation

## 2018-06-26 NOTE — Discharge Instructions (Addendum)
Please seek medical attention for any high fevers, chest pain, shortness of breath, change in behavior, persistent vomiting, bloody stool or any other new or concerning symptoms.  

## 2018-06-26 NOTE — ED Triage Notes (Signed)
Patient c/o headache, weakness for several days. Patient had a nosebleed on the way to this ED. Patient denies other symptoms.

## 2018-06-28 LAB — URINE CULTURE: Culture: <10000 — AB

## 2018-06-30 ENCOUNTER — Ambulatory Visit (INDEPENDENT_AMBULATORY_CARE_PROVIDER_SITE_OTHER): Payer: Medicare Other | Admitting: Obstetrics & Gynecology

## 2018-06-30 ENCOUNTER — Encounter: Payer: Self-pay | Admitting: Obstetrics & Gynecology

## 2018-06-30 ENCOUNTER — Other Ambulatory Visit: Payer: Self-pay

## 2018-06-30 VITALS — BP 120/80 | Ht 66.0 in | Wt 132.0 lb

## 2018-06-30 DIAGNOSIS — N993 Prolapse of vaginal vault after hysterectomy: Secondary | ICD-10-CM

## 2018-06-30 DIAGNOSIS — N816 Rectocele: Secondary | ICD-10-CM | POA: Diagnosis not present

## 2018-06-30 NOTE — Progress Notes (Signed)
  HPI:      [name] is a 85 y.o. No obstetric history on file. who presents today for her pessary follow up and examination related to her pelvic floor weakening.  Pt reports tolerating the pessary well with no vaginal bleeding and  no vaginal discharge.  Symptoms of pelvic floor weakening have greatly improved. She is voiding and defecating without difficulty. She currently has a Ring #2 pessary.  PMHx: She  has a past medical history of Heart murmur, History of kidney infection, Hyperlipidemia, Hypertension, LBBB (left bundle branch block), and Syncope and collapse. Also,  has a past surgical history that includes Abdominal hysterectomy., family history includes Hypertension in her mother; Stroke in her mother.,  reports that she has never smoked. She has never used smokeless tobacco. She reports that she does not drink alcohol or use drugs.  She has a current medication list which includes the following prescription(s): amlodipine, aspirin, cephalexin, ezetimibe, hydrochlorothiazide, ipratropium, and potassium chloride. Also, is allergic to statins.  Review of Systems  All other systems reviewed and are negative.   Objective: BP 120/80   Ht 5\' 6"  (1.676 m)   Wt 132 lb (59.9 kg)   BMI 21.31 kg/m  Physical Exam Constitutional:      General: She is not in acute distress.    Appearance: She is well-developed.  Genitourinary:     Pelvic exam was performed with patient supine.     Vagina normal.     No vaginal erythema or bleeding.     Genitourinary Comments: Cuff intact/ no lesions Gr 2 vag vault prolapse Absent uterus and cervix  HENT:     Head: Normocephalic and atraumatic.     Nose: Nose normal.  Abdominal:     General: There is no distension.     Palpations: Abdomen is soft.     Tenderness: There is no abdominal tenderness.  Musculoskeletal: Normal range of motion.  Neurological:     Mental Status: She is alert and oriented to person, place, and time.     Cranial  Nerves: No cranial nerve deficit.  Skin:    General: Skin is warm and dry.     Pessary Care Pessary removed and cleaned.  Vagina checked - without erosions - pessary replaced.  A/P:  1. Prolapse of vaginal vault after hysterectomy 2. Rectocele Pessary was cleaned and replaced today. Instructions given for care. Concerning symptoms to observe for are counseled to patient. Follow up scheduled for 3 months.  A total of 15 minutes were spent face-to-face with the patient during this encounter and over half of that time dealt with counseling and coordination of care.  Barnett Applebaum, MD, [facility], [facility] 06/30/2018  3:34 PM

## 2018-07-08 ENCOUNTER — Other Ambulatory Visit: Payer: Self-pay

## 2018-07-08 ENCOUNTER — Encounter: Payer: Self-pay | Admitting: Emergency Medicine

## 2018-07-08 ENCOUNTER — Observation Stay
Admission: EM | Admit: 2018-07-08 | Discharge: 2018-07-09 | Disposition: A | Discharge status: Home / Self Care | Payer: Medicare Other | Attending: Internal Medicine | Admitting: Internal Medicine

## 2018-07-08 DIAGNOSIS — I447 Left bundle-branch block, unspecified: Secondary | ICD-10-CM | POA: Diagnosis not present

## 2018-07-08 DIAGNOSIS — Z1159 Encounter for screening for other viral diseases: Secondary | ICD-10-CM | POA: Diagnosis not present

## 2018-07-08 DIAGNOSIS — E871 Hypo-osmolality and hyponatremia: Secondary | ICD-10-CM | POA: Diagnosis present

## 2018-07-08 DIAGNOSIS — E876 Hypokalemia: Principal | ICD-10-CM | POA: Insufficient documentation

## 2018-07-08 DIAGNOSIS — T502X5A Adverse effect of carbonic-anhydrase inhibitors, benzothiadiazides and other diuretics, initial encounter: Secondary | ICD-10-CM | POA: Insufficient documentation

## 2018-07-08 DIAGNOSIS — R000 Tachycardia, unspecified: Secondary | ICD-10-CM | POA: Diagnosis not present

## 2018-07-08 DIAGNOSIS — E785 Hyperlipidemia, unspecified: Secondary | ICD-10-CM | POA: Insufficient documentation

## 2018-07-08 DIAGNOSIS — R531 Weakness: Secondary | ICD-10-CM | POA: Diagnosis present

## 2018-07-08 DIAGNOSIS — I10 Essential (primary) hypertension: Secondary | ICD-10-CM | POA: Diagnosis not present

## 2018-07-08 DIAGNOSIS — E878 Other disorders of electrolyte and fluid balance, not elsewhere classified: Secondary | ICD-10-CM | POA: Diagnosis not present

## 2018-07-08 DIAGNOSIS — Z79899 Other long term (current) drug therapy: Secondary | ICD-10-CM | POA: Insufficient documentation

## 2018-07-08 DIAGNOSIS — N390 Urinary tract infection, site not specified: Secondary | ICD-10-CM | POA: Insufficient documentation

## 2018-07-08 LAB — BASIC METABOLIC PANEL
Anion gap: 14 (ref 5–15)
BUN: 7 mg/dL — ABNORMAL LOW (ref 8–23)
CO2: 28 mmol/L (ref 22–32)
Calcium: 9 mg/dL (ref 8.9–10.3)
Chloride: 87 mmol/L — ABNORMAL LOW (ref 98–111)
Creatinine, Ser: 0.79 mg/dL (ref 0.44–1.00)
GFR calc Af Amer: >60 mL/min (ref >60)
GFR calc non Af Amer: >60 mL/min (ref >60)
Glucose, Bld: 231 mg/dL — ABNORMAL HIGH (ref 70–99)
Potassium: 2.6 mmol/L — CL (ref 3.5–5.1)
Sodium: 129 mmol/L — ABNORMAL LOW (ref 135–145)

## 2018-07-08 LAB — URINALYSIS, COMPLETE (UACMP) WITH MICROSCOPIC
Bacteria, UA: NONE SEEN
Bilirubin Urine: NEGATIVE
Glucose, UA: NEGATIVE mg/dL
Ketones, ur: NEGATIVE mg/dL
Nitrite: NEGATIVE
Protein, ur: NEGATIVE mg/dL
Specific Gravity, Urine: 1.003 — ABNORMAL LOW (ref 1.005–1.030)
pH: 7 (ref 5.0–8.0)

## 2018-07-08 LAB — COMPREHENSIVE METABOLIC PANEL
ALT: 17 U/L (ref 0–44)
AST: 37 U/L (ref 15–41)
Albumin: 4 g/dL (ref 3.5–5.0)
Alkaline Phosphatase: 46 U/L (ref 38–126)
Anion gap: 14 (ref 5–15)
BUN: 8 mg/dL (ref 8–23)
CO2: 29 mmol/L (ref 22–32)
Calcium: 8.7 mg/dL — ABNORMAL LOW (ref 8.9–10.3)
Chloride: 82 mmol/L — ABNORMAL LOW (ref 98–111)
Creatinine, Ser: 0.82 mg/dL (ref 0.44–1.00)
GFR calc Af Amer: >60 mL/min (ref >60)
GFR calc non Af Amer: >60 mL/min (ref >60)
Glucose, Bld: 121 mg/dL — ABNORMAL HIGH (ref 70–99)
Potassium: <2 mmol/L — CL (ref 3.5–5.1)
Sodium: 125 mmol/L — ABNORMAL LOW (ref 135–145)
Total Bilirubin: 1 mg/dL (ref 0.3–1.2)
Total Protein: 8.2 g/dL — ABNORMAL HIGH (ref 6.5–8.1)

## 2018-07-08 LAB — CBC WITH DIFFERENTIAL/PLATELET
Abs Immature Granulocytes: 0.01 10*3/uL (ref 0.00–0.07)
Basophils Absolute: 0 10*3/uL (ref 0.0–0.1)
Basophils Relative: 0 %
Eosinophils Absolute: 0 10*3/uL (ref 0.0–0.5)
Eosinophils Relative: 0 %
HCT: 38.8 % (ref 36.0–46.0)
Hemoglobin: 13.8 g/dL (ref 12.0–15.0)
Immature Granulocytes: 0 %
Lymphocytes Relative: 34 %
Lymphs Abs: 2.4 10*3/uL (ref 0.7–4.0)
MCH: 29.6 pg (ref 26.0–34.0)
MCHC: 35.6 g/dL (ref 30.0–36.0)
MCV: 83.3 fL (ref 80.0–100.0)
Monocytes Absolute: 0.8 10*3/uL (ref 0.1–1.0)
Monocytes Relative: 12 %
Neutro Abs: 3.7 10*3/uL (ref 1.7–7.7)
Neutrophils Relative %: 54 %
Platelets: 297 10*3/uL (ref 150–400)
RBC: 4.66 MIL/uL (ref 3.87–5.11)
RDW: 12 % (ref 11.5–15.5)
WBC: 7 10*3/uL (ref 4.0–10.5)
nRBC: 0 % (ref 0.0–0.2)

## 2018-07-08 LAB — TROPONIN I (HIGH SENSITIVITY): Troponin I (High Sensitivity): 7 ng/L (ref <18)

## 2018-07-08 LAB — MAGNESIUM: Magnesium: 1.8 mg/dL (ref 1.7–2.4)

## 2018-07-08 LAB — TSH: TSH: 1.145 u[IU]/mL (ref 0.350–4.500)

## 2018-07-08 MED ORDER — POTASSIUM CHLORIDE 20 MEQ PO PACK
40.0000 meq | PACK | Freq: Two times a day (BID) | ORAL | Status: DC
  Administered 2018-07-08 – 2018-07-09 (×2): 40 meq via ORAL
  Filled 2018-07-08 (×2): qty 2

## 2018-07-08 MED ORDER — ONDANSETRON HCL 4 MG PO TABS: 4.0000 mg | ORAL_TABLET | Freq: Four times a day (QID) | ORAL | Status: DC | PRN

## 2018-07-08 MED ORDER — ONDANSETRON HCL 4 MG/2ML IJ SOLN: 4.0000 mg | Freq: Four times a day (QID) | INTRAMUSCULAR | Status: DC | PRN

## 2018-07-08 MED ORDER — POLYETHYLENE GLYCOL 3350 17 G PO PACK: 17.0000 g | PACK | Freq: Every day | ORAL | Status: DC | PRN

## 2018-07-08 MED ORDER — ACETAMINOPHEN 650 MG RE SUPP: 650.0000 mg | Freq: Four times a day (QID) | RECTAL | Status: DC | PRN

## 2018-07-08 MED ORDER — KCL IN DEXTROSE-NACL 40-5-0.45 MEQ/L-%-% IV SOLN
INTRAVENOUS | Status: DC
  Administered 2018-07-08 (×2): via INTRAVENOUS
  Filled 2018-07-08 (×6): qty 1000

## 2018-07-08 MED ORDER — POTASSIUM CHLORIDE CRYS ER 20 MEQ PO TBCR
40.0000 meq | EXTENDED_RELEASE_TABLET | Freq: Once | ORAL | Status: AC
  Administered 2018-07-08: 18:00:00 40 meq via ORAL
  Filled 2018-07-08: qty 2

## 2018-07-08 MED ORDER — SODIUM CHLORIDE 0.9% FLUSH: 3.0000 mL | Freq: Two times a day (BID) | INTRAVENOUS | Status: DC

## 2018-07-08 MED ORDER — ASPIRIN EC 81 MG PO TBEC
81.0000 mg | DELAYED_RELEASE_TABLET | Freq: Every day | ORAL | Status: DC
  Administered 2018-07-09: 81 mg via ORAL
  Filled 2018-07-08: qty 1

## 2018-07-08 MED ORDER — AMLODIPINE BESYLATE 10 MG PO TABS
10.0000 mg | ORAL_TABLET | Freq: Every day | ORAL | Status: DC
  Administered 2018-07-09: 10 mg via ORAL
  Filled 2018-07-08: qty 1

## 2018-07-08 MED ORDER — ACETAMINOPHEN 325 MG PO TABS: 650.0000 mg | ORAL_TABLET | Freq: Four times a day (QID) | ORAL | Status: DC | PRN

## 2018-07-08 MED ORDER — EZETIMIBE 10 MG PO TABS
10.0000 mg | ORAL_TABLET | Freq: Every day | ORAL | Status: DC
  Administered 2018-07-09: 10 mg via ORAL
  Filled 2018-07-08 (×2): qty 1

## 2018-07-08 MED ORDER — ENOXAPARIN SODIUM 40 MG/0.4ML ~~LOC~~ SOLN
40.0000 mg | SUBCUTANEOUS | Status: DC
  Administered 2018-07-08: 40 mg via SUBCUTANEOUS
  Filled 2018-07-08: qty 0.4

## 2018-07-08 NOTE — Plan of Care (Signed)

## 2018-07-08 NOTE — ED Notes (Signed)
Date and time results received: 07/08/18 1807   Test: Potassium Critical Value: <2  Name of Provider Notified: Jimmye Norman  Orders Received? Or Actions Taken?: Acknowledged

## 2018-07-08 NOTE — ED Notes (Signed)
Hospitalist to bedside at this time 

## 2018-07-08 NOTE — ED Triage Notes (Signed)
Pt in via [facility] from home, reports generalized weakness and loss of appetite x a few days.  Pt A/Ox3, NAD noted at this time.

## 2018-07-08 NOTE — ED Notes (Signed)
Pharmacy notified to send D5 W/ KCl.

## 2018-07-08 NOTE — H&P (Signed)
[place] [facility] [place] NAME: Samantha Saunders    MR#:  [id number]  DATE OF BIRTH:  06-May-1926  DATE OF ADMISSION:  07/08/2018  PRIMARY CARE PHYSICIAN: Gennette Pac, FNP   REQUESTING/REFERRING PHYSICIAN: Lenise Arena, MD  CHIEF COMPLAINT:   Chief Complaint  Patient presents with  . Weakness    HISTORY OF PRESENT ILLNESS:  Samantha Saunders  is a 85 y.o. female with a known history of hyperlipidemia, hypertension, history of syncope with collapse.  She presented to the emergency room complaining of generalized weakness.  Patient reports "feeling bad "in the morning when she woke around 8 AM.  After eating breakfast around 10:00 patient says she felt no improvement and in fact had increasing weakness with some associated nausea.  She has noticed no unilateral weakness.  No slurred speech or facial drooping.  She has no prior history of stroke.  She denies chest pain, shortness of breath, palpitations.  She denies fevers, chills, abdominal pain.  She has experienced some nausea prior to arrival however no vomiting.  Labs on arrival demonstrate hypokalemia with potassium less than 2.0 as well as hyponatremia with sodium 125.  Troponin is negative.  She is treated with HCTZ 12.5 mg once a day.  She reports no changes in medication regimen recently.  She denies recent illness.  Denies dysuria, urine frequency or hematuria.  We have admitted her to the hospitalist service for further management  PAST MEDICAL HISTORY:   Past Medical History:  Diagnosis Date  . Heart murmur   . History of kidney infection   . Hyperlipidemia   . Hypertension   . LBBB (left bundle branch block)   . Syncope and collapse     PAST SURGICAL HISTORY:   Past Surgical History:  Procedure Laterality Date  . ABDOMINAL HYSTERECTOMY      SOCIAL HISTORY:   Social History   Tobacco Use  . Smoking status: Never Smoker  . Smokeless tobacco: Never Used  Substance Use  Topics  . Alcohol use: No    FAMILY HISTORY:   Family History  Problem Relation Age of Onset  . Stroke Mother   . Hypertension Mother     DRUG ALLERGIES:   Allergies  Allergen Reactions  . Statins Hives    REVIEW OF SYSTEMS:   Review of Systems  Constitutional: Positive for malaise/fatigue. Negative for chills and fever.  HENT: Negative for congestion and sore throat.   Eyes: Positive for double vision. Negative for blurred vision.  Respiratory: Negative for cough, shortness of breath and wheezing.   Cardiovascular: Negative for chest pain, palpitations and leg swelling.  Gastrointestinal: Positive for nausea. Negative for abdominal pain, blood in stool, constipation, melena and vomiting.  Genitourinary: Positive for flank pain. Negative for dysuria, hematuria and urgency.  Musculoskeletal: Negative for falls and myalgias.  Skin: Negative.  Negative for itching and rash.  Neurological: Negative for dizziness, loss of consciousness, weakness and headaches.  Psychiatric/Behavioral: Negative.  Negative for depression.   MEDICATIONS AT HOME:   Prior to Admission medications   Medication Sig Start Date End Date Taking? Authorizing Provider  amLODipine (NORVASC) 10 MG tablet Take 1 tablet (10 mg total) by mouth daily. 04/21/13   Wellington Hampshire, MD  aspirin 81 MG tablet Take 81 mg by mouth daily.    [provider]  cephALEXin (KEFLEX) 500 MG capsule Take 1 capsule (500 mg total) by mouth 3 (three) times daily. 06/26/18   Archie Balboa,  Dustin FlockGraydon, MD  ezetimibe (ZETIA) 10 MG tablet Take 10 mg by mouth daily.    [provider]  hydrochlorothiazide (MICROZIDE) 12.5 MG capsule Take 12.5 mg by mouth daily.    [provider]  ipratropium (ATROVENT) 0.06 % nasal spray Place 2 sprays into the nose 3 (three) times daily. 04/01/17 04/01/18  Merrily Brittleifenbark, Neil, MD  potassium chloride (K-DUR) 10 MEQ tablet Take 4 tablets (40 mEq total) by mouth 2 (two) times daily. 09/14/17    Schaevitz, Myra Rudeavid Matthew, MD      VITAL SIGNS:  Blood pressure 131/66, pulse (!) 44, temperature 98.6 F (37 C), temperature source Oral, resp. rate 18, height 5\' 7"  (1.702 m), weight 68 kg, SpO2 98 %.  PHYSICAL EXAMINATION:  Physical Exam  GENERAL:  85 y.o.-year-old patient lying in the bed with no acute distress.  EYES: Pupils equal, round, reactive to light and accommodation. No scleral icterus. Extraocular muscles intact.  HEENT: Head atraumatic, normocephalic. Oropharynx and nasopharynx clear.  NECK:  Supple, no jugular venous distention. No thyroid enlargement, no tenderness.  LUNGS: Normal breath sounds bilaterally, no wheezing, rales,rhonchi. No use of accessory muscles of respiration.  CARDIOVASCULAR: Regular rate and rhythm, S1, S2 normal. No murmurs, rubs, or gallops.  ABDOMEN: Soft, nondistended, nontender. Bowel sounds present. No organomegaly or mass.  EXTREMITIES: No pedal edema, cyanosis, or clubbing.  NEUROLOGIC: Cranial nerves II through XII are intact. Muscle strength 5/5 in all extremities. Sensation intact. Gait not checked.  PSYCHIATRIC: The patient is alert and oriented x 3.  Normal affect and good eye contact. SKIN: No obvious rash, lesion, or ulcer.   LABORATORY PANEL:   CBC Recent Labs  Lab 07/08/18 1720  WBC 7.0  HGB 13.8  HCT 38.8  PLT 297   ------------------------------------------------------------------------------------------------------------------  Chemistries  Recent Labs  Lab 07/08/18 1720  NA 125*  K <2.0*  CL 82*  CO2 29  GLUCOSE 121*  BUN 8  CREATININE 0.82  CALCIUM 8.7*  MG 1.8  AST 37  ALT 17  ALKPHOS 46  BILITOT 1.0   ------------------------------------------------------------------------------------------------------------------  Cardiac Enzymes No results for input(s): TROPONINI in the last 168 hours. ------------------------------------------------------------------------------------------------------------------   RADIOLOGY:  No results found.    IMPRESSION AND PLAN:   1.  Severe hypokalemia - Patient received IV potassium replacement with continuous infusion - Telemetry monitoring - BMP every 4 hours -We will hold hydrochlorothiazide  2. hyponatremia - Patient is receiving IV fluid replacement -We will repeat BMP every 4 hours and monitor sodium levels closely - Will hold hydrochlorothiazide  3.  Generalized weakness - We will consult physical therapy for supportive care  4.  UTI -We will treat patient for possible urinary tract infection given moderate leukocytes seen on urinalysis -We will get urine culture.  5. hypertension - Norvasc restarted- we are holding hydrochlorothiazide.  DVT and PPI prophylaxis initiated   All the records are reviewed and case discussed with ED provider. The plan of care was discussed in details with the patient (and family). I answered all questions. The patient agreed to proceed with the above mentioned plan. Further management will depend upon hospital course.   CODE STATUS: Full code  TOTAL TIME TAKING CARE OF THIS PATIENT: 45 minutes.    Milas Kocherngela H Tyrrell Stephens CRNP on 07/08/2018 at 7:26 PM  Pager - [phone]  After 6pm go to [email or web address] - Social research officer, governmentpassword EPAS [facility]  [facility] [facility]  Office  [phone]  CC: Primary care physician; Toy CookeyHeadrick, Emily, FNP   Note: This dictation  was prepared with Dragon dictation along with smaller phrase technology. Any transcriptional errors that result from this process are unintentional.

## 2018-07-08 NOTE — ED Provider Notes (Signed)
[facility] Emergency Department Provider Note       Time seen: ----------------------------------------- 5:19 PM on 07/08/2018 -----------------------------------------   I have reviewed the triage vital signs and the nursing notes.  HISTORY   Chief Complaint No chief complaint on file.    HPI Samantha Saunders is a 85 y.o. female with a history of hyperlipidemia, hypertension, syncope and collapse who presents to the ED for generalized weakness.  Patient states she got up and ate breakfast this morning.  She was not feeling better initially.  She subsequently had profound weakness with some nausea.  Nausea seems to have resolved, now she has diffuse weakness.  She denies fevers, chills or other complaints.  Past Medical History:  Diagnosis Date  . Heart murmur   . History of kidney infection   . Hyperlipidemia   . Hypertension   . LBBB (left bundle branch block)   . Syncope and collapse     Patient Active Problem List   Diagnosis Date Noted  . Rectocele 10/19/2016  . 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 SOB (shortness of breath) 10/23/2013  . Pre-syncope 04/23/2013  . Hypertension     Past Surgical History:  Procedure Laterality Date  . ABDOMINAL HYSTERECTOMY      Allergies Statins  Social History Social History   Tobacco Use  . Smoking status: Never Smoker  . Smokeless tobacco: Never Used  Substance Use Topics  . Alcohol use: No  . Drug use: No   Review of Systems Constitutional: Negative for fever. Cardiovascular: Negative for chest pain. Respiratory: Negative for shortness of breath. Gastrointestinal: Negative for abdominal pain, vomiting and diarrhea. Musculoskeletal: Negative for back pain. Skin: Negative for rash. Neurological: Positive for generalized weakness  All systems negative/normal/unremarkable except as stated in the HPI  ____________________________________________   PHYSICAL  EXAM:  VITAL SIGNS: ED Triage Vitals  Enc Vitals Group     BP      Pulse      Resp      Temp      Temp src      SpO2      Weight      Height      Head Circumference      Peak Flow      Pain Score      Pain Loc      Pain Edu?      Excl. in GC?    Constitutional: Alert and oriented. Well appearing and in no distress. Eyes: Conjunctivae are normal. Normal extraocular movements. Cardiovascular: Normal rate, regular rhythm. No murmurs, rubs, or gallops. Respiratory: Normal respiratory effort without tachypnea nor retractions. Breath sounds are clear and equal bilaterally. No wheezes/rales/rhonchi. Gastrointestinal: Soft and nontender. Normal bowel sounds Musculoskeletal: Nontender with normal range of motion in extremities. No lower extremity tenderness nor edema. Neurologic:  Normal speech and language. No gross focal neurologic deficits are appreciated.  Skin:  Skin is warm, dry and intact. No rash noted. Psychiatric: Mood and affect are normal. Speech and behavior are normal.  ____________________________________________  EKG: Interpreted by me.  Sinus tachycardia with a rate of 111 bpm, bigeminy, left bundle branch block, normal axis  ____________________________________________  ED COURSE:  As part of my medical decision making, I reviewed the following data within the electronic MEDICAL RECORD NUMBER History obtained from family if available, nursing notes, old chart and ekg, as well as notes from prior ED visits. Patient presented for weakness, we will assess with labs and  imaging as indicated at this time.   Procedures  Samantha Saunders was evaluated in Emergency Department on 07/08/2018 for the symptoms described in the history of present illness. She was evaluated in the context of the global COVID-19 pandemic, which necessitated consideration that the patient might be at risk for infection with the SARS-CoV-2 virus that causes COVID-19. Institutional protocols and algorithms  that pertain to the evaluation of patients at risk for COVID-19 are in a state of rapid change based on information released by regulatory bodies including the CDC and federal and state organizations. These policies and algorithms were followed during the patient's care in the ED.  ____________________________________________   LABS (pertinent positives/negatives)  Labs Reviewed  COMPREHENSIVE METABOLIC PANEL - Abnormal; Notable for the following components:      Result Value   Sodium 125 (*)    Potassium <2.0 (*)    Chloride 82 (*)    Glucose, Bld 121 (*)    Calcium 8.7 (*)    Total Protein 8.2 (*)    All other components within normal limits  NOVEL CORONAVIRUS, NAA (HOSPITAL ORDER, SEND-OUT TO REF LAB)  CBC WITH DIFFERENTIAL/PLATELET  TROPONIN I (HIGH SENSITIVITY)  URINALYSIS, COMPLETE (UACMP) WITH MICROSCOPIC  MAGNESIUM  CBG MONITORING, ED  ____________________________________________   CRITICAL CARE Performed by: Laurence Aly   Total critical care time: 30 minutes  Critical care time was exclusive of separately billable procedures and treating other patients.  Critical care was necessary to treat or prevent imminent or life-threatening deterioration.  Critical care was time spent personally by me on the following activities: development of treatment plan with patient and/or surrogate as well as nursing, discussions with consultants, evaluation of patient's response to treatment, examination of patient, obtaining history from patient or surrogate, ordering and performing treatments and interventions, ordering and review of laboratory studies, ordering and review of radiographic studies, pulse oximetry and re-evaluation of patient's condition.   DIFFERENTIAL DIAGNOSIS   Dehydration, electrolyte abnormality, occult infection, arrhythmia, MI  FINAL ASSESSMENT AND PLAN  Weakness, severe hypokalemia, hyponatremia   Plan: The patient had presented for generalized  weakness. Patient's labs revealed severe hypokalemia, also hyponatremia and hypochloremia.  She was given IV fluids as well as oral potassium.  We started a continuous infusion with potassium.  She has not had any coronavirus symptoms.  I will discuss with the hospitalist for admission.   Laurence Aly, MD    Note: This note was generated in part or whole with voice recognition software. Voice recognition is usually quite accurate but there are transcription errors that can and very often do occur. I apologize for any typographical errors that were not detected and corrected.     Earleen Newport, MD 07/08/18 [id number]

## 2018-07-08 NOTE — ED Notes (Signed)
Lab notified to add on Magnesium. 

## 2018-07-08 NOTE — ED Notes (Signed)
Pt's daughter and Chauncey Reading, Graylon Gunning updated on admission status and plan of care per this RN.

## 2018-07-08 NOTE — ED Notes (Signed)
Pt ambulatory to toilet with standby assist. 

## 2018-07-08 NOTE — ED Notes (Signed)
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 Yes   Triage Complete: Triage complete  Chief Complaint Weakness  Triage Note Pt in via [facility] from home, reports generalized weakness and loss of appetite x a few days.  Pt A/Ox3, NAD noted at this time.   Allergies Allergies  Allergen Reactions  . Statins Hives    Level of Care/Admitting Diagnosis ED Disposition    ED Disposition Condition Comment   Admit  [facility]ospital [facility] [[id number]]  Level of Care: Telemetry [5]  Covid Evaluation: Screening Protocol (No Symptoms)  Diagnosis: Hypokalemia [756433][172180]  Admitting Physician: Pearletha AlfredSEALS, ANGELA H [[id number]]  Attending Physician: Pearletha AlfredSEALS, ANGELA H [[id number]]  Estimated length of stay: past midnight tomorrow  Certification:: I certify this patient will need inpatient services for at least 2 midnights  PT Class (Do Not Modify): Inpatient [101]  PT Acc Code (Do Not Modify): Private [1]       B Medical/Surgery History Past Medical History:  Diagnosis Date  . Heart murmur   . History of kidney infection   . Hyperlipidemia   . Hypertension   . LBBB (left bundle branch block)   . Syncope and collapse    Past Surgical History:  Procedure Laterality Date  . ABDOMINAL HYSTERECTOMY       A IV Location/Drains/Wounds Patient Lines/Drains/Airways Status   Active Line/Drains/Airways    Name:   Placement date:   Placement time:   Site:   Days:   Peripheral IV 07/08/18 Left Antecubital   07/08/18    1700    Antecubital   less than 1          Intake/Output Last 24 hours No intake or output data in the 24 hours ending 07/08/18 2011  Labs/Imaging Results for orders placed or performed during the hospital encounter of 07/08/18 (from  the past 48 hour(Saunders))  CBC with Differential     Status: None   Collection Time: 07/08/18  5:20 PM  Result Value Ref Range   WBC 7.0 4.0 - 10.5 K/uL   RBC 4.66 3.87 - 5.11 MIL/uL   Hemoglobin 13.8 12.0 - 15.0 g/dL   HCT 16.038.8 10.936.0 - 32.346.0 %   MCV 83.3 80.0 - 100.0 fL   MCH 29.6 26.0 - 34.0 pg   MCHC 35.6 30.0 - 36.0 g/dL   RDW 55.712.0 32.211.5 - 02.515.5 %   Platelets 297 150 - 400 K/uL   nRBC 0.0 0.0 - 0.2 %   Neutrophils Relative % 54 %   Neutro Abs 3.7 1.7 - 7.7 K/uL   Lymphocytes Relative 34 %   Lymphs Abs 2.4 0.7 - 4.0 K/uL   Monocytes Relative 12 %   Monocytes Absolute 0.8 0.1 - 1.0 K/uL   Eosinophils Relative 0 %   Eosinophils Absolute 0.0 0.0 - 0.5 K/uL   Basophils Relative 0 %   Basophils Absolute 0.0 0.0 - 0.1 K/uL   Immature Granulocytes 0 %   Abs Immature Granulocytes 0.01 0.00 - 0.07 K/uL    Comment: Performed at [facility], [address] [place], [place] [address]  Comprehensive metabolic panel     Status: Abnormal   Collection Time: 07/08/18  5:20 PM  Result Value Ref Range   Sodium 125 (L) 135 -  145 mmol/L   Potassium <2.0 (LL) 3.5 - 5.1 mmol/L    Comment: CRITICAL RESULT CALLED TO, READ BACK BY AND VERIFIED WITH Beckett Maden AT 1815 07/08/2018.  TFK RESULTS VERIFIED BY REPEAT TESTING    Chloride 82 (L) 98 - 111 mmol/L   CO2 29 22 - 32 mmol/L   Glucose, Bld 121 (H) 70 - 99 mg/dL   BUN 8 8 - 23 mg/dL   Creatinine, Ser 0.82 0.44 - 1.00 mg/dL   Calcium 8.7 (L) 8.9 - 10.3 mg/dL   Total Protein 8.2 (H) 6.5 - 8.1 g/dL   Albumin 4.0 3.5 - 5.0 g/dL   AST 37 15 - 41 U/L   ALT 17 0 - 44 U/L   Alkaline Phosphatase 46 38 - 126 U/L   Total Bilirubin 1.0 0.3 - 1.2 mg/dL   GFR calc non Af Amer >60 >60 mL/min   GFR calc Af Amer >60 >60 mL/min   Anion gap 14 5 - 15    Comment: Performed at [facility], [place], [place] [address]  Troponin I (High Sensitivity)     Status: None   Collection Time: 07/08/18  5:20 PM  Result Value Ref Range    Troponin I (High Sensitivity) 7 <18 ng/L    Comment: (NOTE) Elevated high sensitivity troponin I (hsTnI) values and significant  changes across serial measurements may suggest ACS but many other  chronic and acute conditions are known to elevate hsTnI results.  Refer to the "Links" section for chest pain algorithms and additional  guidance. Performed at [facility], [address] [place], [place] [address]   Magnesium     Status: None   Collection Time: 07/08/18  5:20 PM  Result Value Ref Range   Magnesium 1.8 1.7 - 2.4 mg/dL    Comment: Performed at [facility], [address] [place], [place] [address]  Urinalysis, Complete w Microscopic     Status: Abnormal   Collection Time: 07/08/18  7:22 PM  Result Value Ref Range   Color, Urine STRAW (A) YELLOW   APPearance CLEAR (A) CLEAR   Specific Gravity, Urine 1.003 (L) 1.005 - 1.030   pH 7.0 5.0 - 8.0   Glucose, UA NEGATIVE NEGATIVE mg/dL   Hgb urine dipstick MODERATE (A) NEGATIVE   Bilirubin Urine NEGATIVE NEGATIVE   Ketones, ur NEGATIVE NEGATIVE mg/dL   Protein, ur NEGATIVE NEGATIVE mg/dL   Nitrite NEGATIVE NEGATIVE   Leukocytes,Ua MODERATE (A) NEGATIVE   RBC / HPF 0-5 0 - 5 RBC/hpf   WBC, UA 6-10 0 - 5 WBC/hpf   Bacteria, UA NONE SEEN NONE SEEN   Squamous Epithelial / LPF 0-5 0 - 5   Mucus PRESENT     Comment: Performed at [facility], [address] [place], [place] [address]   No results found.  Pending Labs Unresulted Labs (From admission, onward)    Start     Ordered   07/15/18 0500  Creatinine, serum  (enoxaparin (LOVENOX)    CrCl >/= 30 ml/min)  Weekly,   STAT    Comments: while on enoxaparin therapy    07/08/18 1926   07/09/18 3976  Basic metabolic panel  Tomorrow morning,   STAT     07/08/18 1926   07/09/18 0500  CBC  Tomorrow morning,   STAT     07/08/18 1926   07/08/18 7341  Basic metabolic panel  Now then every 4 hours,   STAT  07/08/18 1926   07/08/18 1927  CBC   (enoxaparin (LOVENOX)    CrCl >/= 30 ml/min)  Once,   STAT    Comments: Baseline for enoxaparin therapy IF NOT ALREADY DRAWN.  Notify MD if PLT < 100 K.    07/08/18 1926   07/08/18 1927  Creatinine, serum  (enoxaparin (LOVENOX)    CrCl >/= 30 ml/min)  Once,   STAT    Comments: Baseline for enoxaparin therapy IF NOT ALREADY DRAWN.    07/08/18 1926   07/08/18 1927  TSH  Once,   STAT     07/08/18 1926   07/08/18 1837  Novel Coronavirus,NAA,(SEND-OUT TO REF LAB - TAT 24-48 hrs); Hosp Order  (Asymptomatic Patients Labs)  Once,   STAT    Question:  Rule Out  Answer:  Yes   07/08/18 1836          Vitals/Pain Today'Saunders Vitals   07/08/18 1723 07/08/18 1800 07/08/18 1900 07/08/18 2000  BP:  (!) 136/57 131/66 127/61  Pulse:   88 79  Resp:  17 18 (!) 32  Temp:      TempSrc:      SpO2:  98% 98% 100%  Weight: 68 kg     Height: 5\' 7"  (1.702 m)     PainSc: 0-No pain       Isolation Precautions No active isolations  Medications Medications  dextrose 5 % and 0.45 % NaCl with KCl 40 mEq/L infusion ( Intravenous New Bag/Given 07/08/18 1846)  amLODipine (NORVASC) tablet 10 mg (has no administration in time range)  potassium chloride (KLOR-CON) packet 40 mEq (has no administration in time range)  aspirin EC tablet 81 mg (has no administration in time range)  ezetimibe (ZETIA) tablet 10 mg (has no administration in time range)  enoxaparin (LOVENOX) injection 40 mg (has no administration in time range)  sodium chloride flush (NS) 0.9 % injection 3 mL (has no administration in time range)  acetaminophen (TYLENOL) tablet 650 mg (has no administration in time range)    Or  acetaminophen (TYLENOL) suppository 650 mg (has no administration in time range)  polyethylene glycol (MIRALAX / GLYCOLAX) packet 17 g (has no administration in time range)  ondansetron (ZOFRAN) tablet 4 mg (has no administration in time range)    Or  ondansetron (ZOFRAN) injection 4 mg (has no administration in time range)   potassium chloride SA (K-DUR) CR tablet 40 mEq (40 mEq Oral Given 07/08/18 1825)    Mobility walks Low fall risk   Focused Assessments N/A   R Recommendations: See Admitting Provider Note  Report given to:   Additional Notes: N/A

## 2018-07-08 NOTE — ED Notes (Signed)
This RN provided update via telephone to patients daughter and Chauncey Reading, Graylon Gunning; verbal permission given per patient to disclose information.

## 2018-07-09 LAB — BASIC METABOLIC PANEL
Anion gap: 6 (ref 5–15)
Anion gap: 5 (ref 5–15)
Anion gap: 9 (ref 5–15)
BUN: 6 mg/dL — ABNORMAL LOW (ref 8–23)
BUN: <5 mg/dL — ABNORMAL LOW (ref 8–23)
BUN: <5 mg/dL — ABNORMAL LOW (ref 8–23)
CO2: 29 mmol/L (ref 22–32)
CO2: 28 mmol/L (ref 22–32)
CO2: 29 mmol/L (ref 22–32)
Calcium: 8.9 mg/dL (ref 8.9–10.3)
Calcium: 8.7 mg/dL — ABNORMAL LOW (ref 8.9–10.3)
Calcium: 9 mg/dL (ref 8.9–10.3)
Chloride: 95 mmol/L — ABNORMAL LOW (ref 98–111)
Chloride: 99 mmol/L (ref 98–111)
Chloride: 99 mmol/L (ref 98–111)
Creatinine, Ser: 0.73 mg/dL (ref 0.44–1.00)
Creatinine, Ser: 0.67 mg/dL (ref 0.44–1.00)
Creatinine, Ser: 0.62 mg/dL (ref 0.44–1.00)
GFR calc Af Amer: >60 mL/min (ref >60)
GFR calc Af Amer: >60 mL/min (ref >60)
GFR calc Af Amer: >60 mL/min (ref >60)
GFR calc non Af Amer: >60 mL/min (ref >60)
GFR calc non Af Amer: >60 mL/min (ref >60)
GFR calc non Af Amer: >60 mL/min (ref >60)
Glucose, Bld: 129 mg/dL — ABNORMAL HIGH (ref 70–99)
Glucose, Bld: 148 mg/dL — ABNORMAL HIGH (ref 70–99)
Glucose, Bld: 101 mg/dL — ABNORMAL HIGH (ref 70–99)
Potassium: 3.6 mmol/L (ref 3.5–5.1)
Potassium: 3.3 mmol/L — ABNORMAL LOW (ref 3.5–5.1)
Potassium: 3.6 mmol/L (ref 3.5–5.1)
Sodium: 130 mmol/L — ABNORMAL LOW (ref 135–145)
Sodium: 132 mmol/L — ABNORMAL LOW (ref 135–145)
Sodium: 137 mmol/L (ref 135–145)

## 2018-07-09 LAB — CBC
HCT: 37.5 % (ref 36.0–46.0)
Hemoglobin: 12.9 g/dL (ref 12.0–15.0)
MCH: 29.8 pg (ref 26.0–34.0)
MCHC: 34.4 g/dL (ref 30.0–36.0)
MCV: 86.6 fL (ref 80.0–100.0)
Platelets: 279 10*3/uL (ref 150–400)
RBC: 4.33 MIL/uL (ref 3.87–5.11)
RDW: 12.2 % (ref 11.5–15.5)
WBC: 3.6 10*3/uL — ABNORMAL LOW (ref 4.0–10.5)
nRBC: 0 % (ref 0.0–0.2)

## 2018-07-09 MED ORDER — KCL IN DEXTROSE-NACL 40-5-0.45 MEQ/L-%-% IV SOLN
INTRAVENOUS | Status: DC
  Administered 2018-07-09 (×2): via INTRAVENOUS
  Filled 2018-07-09 (×2): qty 1000

## 2018-07-09 MED ORDER — SODIUM CHLORIDE 0.9 % IV SOLN
1.0000 g | INTRAVENOUS | Status: DC
  Administered 2018-07-09: 01:00:00 1 g via INTRAVENOUS
  Filled 2018-07-09: qty 10
  Filled 2018-07-09: qty 1

## 2018-07-09 MED ORDER — POTASSIUM CHLORIDE 20 MEQ PO PACK: 40.0000 meq | PACK | Freq: Two times a day (BID) | ORAL | 0 refills | Status: DC

## 2018-07-09 NOTE — Care Management CC44 (Signed)
Condition Code 44 Documentation Completed  Patient Details  Name: Samantha Saunders MRN: [id number] Date of Birth: 20-Nov-1926   Condition Code 44 given:  Yes Patient signature on Condition Code 44 notice:  Yes Documentation of 2 MD's agreement:  Yes Code 44 added to claim:  Yes    Elza Rafter, RN 07/09/2018, 11:43 AM

## 2018-07-09 NOTE — Discharge Summary (Signed)
[name]    MR#:  [id number]  DATE OF BIRTH:  August 13, 1926  DATE OF ADMISSION:  07/08/2018 ADMITTING PHYSICIAN: Gwynneth Aliment, MD  DATE OF DISCHARGE: 07/09/2018  PRIMARY CARE PHYSICIAN: Gennette Pac, FNP    ADMISSION DIAGNOSIS:  Hypokalemia [E87.6] Hyponatremia [E87.1] Weakness [R53.1]  DISCHARGE DIAGNOSIS:  Hyponatremia/Hy[pokalemia due to diuretic--improved  SECONDARY DIAGNOSIS:   Past Medical History:  Diagnosis Date  . Heart murmur   . History of kidney infection   . Hyperlipidemia   . Hypertension   . LBBB (left bundle branch block)   . Syncope and collapse     HOSPITAL COURSE:  Samantha Saunders  is a 85 y.o. female with a known history of hyperlipidemia, hypertension, history of syncope with collapse.  She presented to the emergency room complaining of generalized weakness. Labs on arrival demonstrate hypokalemia with potassium less than 2.0 as well as hyponatremia with sodium 125  1.  Severe hypokalemia due to HCTZ - Patient received IV potassium replacement with continuous infusion -came in with K 2.0--now 3.5 -will give oral K tabs at discharge for few days -d/ced hydrochlorothiazide  2. hyponatremia - Patient is receiving IV fluid replacement -NA 137 3.  Generalized weakness - We will consult physical therapy for supportive care  4.  UTI -pt received one dose of IV rocephin. She just finished a course of Keflex as outpt -no s/y of UTI -d/c abxs  5. hypertension - Norvasc restarted -BP OK  DVT and PPI prophylaxis initiated  Patient feels a lot better and back to baseline. Her baseline is walk without aid and she continues to drive.  CONSULTS OBTAINED:    DRUG ALLERGIES:   Allergies  Allergen Reactions  . Statins Hives    DISCHARGE MEDICATIONS:   Allergies as of 07/09/2018      Reactions   Statins Hives      Medication List    STOP taking these  medications   cephALEXin 500 MG capsule Commonly known as: KEFLEX   hydrochlorothiazide 12.5 MG capsule Commonly known as: MICROZIDE   potassium chloride 10 MEQ tablet Commonly known as: K-DUR Replaced by: potassium chloride 20 MEQ packet     TAKE these medications   amLODipine 10 MG tablet Commonly known as: NORVASC Take 1 tablet (10 mg total) by mouth daily.   aspirin 81 MG tablet Take 81 mg by mouth daily.   ezetimibe 10 MG tablet Commonly known as: ZETIA Take 10 mg by mouth daily.   potassium chloride 20 MEQ packet Commonly known as: KLOR-CON Take 40 mEq by mouth 2 (two) times daily for 3 days. Replaces: potassium chloride 10 MEQ tablet       If you experience worsening of your admission symptoms, develop shortness of breath, life threatening emergency, suicidal or homicidal thoughts you must seek medical attention immediately by calling 911 or calling your MD immediately  if symptoms less severe.  You Must read complete instructions/literature along with all the possible adverse reactions/side effects for all the Medicines you take and that have been prescribed to you. Take any new Medicines after you have completely understood and accept all the possible adverse reactions/side effects.   Please note  You were cared for by a hospitalist during your hospital stay. If you have any questions about your discharge medications or the care you received while you were in the hospital after you are discharged, you can call the unit and  asked to speak with the hospitalist on call if the hospitalist that took care of you is not available. Once you are discharged, your primary care physician will handle any further medical issues. Please note that NO REFILLS for any discharge medications will be authorized once you are discharged, as it is imperative that you return to your primary care physician (or establish a relationship with a primary care physician if you do not have one) for  your aftercare needs so that they can reassess your need for medications and monitor your lab values. Today   SUBJECTIVE   Doing well. I feel fine  VITAL SIGNS:  Blood pressure (!) 110/92, pulse 74, temperature 97.6 F (36.4 C), temperature source Oral, resp. rate 20, height 5\' 4"  (1.626 m), weight 57.2 kg, SpO2 100 %.  I/O:    Intake/Output Summary (Last 24 hours) at 07/09/2018 1047 Last data filed at 07/09/2018 1008 Gross per 24 hour  Intake 2405.01 ml  Output 1100 ml  Net 1305.01 ml    PHYSICAL EXAMINATION:  GENERAL:  85 y.o.-year-old patient lying in the bed with no acute distress.  EYES: Pupils equal, round, reactive to light and accommodation. No scleral icterus. Extraocular muscles intact.  HEENT: Head atraumatic, normocephalic. Oropharynx and nasopharynx clear.  NECK:  Supple, no jugular venous distention. No thyroid enlargement, no tenderness.  LUNGS: Normal breath sounds bilaterally, no wheezing, rales,rhonchi or crepitation. No use of accessory muscles of respiration.  CARDIOVASCULAR: S1, S2 normal. No murmurs, rubs, or gallops.  ABDOMEN: Soft, non-tender, non-distended. Bowel sounds present. No organomegaly or mass.  EXTREMITIES: No pedal edema, cyanosis, or clubbing.  NEUROLOGIC: Cranial nerves II through XII are intact. Muscle strength 5/5 in all extremities. Sensation intact. Gait not checked.  PSYCHIATRIC: The patient is alert and oriented x 3.  SKIN: No obvious rash, lesion, or ulcer.   DATA REVIEW:   CBC  Recent Labs  Lab 07/09/18 0539  WBC 3.6*  HGB 12.9  HCT 37.5  PLT 279    Chemistries  Recent Labs  Lab 07/08/18 1720  07/09/18 0854  NA 125*   < > 137  K <2.0*   < > 3.6  CL 82*   < > 99  CO2 29   < > 29  GLUCOSE 121*   < > 101*  BUN 8   < > <5*  CREATININE 0.82   < > 0.62  CALCIUM 8.7*   < > 9.0  MG 1.8  --   --   AST 37  --   --   ALT 17  --   --   ALKPHOS 46  --   --   BILITOT 1.0  --   --    < > = values in this interval not  displayed.    Microbiology Results   No results found for this or any previous visit (from the past 240 hour(s)).  RADIOLOGY:  No results found.   CODE STATUS:     Code Status Orders  (From admission, onward)         Start     Ordered   07/08/18 1927  Full code  Continuous     07/08/18 1926        Code Status History    This patient has a current code status but no historical code status.   Advance Care Planning Activity    Advance Directive Documentation     Most Recent Value  Type of Advance Directive  Healthcare Power  of Attorney, Living will  Pre-existing out of facility DNR order (yellow form or pink MOST form)  -  "MOST" Form in Place?  -      TOTAL TIME TAKING CARE OF THIS PATIENT: *40 minutes.    Enedina FinnerSona Iliyah Bui M.D on 07/09/2018 at 10:47 AM  Between 7am to 6pm - Pager - [phone] After 6pm go to [email or web address] - Social research officer, governmentpassword [email or web address] [email or web address]  [facility]  Office  [phone]  CC: Primary care physician; Toy CookeyHeadrick, Emily, FNP

## 2018-07-09 NOTE — Progress Notes (Signed)
Patient's was admitted with c/o generalized weakness and a diagnosis of hypokalemia. ON admission patient was A&O X4, denied being in pain , endorsed being weak but better, and was able to ambulate to her room independently. Patient has a D51/2NS40K infusing @250mL  per hour and it decreased to 18ml/hr per order. Patient was able provide admission questions, patient was oriented to her and care plan was discussed. Patient has no acute event overnight, remained in NSR with stable VS. Will continue to monitor .

## 2018-07-09 NOTE — Care Management Obs Status (Signed)
[facility] NOTIFICATION   Patient Details  Name: Samantha Saunders MRN: [id number] Date of Birth: [birth year]   Medicare Observation Status Notification Given:  Yes    Elza Rafter, RN 07/09/2018, 11:43 AM

## 2018-07-09 NOTE — Progress Notes (Signed)
Discharge instructions explained to pt / verbalized an understanding/ iv and tele removed/ will transport off unit via wheelchair/ pts daughter updated by Dr. Patel/ pts states she is missing her black shoes / ER called - no shoes found/ informed pt that if shoes found - staff would call her

## 2018-07-09 NOTE — Progress Notes (Signed)
Gardiner Barefoot NP was notified of improvement of patient K from 2.6 to 3.6. She said she wants patient D51/2NS+40K infusion rate to change from 180mL/hr to 98ml/hr after the currently hanging bag finishes.

## 2018-07-10 LAB — NOVEL CORONAVIRUS, NAA (HOSP ORDER, SEND-OUT TO REF LAB; TAT 18-24 HRS): SARS-CoV-2, NAA: NOT DETECTED

## 2018-07-10 LAB — URINE CULTURE: Culture: <10000 — AB

## 2018-09-30 ENCOUNTER — Ambulatory Visit: Payer: Medicare Other | Admitting: Obstetrics & Gynecology

## 2018-10-01 ENCOUNTER — Other Ambulatory Visit: Payer: Self-pay

## 2018-10-01 ENCOUNTER — Ambulatory Visit (INDEPENDENT_AMBULATORY_CARE_PROVIDER_SITE_OTHER): Payer: Medicare Other | Admitting: Obstetrics & Gynecology

## 2018-10-01 ENCOUNTER — Encounter: Payer: Self-pay | Admitting: Obstetrics & Gynecology

## 2018-10-01 VITALS — BP 110/60 | Ht 64.0 in | Wt 126.0 lb

## 2018-10-01 DIAGNOSIS — N993 Prolapse of vaginal vault after hysterectomy: Secondary | ICD-10-CM

## 2018-10-01 DIAGNOSIS — N816 Rectocele: Secondary | ICD-10-CM | POA: Diagnosis not present

## 2018-10-01 NOTE — Progress Notes (Signed)
  HPI:      [name] is a 85 y.o. No obstetric history on file. who presents today for her pessary follow up and examination related to her pelvic floor weakening.  Pt reports tolerating the pessary well with  no vaginal bleeding and  no vaginal discharge.  Symptoms of pelvic floor weakening have greatly improved. She is voiding and defecating without difficulty. She currently has a Ring #2 pessary.  PMHx: She  has a past medical history of Heart murmur, History of kidney infection, Hyperlipidemia, Hypertension, LBBB (left bundle branch block), and Syncope and collapse. Also,  has a past surgical history that includes Abdominal hysterectomy., family history includes Hypertension in her mother; Stroke in her mother.,  reports that she has never smoked. She has never used smokeless tobacco. She reports that she does not drink alcohol or use drugs.  She has a current medication list which includes the following prescription(s): amlodipine, aspirin, ezetimibe, and potassium chloride. Also, is allergic to statins.  Review of Systems  All other systems reviewed and are negative.   Objective: BP 110/60   Ht 5\' 4"  (1.626 m)   Wt 126 lb (57.2 kg)   BMI 21.63 kg/m  Physical Exam Constitutional:      General: She is not in acute distress.    Appearance: She is well-developed.  Genitourinary:     Pelvic exam was performed with patient supine.     Vagina normal.     No vaginal erythema or bleeding.     Genitourinary Comments: Cuff intact/ no lesions Vaginal vault prolapse, rectocele Absent uterus and cervix  HENT:     Head: Normocephalic and atraumatic.     Nose: Nose normal.  Abdominal:     General: There is no distension.     Palpations: Abdomen is soft.     Tenderness: There is no abdominal tenderness.  Musculoskeletal: Normal range of motion.  Neurological:     Mental Status: She is alert and oriented to person, place, and time.     Cranial Nerves: No cranial nerve deficit.   Skin:    General: Skin is warm and dry.  Psychiatric:        Attention and Perception: Attention normal.        Mood and Affect: Mood normal.        Speech: Speech normal.        Behavior: Behavior normal.        Cognition and Memory: Cognition normal.        Judgment: Judgment normal.     Pessary Care Pessary removed and cleaned.  Vagina checked - without erosions - pessary replaced.  A/P:   ICD-10-CM   1. 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 Rectocele  N81.6    Pessary was cleaned and replaced today. Instructions given for care. Concerning symptoms to observe for are counseled to patient. Follow up scheduled for 3 months.  A total of 15 minutes were spent face-to-face with the patient during this encounter and over half of that time dealt with counseling and coordination of care.  Barnett Applebaum, MD, [facility], [facility] 10/01/2018  3:41 PM

## 2018-12-31 ENCOUNTER — Ambulatory Visit: Payer: Medicare Other | Admitting: Obstetrics & Gynecology

## 2019-01-15 ENCOUNTER — Other Ambulatory Visit: Payer: Self-pay

## 2019-01-15 ENCOUNTER — Encounter: Payer: Self-pay | Admitting: Obstetrics & Gynecology

## 2019-01-15 ENCOUNTER — Ambulatory Visit (INDEPENDENT_AMBULATORY_CARE_PROVIDER_SITE_OTHER): Payer: Medicare Other | Admitting: Obstetrics & Gynecology

## 2019-01-15 VITALS — BP 110/60 | Ht 64.0 in | Wt 132.0 lb

## 2019-01-15 DIAGNOSIS — N993 Prolapse of vaginal vault after hysterectomy: Secondary | ICD-10-CM

## 2019-01-15 DIAGNOSIS — N816 Rectocele: Secondary | ICD-10-CM | POA: Diagnosis not present

## 2019-01-15 NOTE — Progress Notes (Signed)
  HPI:      [name] is a 85 y.o. No obstetric history on file. who presents today for her pessary follow up and examination related to her pelvic floor weakening.  Pt reports tolerating the pessary well with  no vaginal bleeding and  no vaginal discharge.  Symptoms of pelvic floor weakening have greatly improved. She is voiding and defecating without difficulty. She currently has a Ring #2 pessary.  PMHx: She  has a past medical history of Heart murmur, History of kidney infection, Hyperlipidemia, Hypertension, LBBB (left bundle branch block), and Syncope and collapse. Also,  has a past surgical history that includes Abdominal hysterectomy., family history includes Hypertension in her mother; Stroke in her mother.,  reports that she has never smoked. She has never used smokeless tobacco. She reports that she does not drink alcohol or use drugs.  She has a current medication list which includes the following prescription(s): amlodipine, aspirin, ezetimibe, and potassium chloride. Also, is allergic to statins.  Review of Systems  All other systems reviewed and are negative.   Objective: BP 110/60   Ht 5\' 4"  (1.626 m)   Wt 132 lb (59.9 kg)   BMI 22.66 kg/m  Physical Exam Constitutional:      General: She is not in acute distress.    Appearance: She is well-developed.  Genitourinary:     Pelvic exam was performed with patient supine.     Vagina normal.     No vaginal erythema or bleeding.     Genitourinary Comments: Cuff intact/ no lesions Vaginal floor weakening noted Absent uterus and cervix  HENT:     Head: Normocephalic and atraumatic.     Nose: Nose normal.  Abdominal:     General: There is no distension.     Palpations: Abdomen is soft.     Tenderness: There is no abdominal tenderness.  Musculoskeletal:        General: Normal range of motion.  Neurological:     Mental Status: She is alert and oriented to person, place, and time.     Cranial Nerves: No cranial nerve  deficit.  Skin:    General: Skin is warm and dry.  Psychiatric:        Attention and Perception: Attention normal.        Mood and Affect: Mood normal.        Speech: Speech normal.        Behavior: Behavior normal.        Cognition and Memory: Cognition normal.        Judgment: Judgment normal.     Pessary Care Pessary removed and cleaned.  Vagina checked - without erosions - pessary replaced.  A/P:   ICD-10-CM   1. 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 Rectocele  N81.6    Pessary was cleaned and replaced today. Instructions given for care. Concerning symptoms to observe for are counseled to patient. Follow up scheduled for 3 months.  A total of 15 minutes were spent face-to-face with the patient during this encounter and over half of that time dealt with counseling and coordination of care.  Barnett Applebaum, MD, [facility], [facility] 01/15/2019  1:18 PM

## 2019-03-17 ENCOUNTER — Encounter: Payer: Self-pay | Admitting: Obstetrics & Gynecology

## 2019-03-17 ENCOUNTER — Other Ambulatory Visit: Payer: Self-pay

## 2019-03-17 ENCOUNTER — Ambulatory Visit (INDEPENDENT_AMBULATORY_CARE_PROVIDER_SITE_OTHER): Payer: Medicare Other | Admitting: Obstetrics & Gynecology

## 2019-03-17 VITALS — BP 146/82 | HR 73 | Wt 132.0 lb

## 2019-03-17 DIAGNOSIS — N816 Rectocele: Secondary | ICD-10-CM

## 2019-03-17 DIAGNOSIS — N993 Prolapse of vaginal vault after hysterectomy: Secondary | ICD-10-CM

## 2019-03-17 DIAGNOSIS — Z90710 Acquired absence of both cervix and uterus: Secondary | ICD-10-CM | POA: Diagnosis not present

## 2019-03-17 NOTE — Progress Notes (Signed)
  HPI:      [name] is a 86 y.o. No obstetric history on file. who presents today for her pessary follow up and examination related to her pelvic floor weakening.  Pt reports tolerating the pessary well with no vaginal bleeding and no vaginal discharge.  Symptoms of pelvic floor weakening have greatly improved. She is voiding and defecating without difficulty. She currently has a RING TYPE #2 pessary.  Pt notes she feels well and is receiving the second covid10 vaccine tomorrow.  PMHx: She  has a past medical history of Heart murmur, History of kidney infection, Hyperlipidemia, Hypertension, LBBB (left bundle branch block), and Syncope and collapse. Also,  has a past surgical history that includes Abdominal hysterectomy., family history includes Hypertension in her mother; Stroke in her mother.,  reports that she has never smoked. She has never used smokeless tobacco. She reports that she does not drink alcohol or use drugs.  She has a current medication list which includes the following prescription(s): amlodipine, aspirin, ezetimibe, and potassium chloride. Also, is allergic to statins.  Review of Systems  All other systems reviewed and are negative.   Objective: BP (!) 146/82   Pulse 73   Wt 132 lb (59.9 kg)   BMI 22.66 kg/m  Physical Exam Constitutional:      General: She is not in acute distress.    Appearance: She is well-developed.  Genitourinary:     Pelvic exam was performed with patient supine.     Vagina normal.     No vaginal erythema or bleeding.     Genitourinary Comments: Cuff intact/ no lesions Rectocele to introitus without pessary Pelvic floor weakening Absent uterus and cervix  HENT:     Head: Normocephalic and atraumatic.     Nose: Nose normal.  Abdominal:     General: There is no distension.     Palpations: Abdomen is soft.     Tenderness: There is no abdominal tenderness.  Musculoskeletal:        General: Normal range of motion.  Neurological:       Mental Status: She is alert and oriented to person, place, and time.     Cranial Nerves: No cranial nerve deficit.  Skin:    General: Skin is warm and dry.  Psychiatric:        Attention and Perception: Attention normal.        Mood and Affect: Mood normal.        Speech: Speech normal.        Behavior: Behavior normal.        Cognition and Memory: Cognition normal.        Judgment: Judgment normal.    Pessary Care Pessary removed and cleaned.  Vagina checked - without erosions - pessary replaced.  A/P:   ICD-10-CM   1. 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 Rectocele  N81.6    Pessary was cleaned and replaced today. Instructions given for care. Concerning symptoms to observe for are counseled to patient. Follow up scheduled for 3 months.  A total of 20 minutes were spent face-to-face with the patient as well as preparation, review, communication, and documentation during this encounter.   Annamarie Major, MD, [name], [facility] 03/17/2019  2:28 PM

## 2019-06-17 ENCOUNTER — Ambulatory Visit (INDEPENDENT_AMBULATORY_CARE_PROVIDER_SITE_OTHER): Payer: Medicare Other | Admitting: Obstetrics & Gynecology

## 2019-06-17 ENCOUNTER — Other Ambulatory Visit: Payer: Self-pay

## 2019-06-17 ENCOUNTER — Encounter: Payer: Self-pay | Admitting: Obstetrics & Gynecology

## 2019-06-17 VITALS — BP 120/80 | Ht 65.0 in | Wt 131.0 lb

## 2019-06-17 DIAGNOSIS — N993 Prolapse of vaginal vault after hysterectomy: Secondary | ICD-10-CM | POA: Diagnosis not present

## 2019-06-17 DIAGNOSIS — N816 Rectocele: Secondary | ICD-10-CM | POA: Diagnosis not present

## 2019-06-17 NOTE — Patient Instructions (Addendum)
Happy Birthday! Wishing you another year of health and prosperity! Dr Tiburcio Pea

## 2019-06-17 NOTE — Progress Notes (Signed)
  HPI:      [name] is a 86 y.o. [phone] who presents today for her pessary follow up and examination related to her pelvic floor weakening.  Pt reports tolerating the pessary well with  no vaginal bleeding and  no vaginal discharge.  Symptoms of pelvic floor weakening have greatly improved. She is voiding and defecating without difficulty. She currently has a RING #2 pessary.  PMHx: She  has a past medical history of Heart murmur, History of kidney infection, Hyperlipidemia, Hypertension, LBBB (left bundle branch block), and Syncope and collapse. Also,  has a past surgical history that includes Abdominal hysterectomy., family history includes Hypertension in her mother; Stroke in her mother.,  reports that she has never smoked. She has never used smokeless tobacco. She reports that she does not drink alcohol or use drugs.  She has a current medication list which includes the following prescription(s): amlodipine, aspirin, ezetimibe, and potassium chloride. Also, is allergic to statins.  Review of Systems  All other systems reviewed and are negative.   Objective: BP 120/80   Ht 5\' 5"  (1.651 m)   Wt 131 lb (59.4 kg)   BMI 21.80 kg/m  Physical Exam Constitutional:      General: She is not in acute distress.    Appearance: She is well-developed.  Genitourinary:     Pelvic exam was performed with patient supine.     Vagina normal.     No vaginal erythema or bleeding.     Genitourinary Comments: Cuff intact/ no lesions Rectocele to introitus without pessary Pelvic floor weakening Absent uterus and cervix  HENT:     Head: Normocephalic and atraumatic.     Nose: Nose normal.  Abdominal:     General: There is no distension.     Palpations: Abdomen is soft.     Tenderness: There is no abdominal tenderness.  Musculoskeletal:        General: Normal range of motion.  Neurological:     Mental Status: She is alert and oriented to person, place, and time.     Cranial Nerves: No  cranial nerve deficit.  Skin:    General: Skin is warm and dry.  Psychiatric:        Attention and Perception: Attention normal.        Mood and Affect: Mood normal.        Speech: Speech normal.        Behavior: Behavior normal.        Cognition and Memory: Cognition normal.        Judgment: Judgment normal.     Pessary Care Pessary removed and cleaned.  Vagina checked - without erosions - pessary replaced.  A/P:   ICD-10-CM   1. 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 Rectocele  N81.6    Pessary was cleaned and replaced today. Instructions given for care. Concerning symptoms to observe for are counseled to patient. Follow up scheduled for 3 months.  A total of 20 minutes were spent face-to-face with the patient as well as preparation, review, communication, and documentation during this encounter.   , MD, Annamarie Major Ob/Gyn, [facility] 06/17/2019  2:52 PM

## 2019-08-12 ENCOUNTER — Other Ambulatory Visit: Payer: Self-pay

## 2019-08-12 ENCOUNTER — Encounter: Payer: Self-pay | Admitting: Obstetrics & Gynecology

## 2019-08-12 ENCOUNTER — Ambulatory Visit (INDEPENDENT_AMBULATORY_CARE_PROVIDER_SITE_OTHER): Payer: Medicare Other | Admitting: Obstetrics & Gynecology

## 2019-08-12 VITALS — BP 140/80 | Ht 64.0 in | Wt 123.0 lb

## 2019-08-12 DIAGNOSIS — N993 Prolapse of vaginal vault after hysterectomy: Secondary | ICD-10-CM | POA: Diagnosis not present

## 2019-08-12 DIAGNOSIS — N816 Rectocele: Secondary | ICD-10-CM

## 2019-08-12 NOTE — Progress Notes (Signed)
  HPI:      [name] is a 86 y.o. [phone] who presents today for her pessary follow up and examination related to her pelvic floor weakening.  Pt reports vaginal bleeding.  Bleeding was Saturday and Sunday, like a period one day.  No pain.  Symptoms of pelvic floor weakening have greatly improved with the pessary.  She currently has a Ring#2 pessary.  PMHx: She  has a past medical history of Heart murmur, History of kidney infection, Hyperlipidemia, Hypertension, LBBB (left bundle branch block), and Syncope and collapse. Also,  has a past surgical history that includes Abdominal hysterectomy., family history includes Hypertension in her mother; Stroke in her mother.,  reports that she has never smoked. She has never used smokeless tobacco. She reports that she does not drink alcohol and does not use drugs.  She has a current medication list which includes the following prescription(s): amlodipine, aspirin, ezetimibe, and potassium chloride. Also, is allergic to statins.  Review of Systems  All other systems reviewed and are negative.   Objective: BP (!) 140/80   Ht 5\' 4"  (1.626 m)   Wt 123 lb (55.8 kg)   BMI 21.11 kg/m  Physical Exam Constitutional:      General: She is not in acute distress.    Appearance: She is well-developed.  Genitourinary:     Pelvic exam was performed with patient supine.     Vagina normal.     No vaginal erythema or bleeding.     Genitourinary Comments: Cuff intact/ no lesions No ulcerations, laceration.  Pink bloody d/c on qtip. Absent uterus and cervix  HENT:     Head: Normocephalic and atraumatic.     Nose: Nose normal.  Abdominal:     General: There is no distension.     Palpations: Abdomen is soft.     Tenderness: There is no abdominal tenderness.  Musculoskeletal:        General: Normal range of motion.  Neurological:     Mental Status: She is alert and oriented to person, place, and time.     Cranial Nerves: No cranial nerve deficit.    Skin:    General: Skin is warm and dry.  Psychiatric:        Attention and Perception: Attention normal.        Mood and Affect: Mood normal.        Speech: Speech normal.        Behavior: Behavior normal.        Cognition and Memory: Cognition normal.        Judgment: Judgment normal.     A/P:Vaginal Bleeding   ICD-10-CM   1. 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 Rectocele  N81.6   Pessary holiday advised.  Will leave out and follow symptoms closely.  Replace in 4 weeks.  A total of 20 minutes were spent face-to-face with the patient as well as preparation, review, communication, and documentation during this encounter.   , MD, Annamarie Major Ob/Gyn, [facility] 08/12/2019  4:17 PM

## 2019-09-10 ENCOUNTER — Ambulatory Visit: Payer: Medicare Other | Admitting: Obstetrics & Gynecology

## 2019-09-11 ENCOUNTER — Encounter: Payer: Self-pay | Admitting: Obstetrics & Gynecology

## 2019-09-11 ENCOUNTER — Other Ambulatory Visit: Payer: Self-pay

## 2019-09-11 ENCOUNTER — Ambulatory Visit (INDEPENDENT_AMBULATORY_CARE_PROVIDER_SITE_OTHER): Payer: Medicare Other | Admitting: Obstetrics & Gynecology

## 2019-09-11 VITALS — BP 140/80 | Ht 64.0 in | Wt 124.0 lb

## 2019-09-11 DIAGNOSIS — N816 Rectocele: Secondary | ICD-10-CM

## 2019-09-11 DIAGNOSIS — N993 Prolapse of vaginal vault after hysterectomy: Secondary | ICD-10-CM | POA: Diagnosis not present

## 2019-09-11 NOTE — Progress Notes (Signed)
  HPI:      [name] is a 86 y.o. [phone] who presents today for her pessary follow up and examination related to her pelvic floor weakening.  Pt reports tolerating the pessary well with recent vaginal bleeding and thus pessary holiday of her ring #2 pessary for the last 4 weeks.  No further bleeding.  Feels more pressure in vagina.  No BM concerns.  PMHx: She  has a past medical history of Heart murmur, History of kidney infection, Hyperlipidemia, Hypertension, LBBB (left bundle branch block), and Syncope and collapse. Also,  has a past surgical history that includes Abdominal hysterectomy., family history includes Hypertension in her mother; Stroke in her mother.,  reports that she has never smoked. She has never used smokeless tobacco. She reports that she does not drink alcohol and does not use drugs.  She has a current medication list which includes the following prescription(s): amlodipine, aspirin, ezetimibe, and potassium chloride. Also, is allergic to statins.  Review of Systems  All other systems reviewed and are negative.   Objective: BP 140/80   Ht 5\' 4"  (1.626 m)   Wt 124 lb (56.2 kg)   BMI 21.28 kg/m  Physical Exam Constitutional:      General: She is not in acute distress.    Appearance: She is well-developed.  Genitourinary:     Pelvic exam was performed with patient supine.     Vagina normal.     No vaginal erythema or bleeding.     Genitourinary Comments: Cuff intact/ no lesions Prolapsing vag tissues.  No bleeding. Absent uterus and cervix  HENT:     Head: Normocephalic and atraumatic.     Nose: Nose normal.  Abdominal:     General: There is no distension.     Palpations: Abdomen is soft.     Tenderness: There is no abdominal tenderness.  Musculoskeletal:        General: Normal range of motion.  Neurological:     Mental Status: She is alert and oriented to person, place, and time.     Cranial Nerves: No cranial nerve deficit.  Skin:    General:  Skin is warm and dry.  Psychiatric:        Attention and Perception: Attention normal.        Mood and Affect: Mood normal.        Speech: Speech normal.        Behavior: Behavior normal.        Cognition and Memory: Cognition normal.        Judgment: Judgment normal.     Pessary Care Vagina checked - without erosions - pessary replaced.  A/P:   ICD-10-CM   1. 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 Rectocele  N81.6    Pessary was cleaned and placed today. Instructions given for care. Concerning symptoms to observe for are counseled to patient. Follow up scheduled for 3 months. Consider change to ring size 1 if bleeding recurs.  A total of 20 minutes were spent face-to-face with the patient as well as preparation, review, communication, and documentation during this encounter.   , MD, Annamarie Major Ob/Gyn, [facility] 09/11/2019  1:29 PM

## 2019-09-14 ENCOUNTER — Ambulatory Visit (INDEPENDENT_AMBULATORY_CARE_PROVIDER_SITE_OTHER): Payer: Medicare Other | Admitting: Obstetrics & Gynecology

## 2019-09-14 ENCOUNTER — Encounter: Payer: Self-pay | Admitting: Obstetrics & Gynecology

## 2019-09-14 ENCOUNTER — Other Ambulatory Visit: Payer: Self-pay

## 2019-09-14 VITALS — BP 140/80 | Ht 64.0 in | Wt 127.0 lb

## 2019-09-14 DIAGNOSIS — T839XXS Unspecified complication of genitourinary prosthetic device, implant and graft, sequela: Secondary | ICD-10-CM | POA: Diagnosis not present

## 2019-09-14 NOTE — Progress Notes (Signed)
  HPI:      [name] is a 86 y.o. [phone] who presents today for her pessary follow up as the ring #2 fell out over the weekend. She has had this size and type of pessary without expulsion for many years.    PMHx: She  has a past medical history of Heart murmur, History of kidney infection, Hyperlipidemia, Hypertension, LBBB (left bundle branch block), and Syncope and collapse. Also,  has a past surgical history that includes Abdominal hysterectomy., family history includes Hypertension in her mother; Stroke in her mother.,  reports that she has never smoked. She has never used smokeless tobacco. She reports that she does not drink alcohol and does not use drugs.  She has a current medication list which includes the following prescription(s): amlodipine, aspirin, ezetimibe, and potassium chloride. Also, is allergic to statins.  Review of Systems  All other systems reviewed and are negative.   Objective: BP 140/80   Ht 5\' 4"  (1.626 m)   Wt 127 lb (57.6 kg)   BMI 21.80 kg/m  Physical Exam Constitutional:      General: She is not in acute distress.    Appearance: She is well-developed.  Genitourinary:     Pelvic exam was performed with patient supine.     Vagina normal.     No vaginal erythema or bleeding.     Genitourinary Comments: Cuff intact/ no lesions  Absent uterus and cervix  HENT:     Head: Normocephalic and atraumatic.     Nose: Nose normal.  Abdominal:     General: There is no distension.     Palpations: Abdomen is soft.     Tenderness: There is no abdominal tenderness.  Musculoskeletal:        General: Normal range of motion.  Neurological:     Mental Status: She is alert and oriented to person, place, and time.     Cranial Nerves: No cranial nerve deficit.  Skin:    General: Skin is warm and dry.  Psychiatric:        Attention and Perception: Attention normal.        Mood and Affect: Mood normal.        Speech: Speech normal.        Behavior: Behavior  normal.        Cognition and Memory: Cognition normal.        Judgment: Judgment normal.     Pessary Care Pessary removed and cleaned.  Vagina checked - without erosions - pessary replaced.  A/P: Pessary Expulsion Pessary was cleaned and replaced today. Instructions given for care. If repeat expulsion, change size to #1, #1 [place], or other considerations Concerning symptoms to observe for are counseled to patient. Follow up scheduled for 3 months.  A total of 20 minutes were spent face-to-face with the patient as well as preparation, review, communication, and documentation during this encounter.   , MD, Annamarie Major Ob/Gyn, [facility] 09/14/2019  3:26 PM

## 2019-09-15 ENCOUNTER — Ambulatory Visit: Payer: Medicare Other | Admitting: Obstetrics & Gynecology

## 2019-09-16 ENCOUNTER — Ambulatory Visit: Payer: Medicare Other | Admitting: Obstetrics & Gynecology

## 2019-12-04 ENCOUNTER — Other Ambulatory Visit: Payer: Self-pay

## 2019-12-04 ENCOUNTER — Emergency Department: Payer: Medicare Other

## 2019-12-04 ENCOUNTER — Encounter: Payer: Self-pay | Admitting: Emergency Medicine

## 2019-12-04 ENCOUNTER — Inpatient Hospital Stay: Admit: 2019-12-04 | Payer: Medicare Other

## 2019-12-04 ENCOUNTER — Inpatient Hospital Stay
Admission: EM | Admit: 2019-12-04 | Discharge: 2019-12-06 | DRG: 871 | Disposition: A | Discharge status: Home / Self Care | Payer: Medicare Other | Attending: Internal Medicine | Admitting: Internal Medicine

## 2019-12-04 DIAGNOSIS — Z888 Allergy status to other drugs, medicaments and biological substances status: Secondary | ICD-10-CM | POA: Diagnosis not present

## 2019-12-04 DIAGNOSIS — N10 Acute tubulo-interstitial nephritis: Secondary | ICD-10-CM | POA: Diagnosis present

## 2019-12-04 DIAGNOSIS — E876 Hypokalemia: Secondary | ICD-10-CM | POA: Diagnosis present

## 2019-12-04 DIAGNOSIS — J9601 Acute respiratory failure with hypoxia: Secondary | ICD-10-CM | POA: Diagnosis present

## 2019-12-04 DIAGNOSIS — A419 Sepsis, unspecified organism: Secondary | ICD-10-CM | POA: Diagnosis present

## 2019-12-04 DIAGNOSIS — Z87440 Personal history of urinary (tract) infections: Secondary | ICD-10-CM

## 2019-12-04 DIAGNOSIS — E785 Hyperlipidemia, unspecified: Secondary | ICD-10-CM | POA: Diagnosis present

## 2019-12-04 DIAGNOSIS — Z79899 Other long term (current) drug therapy: Secondary | ICD-10-CM

## 2019-12-04 DIAGNOSIS — Z823 Family history of stroke: Secondary | ICD-10-CM

## 2019-12-04 DIAGNOSIS — Z20822 Contact with and (suspected) exposure to covid-19: Secondary | ICD-10-CM | POA: Diagnosis present

## 2019-12-04 DIAGNOSIS — Z66 Do not resuscitate: Secondary | ICD-10-CM | POA: Diagnosis present

## 2019-12-04 DIAGNOSIS — I10 Essential (primary) hypertension: Secondary | ICD-10-CM | POA: Diagnosis not present

## 2019-12-04 DIAGNOSIS — Z8249 Family history of ischemic heart disease and other diseases of the circulatory system: Secondary | ICD-10-CM | POA: Diagnosis not present

## 2019-12-04 DIAGNOSIS — Z7982 Long term (current) use of aspirin: Secondary | ICD-10-CM

## 2019-12-04 DIAGNOSIS — R6520 Severe sepsis without septic shock: Secondary | ICD-10-CM

## 2019-12-04 DIAGNOSIS — N3000 Acute cystitis without hematuria: Secondary | ICD-10-CM

## 2019-12-04 DIAGNOSIS — Z90710 Acquired absence of both cervix and uterus: Secondary | ICD-10-CM | POA: Diagnosis not present

## 2019-12-04 DIAGNOSIS — N390 Urinary tract infection, site not specified: Secondary | ICD-10-CM | POA: Diagnosis present

## 2019-12-04 LAB — BRAIN NATRIURETIC PEPTIDE: B Natriuretic Peptide: 53.8 pg/mL (ref 0.0–100.0)

## 2019-12-04 LAB — COMPREHENSIVE METABOLIC PANEL
ALT: 15 U/L (ref 0–44)
AST: 30 U/L (ref 15–41)
Albumin: 3.8 g/dL (ref 3.5–5.0)
Alkaline Phosphatase: 43 U/L (ref 38–126)
Anion gap: 13 (ref 5–15)
BUN: 11 mg/dL (ref 8–23)
CO2: 25 mmol/L (ref 22–32)
Calcium: 9 mg/dL (ref 8.9–10.3)
Chloride: 97 mmol/L — ABNORMAL LOW (ref 98–111)
Creatinine, Ser: 1.08 mg/dL — ABNORMAL HIGH (ref 0.44–1.00)
GFR, Estimated: 48 mL/min — ABNORMAL LOW (ref >60)
Glucose, Bld: 194 mg/dL — ABNORMAL HIGH (ref 70–99)
Potassium: 3.3 mmol/L — ABNORMAL LOW (ref 3.5–5.1)
Sodium: 135 mmol/L (ref 135–145)
Total Bilirubin: 0.7 mg/dL (ref 0.3–1.2)
Total Protein: 7.6 g/dL (ref 6.5–8.1)

## 2019-12-04 LAB — CBC WITH DIFFERENTIAL/PLATELET
Abs Immature Granulocytes: 0 10*3/uL (ref 0.00–0.07)
Basophils Absolute: 0 10*3/uL (ref 0.0–0.1)
Basophils Relative: 0 %
Eosinophils Absolute: 0 10*3/uL (ref 0.0–0.5)
Eosinophils Relative: 0 %
HCT: 38 % (ref 36.0–46.0)
Hemoglobin: 12.5 g/dL (ref 12.0–15.0)
Immature Granulocytes: 0 %
Lymphocytes Relative: 25 %
Lymphs Abs: 1.3 10*3/uL (ref 0.7–4.0)
MCH: 29.1 pg (ref 26.0–34.0)
MCHC: 32.9 g/dL (ref 30.0–36.0)
MCV: 88.6 fL (ref 80.0–100.0)
Monocytes Absolute: 0.4 10*3/uL (ref 0.1–1.0)
Monocytes Relative: 7 %
Neutro Abs: 3.6 10*3/uL (ref 1.7–7.7)
Neutrophils Relative %: 68 %
Platelets: 263 10*3/uL (ref 150–400)
RBC: 4.29 MIL/uL (ref 3.87–5.11)
RDW: 12.8 % (ref 11.5–15.5)
WBC: 5.3 10*3/uL (ref 4.0–10.5)
nRBC: 0 % (ref 0.0–0.2)

## 2019-12-04 LAB — URINALYSIS, COMPLETE (UACMP) WITH MICROSCOPIC
Bilirubin Urine: NEGATIVE
Glucose, UA: NEGATIVE mg/dL
Ketones, ur: NEGATIVE mg/dL
Nitrite: NEGATIVE
Protein, ur: NEGATIVE mg/dL
Specific Gravity, Urine: 1.005 (ref 1.005–1.030)
WBC, UA: >50 WBC/hpf — ABNORMAL HIGH (ref 0–5)
pH: 6 (ref 5.0–8.0)

## 2019-12-04 LAB — RESP PANEL BY RT-PCR (FLU A&B, COVID) ARPGX2
Influenza A by PCR: NEGATIVE
Influenza B by PCR: NEGATIVE
SARS Coronavirus 2 by RT PCR: NEGATIVE

## 2019-12-04 LAB — LACTIC ACID, PLASMA
Lactic Acid, Venous: 2.8 mmol/L (ref 0.5–1.9)
Lactic Acid, Venous: 1.8 mmol/L (ref 0.5–1.9)

## 2019-12-04 LAB — TROPONIN I (HIGH SENSITIVITY)
Troponin I (High Sensitivity): 3 ng/L (ref <18)
Troponin I (High Sensitivity): 4 ng/L (ref <18)

## 2019-12-04 LAB — MAGNESIUM: Magnesium: 2.1 mg/dL (ref 1.7–2.4)

## 2019-12-04 LAB — PROTIME-INR
INR: 1 (ref 0.8–1.2)
Prothrombin Time: 12.9 seconds (ref 11.4–15.2)

## 2019-12-04 LAB — PROCALCITONIN: Procalcitonin: <0.1 ng/mL

## 2019-12-04 MED ORDER — SODIUM CHLORIDE 0.9 % IV SOLN: INTRAVENOUS | Status: DC

## 2019-12-04 MED ORDER — SODIUM CHLORIDE 0.9 % IV SOLN
1.0000 g | INTRAVENOUS | Status: DC
  Administered 2019-12-05: 1 g via INTRAVENOUS
  Filled 2019-12-04: qty 10
  Filled 2019-12-04: qty 1

## 2019-12-04 MED ORDER — ACETAMINOPHEN 325 MG PO TABS: 650.0000 mg | ORAL_TABLET | Freq: Four times a day (QID) | ORAL | Status: DC | PRN

## 2019-12-04 MED ORDER — DM-GUAIFENESIN ER 30-600 MG PO TB12: 1.0000 | ORAL_TABLET | Freq: Two times a day (BID) | ORAL | Status: DC | PRN

## 2019-12-04 MED ORDER — ENOXAPARIN SODIUM 40 MG/0.4ML ~~LOC~~ SOLN
40.0000 mg | SUBCUTANEOUS | Status: DC
  Administered 2019-12-04 – 2019-12-05 (×2): 40 mg via SUBCUTANEOUS
  Filled 2019-12-04 (×2): qty 0.4

## 2019-12-04 MED ORDER — ALBUTEROL SULFATE (2.5 MG/3ML) 0.083% IN NEBU: 2.5000 mg | INHALATION_SOLUTION | RESPIRATORY_TRACT | Status: DC | PRN

## 2019-12-04 MED ORDER — EZETIMIBE 10 MG PO TABS
10.0000 mg | ORAL_TABLET | Freq: Every day | ORAL | Status: DC
  Administered 2019-12-04 – 2019-12-06 (×3): 10 mg via ORAL
  Filled 2019-12-04 (×3): qty 1

## 2019-12-04 MED ORDER — ONDANSETRON HCL 4 MG/2ML IJ SOLN: 4.0000 mg | Freq: Three times a day (TID) | INTRAMUSCULAR | Status: DC | PRN

## 2019-12-04 MED ORDER — IOHEXOL 350 MG/ML SOLN
60.0000 mL | Freq: Once | INTRAVENOUS | Status: AC | PRN
  Administered 2019-12-04: 60 mL via INTRAVENOUS

## 2019-12-04 MED ORDER — POTASSIUM CHLORIDE CRYS ER 20 MEQ PO TBCR
60.0000 meq | EXTENDED_RELEASE_TABLET | Freq: Once | ORAL | Status: AC
  Administered 2019-12-04: 60 meq via ORAL
  Filled 2019-12-04: qty 3

## 2019-12-04 MED ORDER — IPRATROPIUM-ALBUTEROL 0.5-2.5 (3) MG/3ML IN SOLN
3.0000 mL | RESPIRATORY_TRACT | Status: DC
  Filled 2019-12-04: qty 3

## 2019-12-04 MED ORDER — SODIUM CHLORIDE 0.9 % IV SOLN
1.0000 g | Freq: Once | INTRAVENOUS | Status: AC
  Administered 2019-12-04: 1 g via INTRAVENOUS
  Filled 2019-12-04: qty 10

## 2019-12-04 MED ORDER — ASPIRIN EC 81 MG PO TBEC
81.0000 mg | DELAYED_RELEASE_TABLET | Freq: Every day | ORAL | Status: DC
  Administered 2019-12-04 – 2019-12-06 (×3): 81 mg via ORAL
  Filled 2019-12-04 (×3): qty 1

## 2019-12-04 NOTE — ED Notes (Signed)
Date and time results received: 12/04/19 1356 (use smartphrase ".now" to insert current time)  Test: Lactic Acid Critical Value: 2.8  Name of Provider Notified: Paduchowski, MD  Orders Received? Or Actions Taken?: Orders Received - See Orders for details

## 2019-12-04 NOTE — ED Provider Notes (Signed)
[facility] Emergency Department Provider Note  Time seen: 12:39 PM  I have reviewed the triage vital signs and the nursing notes.   HISTORY  Chief Complaint Urinary Tract Infection and Weakness   HPI Samantha Saunders is a 86 y.o. female with a past medical history of hypertension, hyperlipidemia, presents to the emergency department for generalized fatigue and weakness.  According to the patient and daughter patient was diagnosed with urinary tract infection on Monday was started on antibiotics.  Patient has been feeling progressively more weak as well as short of breath.  Patient followed up at [facility] clinic today was noted to be hypoxic 78% on room air with tachycardic.  Patient was placed on nasal cannula oxygen and sent to the emergency department for further evaluation.  Patient currently on 3 L nasal cannula satting 100%.  Does not wear O2 at baseline.  Denies any chest pain.  Largely negative review of systems otherwise.  Denies any abdominal pain nausea vomiting diarrhea, urinary symptoms or fever.  Patient has been vaccinated against Covid but has not yet received her booster shot.   Past Medical History:  Diagnosis Date  . Heart murmur   . History of kidney infection   . Hyperlipidemia   . Hypertension   . LBBB (left bundle branch block)   . Syncope and collapse     Patient Active Problem List   Diagnosis Date Noted  . Hypokalemia 07/08/2018  . Rectocele 10/19/2016  . 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 SOB (shortness of breath) 10/23/2013  . Pre-syncope 04/23/2013  . Hypertension     Past Surgical History:  Procedure Laterality Date  . ABDOMINAL HYSTERECTOMY      Prior to Admission medications   Medication Sig Start Date End Date Taking? Authorizing Provider  amLODipine (NORVASC) 10 MG tablet Take 1 tablet (10 mg total) by mouth daily. 04/21/13   Iran Ouch, MD  aspirin 81 MG tablet Take 81 mg by mouth daily.     [provider]  ezetimibe (ZETIA) 10 MG tablet Take 10 mg by mouth daily.    [provider]  potassium chloride (KLOR-CON) 20 MEQ packet Take 40 mEq by mouth 2 (two) times daily for 3 days. 07/09/18 07/12/18  Enedina Finner, MD    Allergies  Allergen Reactions  . Statins Hives    Family History  Problem Relation Age of Onset  . Stroke Mother   . Hypertension Mother     Social History Social History   Tobacco Use  . Smoking status: Never Smoker  . Smokeless tobacco: Never Used  Vaping Use  . Vaping Use: Never used  Substance Use Topics  . Alcohol use: No  . Drug use: No    Review of Systems Constitutional: Negative for fever.  Positive for generalized fatigue/weakness Cardiovascular: Negative for chest pain. Respiratory: Mild shortness of breath Gastrointestinal: Negative for abdominal pain, vomiting and diarrhea. Genitourinary: Negative for urinary compaints Musculoskeletal: Negative for musculoskeletal complaints Neurological: Negative for headache All other ROS negative  ____________________________________________   PHYSICAL EXAM:  VITAL SIGNS: ED Triage Vitals [12/04/19 1219]  Enc Vitals Group     BP 96/64     Pulse Rate (!) 125     Resp (!) 22     Temp 97.8 F (36.6 C)     Temp Source Oral     SpO2 (!) 78 %     Weight 145 lb (65.8 kg)     Height  5\' 6"  (1.676 m)     Head Circumference      Peak Flow      Pain Score 0     Pain Loc      Pain Edu?      Excl. in GC?     Constitutional: Alert and oriented. Well appearing and in no distress. Eyes: Normal exam ENT      Head: Normocephalic and atraumatic.      Mouth/Throat: Mucous membranes are moist. Cardiovascular: Normal rate, regular rhythm around 120bpm Respiratory: Mild tachypnea without increased respiratory effort.  Breath sounds are clear without obvious wheeze rales rhonchi. Gastrointestinal: Soft and nontender. No distention.   Musculoskeletal: Nontender with normal range  of motion in all extremities. No lower extremity tenderness or edema. Neurologic:  Normal speech and language. No gross focal neurologic deficits Skin:  Skin is warm, dry and intact.  Psychiatric: Mood and affect are normal.  ____________________________________________    EKG  EKG viewed and interpreted by myself shows what appears to be sinus tachycardia versus SVT at 143 bpm, narrow QRS, normal axis, nonspecific ST changes with signs of mild demand ischemia in the lateral leads.  On my evaluation of the cardiac monitor patient appears to be in a sinus rhythm currently around 90 bpm will occasionally become tachycardic around 120 bpm and then revert back to 90 bpm.  ____________________________________________    RADIOLOGY  Chest x-ray negative  ____________________________________________   INITIAL IMPRESSION / ASSESSMENT AND PLAN / ED COURSE  Pertinent labs & imaging results that were available during my care of the patient were reviewed by me and considered in my medical decision making (see chart for details).   Patient presents emergency department for generalized weakness found to be hypoxic and tachycardic at her PCP.  Patient appears to flip between sinus tachycardia and normal sinus rhythm between 90 bpm and 130 bpm.  Currently around 90 bpm.  Satting 100% on 3 L.  Was 78% on room air.  We will check labs including cardiac enzymes BNP.  We will obtain a Covid swab and a chest x-ray.  Given the recently diagnosed UTI we will obtain a urinalysis as well.  Patient agreeable to plan of care.  Daughter agreeable.  Patient's work-up shows an elevated lactate of 2.8.  Urinalysis shows greater than 50 white cells with rare bacteria and white blood cell clumps.  We will cover with IV Rocephin.  Given the patient's hypoxia initially and tachycardia and a normal chest x-ray we will order a CTA of the chest to rule out pulmonary embolism.  CTA is pending, anticipate likely admission to  the hospitalist service given the patient's generalized weakness elevated lactic acid and urinary tract infection.  Patient care signed out to Dr. .  [name] was evaluated in Emergency Department on 12/04/2019 for the symptoms described in the history of present illness. She was evaluated in the context of the global COVID-19 pandemic, which necessitated consideration that the patient might be at risk for infection with the SARS-CoV-2 virus that causes COVID-19. Institutional protocols and algorithms that pertain to the evaluation of patients at risk for COVID-19 are in a state of rapid change based on information released by regulatory bodies including the CDC and federal and state organizations. These policies and algorithms were followed during the patient's care in the ED.  ____________________________________________   FINAL CLINICAL IMPRESSION(S) / ED DIAGNOSES  Weakness Hypoxia Tachycardia UTI   12/06/2019, MD 12/04/19 1519

## 2019-12-04 NOTE — ED Triage Notes (Signed)
Pt to ER with daughter from [facility] with reports of UTI that was diagnosed on Monday and seems to be getting worse.  Pt also reports SHOB and feeling weak.  Sent From [facility] due to abnormal VS

## 2019-12-04 NOTE — ED Triage Notes (Signed)
First RN Note: pt to ED via wheelchair from [facility] with c/o hypotension and low O2 sats. Per [facility] staff, pt 78% on RA, BP 74 systolic, [facility] staff reports UTI that is no better after treatment. Pt to ED on 3L via [facility] from [facility].

## 2019-12-04 NOTE — H&P (Signed)
History and Physical    Samantha Saunders:[id number] DOB: May 18, 1926 DOA: 12/04/2019  Referring MD/NP/PA:   PCP: Toy Cookey, FNP   Patient coming from:  The patient is coming from home.  At baseline, pt is partially dependent for most of ADL.        Chief Complaint: Burning on urination, increased urinary frequency  HPI: Samantha Saunders is a 86 y.o. female with medical history significant of hypertension, hyperlipidemia, syncope, left bundle blockade, who presents with burning on urination, increased urinary frequency.  Patient states that she has been having urinary frequency and burning on urination for almost a week.  Patient was diagnosed with a UTI on Monday, started with antibiotics, but patient does not know the name of antibiotics.  Today patient was seen in clinic for follow-up with her doctor, and was found to have hypotension with SBP 74, oxygen desaturation to 78% on room air.  Patient is not using oxygen at home.  Patient has intermittent shortness of breath, no cough, chest pain.  Denies fever or chills.  No nausea vomiting, diarrhea or abdominal pain.  No unilateral numbness or tingling his extremities.   ED Course: pt was found to have positive urinalysis (cloudy appearance, large amount of leukocyte, rare bacteria, WBC> 50), CBC 5.3, lactic acid 2.8, 1.8, INR 1.0, troponin level 3 --> 4, negative Covid PCR, potassium 3.3, creatinine 1.08, BUN 11, temperature normal, current blood pressure 124/51, heart rate 125, 74, RR 22, oxygen saturation 99% on 3 L oxygen.  Chest x-ray negative.  CT angiogram is negative for PE.  Patient is admitted to progressive bed as inpatient.  Review of Systems:   General: no fevers, chills, no body weight gain, has fatigue HEENT: no blurry vision, hearing changes or sore throat Respiratory: has dyspnea, no coughing, wheezing CV: no chest pain, no palpitations GI: no nausea, vomiting, abdominal pain, diarrhea, constipation GU: no dysuria, has  burning on urination, increased urinary frequency, no hematuria  Ext: no leg edema Neuro: no unilateral weakness, numbness, or tingling, no vision change or hearing loss Skin: no rash, no skin tear. MSK: No muscle spasm, no deformity, no limitation of range of movement in spin Heme: No easy bruising.  Travel history: No recent long distant travel.  Allergy:  Allergies  Allergen Reactions   Statins Hives    Past Medical History:  Diagnosis Date   Heart murmur    History of kidney infection    Hyperlipidemia    Hypertension    LBBB (left bundle branch block)    Syncope and collapse     Past Surgical History:  Procedure Laterality Date   ABDOMINAL HYSTERECTOMY      Social History:  reports that she has never smoked. She has never used smokeless tobacco. She reports that she does not drink alcohol and does not use drugs.  Family History:  Family History  Problem Relation Age of Onset   Stroke Mother    Hypertension Mother      Prior to Admission medications   Medication Sig Start Date End Date Taking? Authorizing Provider  amLODipine (NORVASC) 10 MG tablet Take 1 tablet (10 mg total) by mouth daily. 04/21/13  Yes Iran Ouch, MD  aspirin 81 MG tablet Take 81 mg by mouth daily.   Yes [provider]  ezetimibe (ZETIA) 10 MG tablet Take 10 mg by mouth daily.   Yes [provider]  nitrofurantoin, macrocrystal-monohydrate, (MACROBID) 100 MG capsule Take 100 mg by mouth 2 (  two) times daily. 11/30/19 12/07/19 Yes [provider]    Physical Exam: Vitals:   12/04/19 1430 12/04/19 1500 12/04/19 1545 12/04/19 1600  BP: 135/63 (!) 124/51  129/65  Pulse: 68 66 74 76  Resp: (!) 21 17 14 16   Temp:      TempSrc:      SpO2: 100% 100% 99% 100%  Weight:      Height:       General: Not in acute distress HEENT:       Eyes: PERRL, EOMI, no scleral icterus.       ENT: No discharge from the ears and nose, no pharynx injection, no tonsillar  enlargement.        Neck: No JVD, no bruit, no mass felt. Heme: No neck lymph node enlargement. Cardiac: S1/S2, RRR, 3/6 systolic murmurs, No gallops or rubs. Respiratory: No rales, wheezing, rhonchi or rubs. GI: Soft, nondistended, nontender, no rebound pain, no organomegaly, BS present. GU: No hematuria Ext: No pitting leg edema bilaterally. 2+DP/PT pulse bilaterally. Musculoskeletal: No joint deformities, No joint redness or warmth, no limitation of ROM in spin. Skin: No rashes.  Neuro: Alert, oriented X3, cranial nerves II-XII grossly intact, moves all extremities normally. Psych: Patient is not psychotic, no suicidal or hemocidal ideation.  Labs on Admission: I have personally reviewed following labs and imaging studies  CBC: Recent Labs  Lab 12/04/19 1255  WBC 5.3  NEUTROABS 3.6  HGB 12.5  HCT 38.0  MCV 88.6  PLT 263   Basic Metabolic Panel: Recent Labs  Lab 12/04/19 1255  NA 135  K 3.3*  CL 97*  CO2 25  GLUCOSE 194*  BUN 11  CREATININE 1.08*  CALCIUM 9.0  MG 2.1   GFR: Estimated Creatinine Clearance: 31.8 mL/min (A) (by C-G formula based on SCr of 1.08 mg/dL (H)). Liver Function Tests: Recent Labs  Lab 12/04/19 1255  AST 30  ALT 15  ALKPHOS 43  BILITOT 0.7  PROT 7.6  ALBUMIN 3.8   No results for input(s): LIPASE, AMYLASE in the last 168 hours. No results for input(s): AMMONIA in the last 168 hours. Coagulation Profile: Recent Labs  Lab 12/04/19 1255  INR 1.0   Cardiac Enzymes: No results for input(s): CKTOTAL, CKMB, CKMBINDEX, TROPONINI in the last 168 hours. BNP (last 3 results) No results for input(s): PROBNP in the last 8760 hours. HbA1C: No results for input(s): HGBA1C in the last 72 hours. CBG: No results for input(s): GLUCAP in the last 168 hours. Lipid Profile: No results for input(s): CHOL, HDL, LDLCALC, TRIG, CHOLHDL, LDLDIRECT in the last 72 hours. Thyroid Function Tests: No results for input(s): TSH, T4TOTAL, FREET4, T3FREE,  THYROIDAB in the last 72 hours. Anemia Panel: No results for input(s): VITAMINB12, FOLATE, FERRITIN, TIBC, IRON, RETICCTPCT in the last 72 hours. Urine analysis:    Component Value Date/Time   COLORURINE YELLOW (A) 12/04/2019 1221   APPEARANCEUR CLOUDY (A) 12/04/2019 1221   APPEARANCEUR Hazy 01/01/2014 1116   LABSPEC 1.005 12/04/2019 1221   LABSPEC 1.012 01/01/2014 1116   PHURINE 6.0 12/04/2019 1221   GLUCOSEU NEGATIVE 12/04/2019 1221   GLUCOSEU Negative 01/01/2014 1116   HGBUR MODERATE (A) 12/04/2019 1221   BILIRUBINUR NEGATIVE 12/04/2019 1221   BILIRUBINUR Negative 01/01/2014 1116   KETONESUR NEGATIVE 12/04/2019 1221   PROTEINUR NEGATIVE 12/04/2019 1221   NITRITE NEGATIVE 12/04/2019 1221   LEUKOCYTESUR LARGE (A) 12/04/2019 1221   LEUKOCYTESUR 3+ 01/01/2014 1116   Sepsis Labs: @LABRCNTIP (procalcitonin:4,lacticidven:4) ) Recent Results (from the past  240 hour(s))  Resp Panel by RT-PCR (Flu A&B, Covid) Nasopharyngeal Swab     Status: None   Collection Time: 12/04/19 12:40 PM   Specimen: Nasopharyngeal Swab; Nasopharyngeal(NP) swabs in vial transport medium  Result Value Ref Range Status   SARS Coronavirus 2 by RT PCR NEGATIVE NEGATIVE Final    Comment: (NOTE) SARS-CoV-2 target nucleic acids are NOT DETECTED.  The SARS-CoV-2 RNA is generally detectable in upper respiratory specimens during the acute phase of infection. The lowest concentration of SARS-CoV-2 viral copies this assay can detect is 138 copies/mL. A negative result does not preclude SARS-Cov-2 infection and should not be used as the sole basis for treatment or other patient management decisions. A negative result may occur with  improper specimen collection/handling, submission of specimen other than nasopharyngeal swab, presence of viral mutation(s) within the areas targeted by this assay, and inadequate number of viral copies(<138 copies/mL). A negative result must be combined with clinical observations,  patient history, and epidemiological information. The expected result is Negative.  Fact Sheet for Patients:  BloggerCourse.com  Fact Sheet for Healthcare Providers:  SeriousBroker.it  This test is no t yet approved or cleared by the [place] FDA and  has been authorized for detection and/or diagnosis of SARS-CoV-2 by FDA under an Emergency Use Authorization (EUA). This EUA will remain  in effect (meaning this test can be used) for the duration of the COVID-19 declaration under Section 564(b)(1) of the Act, 21 U.S.C.section 360bbb-3(b)(1), unless the authorization is terminated  or revoked sooner.       Influenza A by PCR NEGATIVE NEGATIVE Final   Influenza B by PCR NEGATIVE NEGATIVE Final    Comment: (NOTE) The Xpert Xpress SARS-CoV-2/FLU/RSV plus assay is intended as an aid in the diagnosis of influenza from Nasopharyngeal swab specimens and should not be used as a sole basis for treatment. Nasal washings and aspirates are unacceptable for Xpert Xpress SARS-CoV-2/FLU/RSV testing.  Fact Sheet for Patients: BloggerCourse.com  Fact Sheet for Healthcare Providers: SeriousBroker.it  This test is not yet approved or cleared by the [place] FDA and has been authorized for detection and/or diagnosis of SARS-CoV-2 by FDA under an Emergency Use Authorization (EUA). This EUA will remain in effect (meaning this test can be used) for the duration of the COVID-19 declaration under Section 564(b)(1) of the Act, 21 U.S.C. section 360bbb-3(b)(1), unless the authorization is terminated or revoked.  Performed at [facility], [address] [place], [place] [address]      Radiological Exams on Admission: CT Angio Chest PE W and/or Wo Contrast  Result Date: 12/04/2019 CLINICAL DATA:  Chest pain and shortness of breath.  Weakness. EXAM: CT ANGIOGRAPHY CHEST WITH  CONTRAST TECHNIQUE: Multidetector CT imaging of the chest was performed using the standard protocol during bolus administration of intravenous contrast. Multiplanar CT image reconstructions and MIPs were obtained to evaluate the vascular anatomy. CONTRAST:  87mL OMNIPAQUE IOHEXOL 350 MG/ML SOLN COMPARISON:  Chest radiography same day FINDINGS: Cardiovascular: Heart size is normal. There is a small amount of pericardial fluid. Coronary artery calcification is present. Aortic atherosclerotic calcification is present. Pulmonary arterial opacification is good. There are no pulmonary emboli. Mediastinum/Nodes: No mediastinal or hilar mass or lymphadenopathy. Lungs/Pleura: No emphysema. Minimal pleural and parenchymal scarring at the apices. No infiltrate, mass, effusion or collapse. Upper Abdomen: Nonobstructing bilateral renal calculi. Musculoskeletal: Minimal thoracic degenerative changes. No acute finding. Review of the MIP images confirms the above findings. IMPRESSION: 1. No pulmonary emboli or other acute  chest pathology. 2. Aortic atherosclerosis. Coronary artery calcification. Small amount of pericardial fluid. 3. Nonobstructing bilateral renal calculi. Aortic Atherosclerosis (ICD10-I70.0). Electronically Signed   By: Paulina FusiMark  Shogry M.D.   On: 12/04/2019 15:46   DG Chest Portable 1 View  Result Date: 12/04/2019 CLINICAL DATA:  Hypoxia. EXAM: PORTABLE CHEST 1 VIEW COMPARISON:  April 01, 2017. FINDINGS: Similar cardiomediastinal silhouette. Aortic atherosclerosis. Both lungs are clear. No visible pleural effusions or pneumothorax. Eventration of the medial left hemidiaphragm. No acute osseous abnormality. IMPRESSION: No acute cardiopulmonary disease. Electronically Signed   By: Feliberto HartsFrederick S Jones MD   On: 12/04/2019 13:58     EKG: I have personally reviewed.  Sinus rhythm, possible SVT, QTC 334, low voltage, poor R wave progression  Assessment/Plan Principal Problem:   UTI (urinary tract  infection) Active Problems:   Hypertension   Hypokalemia   Acute respiratory failure with hypoxia (HCC)   Severe sepsis (HCC)   HLD (hyperlipidemia)   Severe sepsis due to UTI: Patient failed outpatient oral antibiotic treatment.  Now has severe sepsis with tachycardia, tachypnea, elevated lactic acid.  Patient also had hypotension in clinic, currently blood pressure is 124/51.  Lactic acid 2.8 --> 1.8 without IV fluid treatment.   -Admitted to progressive bed as inpatient -IV Rocephin -Follow-up blood culture and urine culture -will get Procalcitonin -IVF: will give gentle IVF, 75 cc/h of NS  Hypertension -Hold Bp med due to hypotension and sepsis\  Hypokalemia: K= 3.3 on admission. - Repleted - Check Mg level  Acute respiratory failure with hypoxia [facility](HCC): Etiology is not clear.  CT angiogram is negative for PE.  Chest x-ray negative.  Covid PCR negative.  Patient had 2D echo on 12/23/2015 which showed EF 60 to 65% with grade 1 diastolic dysfunction, but patient does not have any leg edema.  BNP is normal 53.  Does not seem to have CHF exacerbation.  No wheezing or rhonchi on auscultation, does not seem to have COPD on asthma. -Supportive care -Nasal cannula oxygen to maintain oxygen saturation above 93% -DuoNeb nebulizer, and as needed albuterol nebulizer -Get 2D echo for further evaluation of diastolic dysfunction.  HLD (hyperlipidemia) -Zetia         DVT ppx: SQ Lovenox Code Status: DNR per her daughter who is POA Family Communication: daughter is at bedside Disposition Plan:  Anticipate discharge back to previous environment Consults called:  none Admission status:   progressive unit as inpt       Status is: Inpatient  Remains inpatient appropriate because:Inpatient level of care appropriate due to severity of illness.  Patient has multiple comorbidities, now presents with severe sepsis due to UTI, acute respiratory failure with hypoxia with unclear etiology, also  has hypokalemia.  Her presentation is highly complicated.  Patient is at high risk of deteriorating given her old age.  We need to be treated in hospital for at least 2 days.   Dispo: The patient is from: Home              Anticipated d/c is to: Home              Anticipated d/c date is: 2 days              Patient currently is not medically stable to d/c.         Date of Service 12/04/2019    Lorretta HarpXilin Pavel Gadd [facility]   If 7PM-7AM, please contact night-coverage [email or web address] 12/04/2019, 5:52 PM

## 2019-12-04 NOTE — Progress Notes (Signed)
Respiratory at bedside evaluating patient and Mrs Scalf is on room air and no distress. Patient has no history of COPD or breathing issues. Pt does not and has not taken breathing treatments ever and doesn't feel short of breath. Pt states she doesn't want to take it if she doesn't have to. Pt is alert and oriented times 4 and understands well when asking her questions. At this time I am going to cancel the scheduled order for Duonebs q4 and keep the PRN just in case if we ever need them and I told pt to call if she needs and she agreed that she would.

## 2019-12-04 NOTE — ED Notes (Signed)
Attempted to give report x1. Floor states that patient has not yet been approved and they will give a call back.

## 2019-12-05 ENCOUNTER — Inpatient Hospital Stay
Admit: 2019-12-05 | Discharge: 2019-12-05 | Disposition: A | Discharge status: Home / Self Care | Payer: Medicare Other | Attending: Internal Medicine | Admitting: Internal Medicine

## 2019-12-05 DIAGNOSIS — J9601 Acute respiratory failure with hypoxia: Secondary | ICD-10-CM | POA: Diagnosis not present

## 2019-12-05 DIAGNOSIS — A419 Sepsis, unspecified organism: Secondary | ICD-10-CM | POA: Diagnosis not present

## 2019-12-05 DIAGNOSIS — N10 Acute tubulo-interstitial nephritis: Secondary | ICD-10-CM | POA: Diagnosis not present

## 2019-12-05 DIAGNOSIS — R6520 Severe sepsis without septic shock: Secondary | ICD-10-CM | POA: Diagnosis not present

## 2019-12-05 LAB — CBC
HCT: 32.7 % — ABNORMAL LOW (ref 36.0–46.0)
Hemoglobin: 11.3 g/dL — ABNORMAL LOW (ref 12.0–15.0)
MCH: 30.7 pg (ref 26.0–34.0)
MCHC: 34.6 g/dL (ref 30.0–36.0)
MCV: 88.9 fL (ref 80.0–100.0)
Platelets: 241 10*3/uL (ref 150–400)
RBC: 3.68 MIL/uL — ABNORMAL LOW (ref 3.87–5.11)
RDW: 13 % (ref 11.5–15.5)
WBC: 5.1 10*3/uL (ref 4.0–10.5)
nRBC: 0 % (ref 0.0–0.2)

## 2019-12-05 LAB — BASIC METABOLIC PANEL
Anion gap: 5 (ref 5–15)
BUN: 11 mg/dL (ref 8–23)
CO2: 27 mmol/L (ref 22–32)
Calcium: 8.4 mg/dL — ABNORMAL LOW (ref 8.9–10.3)
Chloride: 105 mmol/L (ref 98–111)
Creatinine, Ser: 0.71 mg/dL (ref 0.44–1.00)
GFR, Estimated: >60 mL/min (ref >60)
Glucose, Bld: 77 mg/dL (ref 70–99)
Potassium: 4 mmol/L (ref 3.5–5.1)
Sodium: 137 mmol/L (ref 135–145)

## 2019-12-05 LAB — TSH: TSH: 1.323 u[IU]/mL (ref 0.350–4.500)

## 2019-12-05 NOTE — Progress Notes (Signed)
*  PRELIMINARY RESULTS* Echocardiogram 2D Echocardiogram has been performed.  Cristela Blue 12/05/2019, 8:46 AM

## 2019-12-05 NOTE — Plan of Care (Signed)

## 2019-12-05 NOTE — Evaluation (Signed)
Physical Therapy Evaluation Patient Details Name: Samantha Saunders MRN: [id number] DOB: 1926/06/08 Today's Date: 12/05/2019   History of Present Illness  TERISHA LOSASSO is a 86 y.o. female with medical history significant of hypertension, hyperlipidemia, syncope, left bundle blockade, who presents with burning on urination, increased urinary frequency and diagnosed with UTI.  Clinical Impression  86 yo Female presents to hospital with UTI. She reports being independent in all self care prior to admittance. She was living in senior living apartment, alone. She reports being active, walking without AD with no recent falls. Patient is mod I for bed mobility, she was able to complete sit<>stand transfers independently. Patient ambulated 175 feet without AD with good reciprocal gait pattern. She does ambulate with slightly slower gait speed of 1.9 feet/sec indicative of limited community ambulator. However patient reports this is her baseline. She reports feeling like she is functioning at PLOF. BLE gross strength is WFL grossly 4/5. Patient does not demonstrate need for additional skilled PT intervention as she is independent/mod I with mobility. She also exhibits good balance awareness being able to hold narrow stance with eyes open/closed without loss of balance. Patient is appropriate to return home based on her current mobility without additional skilled PT intervention. Patient agreeable. RN informed of patient's functional status. Patient left in room without bed alarm as she is independent and exhibits low fall risk. RN verbalized understanding and agreement.     Follow Up Recommendations No PT follow up    Equipment Recommendations  None recommended by PT    Recommendations for Other Services       Precautions / Restrictions Precautions Precautions: Fall Restrictions Weight Bearing Restrictions: No      Mobility  Bed Mobility Overal bed mobility: Modified Independent              General bed mobility comments: not formally assessed, patient sitting edge of bed at start of PT evaluation;    Transfers Overall transfer level: Independent Equipment used: None Transfers: Sit to/from Stand Sit to Stand: Independent Stand pivot transfers: Supervision       General transfer comment: patient exhibits good safety awareness when transferring sit to stand from bed without AD, pt steady  Ambulation/Gait Ambulation/Gait assistance: Modified independent (Device/Increase time) Gait Distance (Feet): 175 Feet Assistive device: None Gait Pattern/deviations: Step-through pattern Gait velocity: 1.9 feet/sec Gait velocity interpretation: 1.31 - 2.62 ft/sec, indicative of limited community ambulator General Gait Details: patient ambulates with step through gait pattern, good foot clearance, normal base of support, erect posture; She is able to turn around without unsteadiness with good safety awareness. She does walk at slightly slower gait speed indicative of limited Programmer, multimedia Rankin (Stroke Patients Only)       Balance Overall balance assessment: Modified Independent Sitting-balance support: Feet supported Sitting balance-Leahy Scale: Normal Sitting balance - Comments: no LOB   Standing balance support: During functional activity Standing balance-Leahy Scale: Good Standing balance comment: pt ambulated without AD with good safety awareness; Single Leg Stance - Right Leg: 3 Single Leg Stance - Left Leg: 5     Rhomberg - Eyes Opened: 30 Rhomberg - Eyes Closed: 15 (minimal to no sway)   High Level Balance Comments: pt able to hold modified tandem stance for 10 sec each foot in front with minimal difficulty;             Pertinent  Vitals/Pain Pain Assessment: No/denies pain    Home Living Family/patient expects to be discharged to:: Private residence (senior living apartments) Living  Arrangements: Alone Available Help at Discharge: Family;Available PRN/intermittently Type of Home: House Home Access: Level entry     Home Layout: One level Home Equipment: Cane - single point      Prior Function Level of Independence: Independent         Comments: Pt lives on her own and performs all aspects of self care without assistance, cooks, cleans, and does her own laundry without assistance. Very active.     Hand Dominance   Dominant Hand: Right    Extremity/Trunk Assessment   Upper Extremity Assessment Upper Extremity Assessment: Generalized weakness    Lower Extremity Assessment Lower Extremity Assessment: Generalized weakness       Communication   Communication: No difficulties  Cognition Arousal/Alertness: Awake/alert Behavior During Therapy: WFL for tasks assessed/performed Overall Cognitive Status: Impaired/Different from baseline Area of Impairment: Orientation                 Orientation Level: Disoriented to;Time             General Comments: Patient oriented to birthday, place, situation and person during PT evaluation      General Comments      Exercises     Assessment/Plan    PT Assessment Patent does not need any further PT services  PT Problem List         PT Treatment Interventions      PT Goals (Current goals can be found in the Care Plan section)  Acute Rehab PT Goals Patient Stated Goal: to go home PT Goal Formulation: With patient Time For Goal Achievement: 12/05/19 Potential to Achieve Goals: Good    Frequency     Barriers to discharge        Co-evaluation               AM-PAC PT "6 Clicks" Mobility  Outcome Measure Help needed turning from your back to your side while in a flat bed without using bedrails?: None Help needed moving from lying on your back to sitting on the side of a flat bed without using bedrails?: None Help needed moving to and from a bed to a chair (including a  wheelchair)?: None Help needed standing up from a chair using your arms (e.g., wheelchair or bedside chair)?: None Help needed to walk in hospital room?: None Help needed climbing 3-5 steps with a railing? : A Little 6 Click Score: 23    End of Session Equipment Utilized During Treatment: Gait belt Activity Tolerance: Patient tolerated treatment well Patient left: in bed;with call bell/phone within reach;Other (comment) (no alarm set, RN aware, patient mod I) Nurse Communication: Mobility status      Time: 1400-1420 PT Time Calculation (min) (ACUTE ONLY): 20 min   Charges:   PT Evaluation $PT Eval Low Complexity: 1 Low          Zilah Villaflor PT, DPT 12/05/2019, 2:38 PM

## 2019-12-05 NOTE — Progress Notes (Signed)
PROGRESS NOTE    Samantha Saunders  JOA:[id number] DOB: 07-08-1926 DOA: 12/04/2019 PCP: Gennette Pac, FNP   Chief complaint.  Dysuria. Brief Narrative:  Samantha Saunders is a 86 y.o. female with medical history significant of hypertension, hyperlipidemia, syncope, left bundle blockade, who presents with burning on urination, increased urinary frequency.  Patient was also complaining of right flank pain and generalized weakness.  No fever or chills. Upon arriving the emergency room, she had a transient hypoxia.  Oxygen saturation was 78% on room air, she was placed on 3 L oxygen.  CT angiogram of chest negative for PE, BNP was normal.   Assessment & Plan:   Principal Problem:   UTI (urinary tract infection) Active Problems:   Hypertension   Hypokalemia   Acute respiratory failure with hypoxia (HCC)   Severe sepsis (HCC)   HLD (hyperlipidemia)  #1.  Severe sepsis secondary to acute pyelonephritis. Acute pyelonephritis. Patient met sepsis criteria at time of admission with tachycardia, tachypnea, elevated lactic acid and acute hypoxemia. She also has urinary symptoms, in addition to right flank pain.  She has right CVA tenderness.  CT scan did not show any obstruction.  She has acute pyelonephritis at admission. Blood cultures and urine culture are pending, will continue antibiotics with Rocephin. Patient currently is hemodynamically stable.  Will discontinue IV fluids.  2.  Hypokalemia. Potassium normalized.  3.  Acute hypoxemic respiratory failure. Patient had a transient hypoxemia with ambulation.  Currently off oxygen.  No evidence of volume overload, no evidence of PE.  No evidence of COPD.  Assume this is from sepsis.  Condition has resolved.     DVT prophylaxis: Loevoex Code Status: DNR Family Communication: Daughter at bedside. Disposition Plan:  .   Status is: Inpatient  Remains inpatient appropriate because:Inpatient level of care appropriate due to severity of  illness   Dispo: The patient is from: Home              Anticipated d/c is to: Home              Anticipated d/c date is: 1 day              Patient currently is not medically stable to d/c.        I/O last 3 completed shifts: In: 617.8 [I.V.:517.9; IV Piggyback:100] Out: -  No intake/output data recorded.     Consultants:   None  Procedures: None  Antimicrobials:  Rocephin.  Subjective: Patient doing well today.  Not short of breath, no additional hypoxemia. Still has some right flank pain, dysuria has resolved.  No abdominal pain or nausea vomiting.  No diarrhea constipation. Denies any chest pain or palpitation. No fever chills.  Objective: Vitals:   12/04/19 2021 12/05/19 0419 12/05/19 0726 12/05/19 1123  BP: 119/62 (!) 121/53 136/68 119/61  Pulse: 71 75 79 73  Resp: _0 Temp: 98.2 F (36.8 C) 98.4 F (36.9 C) 97.8 F (36.6 C) (!) 97.5 F (36.4 C)  TempSrc: Oral Oral Oral Oral  SpO2: 95% 99% 100% 100%  Weight:  54.7 kg    Height:        Intake/Output Summary (Last 24 hours) at 12/05/2019 1309 Last data filed at 12/05/2019 1125 Gross per 24 hour  Intake 617.81 ml  Output 0 ml  Net 617.81 ml   Filed Weights   12/04/19 1219 12/05/19 0419  Weight: 65.8 kg 54.7 kg    Examination:  General exam: Appears calm  and comfortable  Respiratory system: Clear to auscultation. Respiratory effort normal. Cardiovascular system: S1 & S2 heard, RRR. No JVD, murmurs, rubs, gallops or clicks. No pedal edema. Gastrointestinal system: Abdomen is nondistended, soft and nontender. No organomegaly or masses felt. Normal bowel sounds heard. Central nervous system: Alert and oriented. No focal neurological deficits. Extremities: Symmetric  Skin: No rashes, lesions or ulcers Psychiatry: Mood & affect appropriate.     Data Reviewed: I have personally reviewed following labs and imaging studies  CBC: Recent Labs  Lab 12/04/19 1255 12/05/19 0417  WBC  5.3 5.1  NEUTROABS 3.6  --   HGB 12.5 11.3*  HCT 38.0 32.7*  MCV 88.6 88.9  PLT 263 702   Basic Metabolic Panel: Recent Labs  Lab 12/04/19 1255 12/05/19 0417  NA 135 137  K 3.3* 4.0  CL 97* 105  CO2 25 27  GLUCOSE 194* 77  BUN 11 11  CREATININE 1.08* 0.71  CALCIUM 9.0 8.4*  MG 2.1  --    GFR: Estimated Creatinine Clearance: 39.6 mL/min (by C-G formula based on SCr of 0.71 mg/dL). Liver Function Tests: Recent Labs  Lab 12/04/19 1255  AST 30  ALT 15  ALKPHOS 43  BILITOT 0.7  PROT 7.6  ALBUMIN 3.8   No results for input(s): LIPASE, AMYLASE in the last 168 hours. No results for input(s): AMMONIA in the last 168 hours. Coagulation Profile: Recent Labs  Lab 12/04/19 1255  INR 1.0   Cardiac Enzymes: No results for input(s): CKTOTAL, CKMB, CKMBINDEX, TROPONINI in the last 168 hours. BNP (last 3 results) No results for input(s): PROBNP in the last 8760 hours. HbA1C: No results for input(s): HGBA1C in the last 72 hours. CBG: No results for input(s): GLUCAP in the last 168 hours. Lipid Profile: No results for input(s): CHOL, HDL, LDLCALC, TRIG, CHOLHDL, LDLDIRECT in the last 72 hours. Thyroid Function Tests: Recent Labs    12/05/19 0417  TSH 1.323   Anemia Panel: No results for input(s): VITAMINB12, FOLATE, FERRITIN, TIBC, IRON, RETICCTPCT in the last 72 hours. Sepsis Labs: Recent Labs  Lab 12/04/19 1255 12/04/19 1421  PROCALCITON <0.10  --   LATICACIDVEN 2.8* 1.8    Recent Results (from the past 240 hour(s))  Resp Panel by RT-PCR (Flu A&B, Covid) Nasopharyngeal Swab     Status: None   Collection Time: 12/04/19 12:40 PM   Specimen: Nasopharyngeal Swab; Nasopharyngeal(NP) swabs in vial transport medium  Result Value Ref Range Status   SARS Coronavirus 2 by RT PCR NEGATIVE NEGATIVE Final    Comment: (NOTE) SARS-CoV-2 target nucleic acids are NOT DETECTED.  The SARS-CoV-2 RNA is generally detectable in upper respiratory specimens during the acute  phase of infection. The lowest concentration of SARS-CoV-2 viral copies this assay can detect is 138 copies/mL. A negative result does not preclude SARS-Cov-2 infection and should not be used as the sole basis for treatment or other patient management decisions. A negative result may occur with  improper specimen collection/handling, submission of specimen other than nasopharyngeal swab, presence of viral mutation(s) within the areas targeted by this assay, and inadequate number of viral copies(<138 copies/mL). A negative result must be combined with clinical observations, patient history, and epidemiological information. The expected result is Negative.  Fact Sheet for Patients:  EntrepreneurPulse.com.au  Fact Sheet for Healthcare Providers:  IncredibleEmployment.be  This test is no t yet approved or cleared by the [place] FDA and  has been authorized for detection and/or diagnosis of SARS-CoV-2 by FDA under an  Emergency Use Authorization (EUA). This EUA will remain  in effect (meaning this test can be used) for the duration of the COVID-19 declaration under Section 564(b)(1) of the Act, 21 U.S.C.section 360bbb-3(b)(1), unless the authorization is terminated  or revoked sooner.       Influenza A by PCR NEGATIVE NEGATIVE Final   Influenza B by PCR NEGATIVE NEGATIVE Final    Comment: (NOTE) The Xpert Xpress SARS-CoV-2/FLU/RSV plus assay is intended as an aid in the diagnosis of influenza from Nasopharyngeal swab specimens and should not be used as a sole basis for treatment. Nasal washings and aspirates are unacceptable for Xpert Xpress SARS-CoV-2/FLU/RSV testing.  Fact Sheet for Patients: EntrepreneurPulse.com.au  Fact Sheet for Healthcare Providers: IncredibleEmployment.be  This test is not yet approved or cleared by the [place] FDA and has been authorized for detection and/or diagnosis of  SARS-CoV-2 by FDA under an Emergency Use Authorization (EUA). This EUA will remain in effect (meaning this test can be used) for the duration of the COVID-19 declaration under Section 564(b)(1) of the Act, 21 U.S.C. section 360bbb-3(b)(1), unless the authorization is terminated or revoked.  Performed at [facility], [address] [place], [place] [address]   Culture, blood (Routine x 2)     Status: None (Preliminary result)   Collection Time: 12/04/19 12:55 PM   Specimen: BLOOD  Result Value Ref Range Status   Specimen Description BLOOD LEFT ANTECUBITAL  Final   Special Requests   Final    BOTTLES DRAWN AEROBIC AND ANAEROBIC Blood Culture adequate volume   Culture   Final    NO GROWTH < 24 HOURS Performed at [facility], [address] [place], [place] [address]    Report Status PENDING  Incomplete  Culture, blood (Routine x 2)     Status: None (Preliminary result)   Collection Time: 12/04/19 12:55 PM   Specimen: BLOOD  Result Value Ref Range Status   Specimen Description BLOOD RIGHT ARM  Final   Special Requests   Final    BOTTLES DRAWN AEROBIC AND ANAEROBIC Blood Culture results may not be optimal due to an inadequate volume of blood received in culture bottles   Culture   Final    NO GROWTH < 24 HOURS Performed at [facility], [address], [place], [place] [address]    Report Status PENDING  Incomplete         Radiology Studies: CT Angio Chest PE W and/or Wo Contrast  Result Date: 12/04/2019 CLINICAL DATA:  Chest pain and shortness of breath.  Weakness. EXAM: CT ANGIOGRAPHY CHEST WITH CONTRAST TECHNIQUE: Multidetector CT imaging of the chest was performed using the standard protocol during bolus administration of intravenous contrast. Multiplanar CT image reconstructions and MIPs were obtained to evaluate the vascular anatomy. CONTRAST:  77m OMNIPAQUE IOHEXOL 350 MG/ML SOLN COMPARISON:  Chest radiography same day FINDINGS:  Cardiovascular: Heart size is normal. There is a small amount of pericardial fluid. Coronary artery calcification is present. Aortic atherosclerotic calcification is present. Pulmonary arterial opacification is good. There are no pulmonary emboli. Mediastinum/Nodes: No mediastinal or hilar mass or lymphadenopathy. Lungs/Pleura: No emphysema. Minimal pleural and parenchymal scarring at the apices. No infiltrate, mass, effusion or collapse. Upper Abdomen: Nonobstructing bilateral renal calculi. Musculoskeletal: Minimal thoracic degenerative changes. No acute finding. Review of the MIP images confirms the above findings. IMPRESSION: 1. No pulmonary emboli or other acute chest pathology. 2. Aortic atherosclerosis. Coronary artery calcification. Small amount of pericardial fluid. 3. Nonobstructing bilateral renal  calculi. Aortic Atherosclerosis (ICD10-I70.0). Electronically Signed   By: Nelson Chimes M.D.   On: 12/04/2019 15:46   DG Chest Portable 1 View  Result Date: 12/04/2019 CLINICAL DATA:  Hypoxia. EXAM: PORTABLE CHEST 1 VIEW COMPARISON:  April 01, 2017. FINDINGS: Similar cardiomediastinal silhouette. Aortic atherosclerosis. Both lungs are clear. No visible pleural effusions or pneumothorax. Eventration of the medial left hemidiaphragm. No acute osseous abnormality. IMPRESSION: No acute cardiopulmonary disease. Electronically Signed   By: Margaretha Sheffield MD   On: 12/04/2019 13:58        Scheduled Meds: . aspirin EC  81 mg Oral Daily  . enoxaparin (LOVENOX) injection  40 mg Subcutaneous Q24H  . ezetimibe  10 mg Oral Daily   Continuous Infusions: . cefTRIAXone (ROCEPHIN)  IV       LOS: 1 day    Time spent: 25 minutes    Sharen Hones, MD [facility]   To contact the attending provider between 7A-7P or the covering provider during after hours 7P-7A, please log into the web site [email or web address] and access using universal [place] password for that web site. If you do not have the  password, please call the hospital operator.  12/05/2019, 1:09 PM

## 2019-12-05 NOTE — Evaluation (Signed)
Occupational Therapy Evaluation Patient Details Name: Samantha Saunders MRN: [id number] DOB: [birth year] Today's Date: 12/05/2019    History of Present Illness TERRINA DOCTER is a 86 y.o. female with medical history significant of hypertension, hyperlipidemia, syncope, left bundle blockade, who presents with burning on urination, increased urinary frequency and diagnosed with UTI.   Clinical Impression   Patient presenting with decreased I in self care, balance, functional mobility/transfer, endurance,strength, and safety awareness.  Patient reports living alone in senior living apartments and independence with self care needs PTA. Pt performs all ADLs and IADLs independently. She does not do much cooking but heats things up in the microwave and on stove top. Patient currently functioning at close supervision without use of AD for functional transfers, toileting needs, and standing for grooming tasks. Pt did need min cuing for orientation which caregiver reports is new confusion from baseline. Pt reports she uses calendar at home to stay oriented and remind self of appointments.  Patient will benefit from acute OT to increase overall independence in the areas of ADLs, functional mobility, and safety awareness in order to safely discharge home.    Follow Up Recommendations  Home health OT;Supervision - Intermittent    Equipment Recommendations  Tub/shower bench       Precautions / Restrictions Precautions Precautions: Fall      Mobility Bed Mobility Overal bed mobility: Modified Independent   General bed mobility comments: HOB elevated    Transfers Overall transfer level: Needs assistance Equipment used: None Transfers: Sit to/from Stand;Stand Pivot Transfers Sit to Stand: Supervision Stand pivot transfers: Supervision       General transfer comment: close supervision overall for self care tasks for safety with min cuing for safety awareness    Balance Overall balance assessment:  Needs assistance Sitting-balance support: Feet supported Sitting balance-Leahy Scale: Normal Sitting balance - Comments: no LOB   Standing balance support: During functional activity Standing balance-Leahy Scale: Good        ADL either performed or assessed with clinical judgement   ADL Overall ADL's : Needs assistance/impaired Eating/Feeding: Modified independent   Grooming: Wash/dry hands;Wash/dry face;Oral care;Supervision/safety;Standing     Toilet Transfer: Supervision/safety;Regular Social worker and Hygiene: Supervision/safety;Sit to/from stand    General ADL Comments: close supervision overall for self care tasks without use of AD     Vision Baseline Vision/History: Wears glasses Wears Glasses: At all times Patient Visual Report: No change from baseline              Pertinent Vitals/Pain Pain Assessment: No/denies pain     Hand Dominance Right   Extremity/Trunk Assessment Upper Extremity Assessment Upper Extremity Assessment: Generalized weakness   Lower Extremity Assessment Lower Extremity Assessment: Defer to PT evaluation       Communication Communication Communication: No difficulties   Cognition Arousal/Alertness: Awake/alert Behavior During Therapy: WFL for tasks assessed/performed Overall Cognitive Status: Impaired/Different from baseline Area of Impairment: Orientation      Orientation Level: Disoriented to;Time     General Comments: Pt verbalizing different month and then reporting upcoming holiday as "memorial day...no..I mean labor day".              Home Living Family/patient expects to be discharged to:: Private residence (senior living apartments) Living Arrangements: Alone Available Help at Discharge: Family;Available PRN/intermittently Type of Home: House Home Access: Level entry     Home Layout: One level     Bathroom Shower/Tub: Chief Strategy Officer: Standard Bathroom  Accessibility: Yes   Home Equipment: Cane - single point          Prior Functioning/Environment Level of Independence: Independent        Comments: Pt lives on her own and performs all aspects of self care without assistance, cooks, cleans, and does her own laundry without assistance. Very active.        OT Problem List: Decreased strength;Decreased activity tolerance;Decreased safety awareness;Impaired balance (sitting and/or standing);Decreased knowledge of use of DME or AE      OT Treatment/Interventions: Self-care/ADL training;Therapeutic exercise;Therapeutic activities;Energy conservation;DME and/or AE instruction;Patient/family education;Balance training    OT Goals(Current goals can be found in the care plan section) Acute Rehab OT Goals Patient Stated Goal: to get stronger and go home OT Goal Formulation: With patient/family Time For Goal Achievement: 12/19/19 Potential to Achieve Goals: Good ADL Goals Pt Will Perform Grooming: with modified independence;standing Pt Will Transfer to Toilet: with modified independence;ambulating Pt Will Perform Toileting - Clothing Manipulation and hygiene: with modified independence;sit to/from stand Pt Will Perform Tub/Shower Transfer: Tub transfer;with supervision  OT Frequency: Min 1X/week   Barriers to D/C:    none known at this time          AM-PAC OT "6 Clicks" Daily Activity     Outcome Measure Help from another person eating meals?: None Help from another person taking care of personal grooming?: A Little Help from another person toileting, which includes using toliet, bedpan, or urinal?: A Little Help from another person bathing (including washing, rinsing, drying)?: A Little Help from another person to put on and taking off regular upper body clothing?: None Help from another person to put on and taking off regular lower body clothing?: A Little 6 Click Score: 20   End of Session Nurse Communication: Mobility  status  Activity Tolerance: Patient tolerated treatment well Patient left: in bed;with call bell/phone within reach;with bed alarm set;with family/visitor present  OT Visit Diagnosis: Unsteadiness on feet (R26.81);Muscle weakness (generalized) (M62.81)                Time: 6789-3810 OT Time Calculation (min): 24 min Charges:  OT General Charges $OT Visit: 1 Visit OT Evaluation $OT Eval Low Complexity: 1 Low OT Treatments $Self Care/Home Management : 8-22 mins   Jackquline Denmark, MS, OTR/L , CBIS ascom [phone]  12/05/19, 11:59 AM  12/05/2019, 11:58 AM

## 2019-12-06 DIAGNOSIS — A419 Sepsis, unspecified organism: Secondary | ICD-10-CM | POA: Diagnosis not present

## 2019-12-06 DIAGNOSIS — R6520 Severe sepsis without septic shock: Secondary | ICD-10-CM | POA: Diagnosis not present

## 2019-12-06 DIAGNOSIS — J9601 Acute respiratory failure with hypoxia: Secondary | ICD-10-CM | POA: Diagnosis not present

## 2019-12-06 DIAGNOSIS — N10 Acute tubulo-interstitial nephritis: Secondary | ICD-10-CM | POA: Diagnosis not present

## 2019-12-06 LAB — URINE CULTURE

## 2019-12-06 LAB — ECHOCARDIOGRAM COMPLETE
AR max vel: 2.49 cm2
AV Area VTI: 2.6 cm2
AV Area mean vel: 2.39 cm2
AV Mean grad: 9.3 mmHg
AV Peak grad: 15.9 mmHg
Ao pk vel: 2 m/s
Area-P 1/2: 2.05 cm2
Height: 66 in
P 1/2 time: 505 msec
S' Lateral: 2.32 cm
Weight: 1931.2 oz

## 2019-12-06 MED ORDER — CEFDINIR 300 MG PO CAPS: 300.0000 mg | ORAL_CAPSULE | Freq: Two times a day (BID) | ORAL | 0 refills | Status: AC

## 2019-12-06 NOTE — Progress Notes (Signed)
Discussed discharge instructions with patient and daughter including medications and follow up appt.  Sent home instructions

## 2019-12-06 NOTE — Discharge Summary (Signed)
Physician Discharge Summary  Patient ID: Samantha Saunders MRN: [id number] DOB/AGE: 01/29/1926 86 y.o.  Admit date: 12/04/2019 Discharge date: 12/06/2019  Admission Diagnoses:  Discharge Diagnoses:  Principal Problem:   UTI (urinary tract infection) Active Problems:   Hypertension   Hypokalemia   Acute respiratory failure with hypoxia (HCC)   Severe sepsis (HCC)   HLD (hyperlipidemia)   Acute pyelonephritis   Discharged Condition: good  Hospital Course:  [name] a 87 y.o.femalewith medical history significant ofhypertension, hyperlipidemia, syncope, left bundle blockade, who presents with burning on urination, increased urinary frequency.  Patient was also complaining of right flank pain and generalized weakness.  No fever or chills. Upon arriving the emergency room, she had a transient hypoxia.  Oxygen saturation was 78% on room air, she was placed on 3 L oxygen.  CT angiogram of chest negative for PE, BNP was normal.   #1.  Severe sepsis secondary to acute pyelonephritis. POA. Acute pyelonephritis. Patient met sepsis criteria at time of admission with tachycardia, tachypnea, elevated lactic acid and acute hypoxemia. She also has urinary symptoms, in addition to right flank pain.  She has right CVA tenderness.  CT scan did not show any obstruction.  She has acute pyelonephritis at admission. Blood cultures are negative, urine culture has mixed flora. She has completed 2 days of Rocephin, will continue to complete 10 days antibiotics with cefdinir.    2.  Hypokalemia. Potassium normalized.  3.  Acute hypoxemic respiratory failure. Patient had a transient hypoxemia with ambulation.  Currently off oxygen.  No evidence of volume overload, no evidence of PE.  No evidence of COPD.  Assume this is from sepsis.  Condition has resolved.  Consults: None  Significant Diagnostic Studies:  CT ANGIOGRAPHY CHEST WITH CONTRAST  TECHNIQUE: Multidetector CT imaging of the  chest was performed using the standard protocol during bolus administration of intravenous contrast. Multiplanar CT image reconstructions and MIPs were obtained to evaluate the vascular anatomy.  CONTRAST:  33m OMNIPAQUE IOHEXOL 350 MG/ML SOLN  COMPARISON:  Chest radiography same day  FINDINGS: Cardiovascular: Heart size is normal. There is a small amount of pericardial fluid. Coronary artery calcification is present. Aortic atherosclerotic calcification is present. Pulmonary arterial opacification is good. There are no pulmonary emboli.  Mediastinum/Nodes: No mediastinal or hilar mass or lymphadenopathy.  Lungs/Pleura: No emphysema. Minimal pleural and parenchymal scarring at the apices. No infiltrate, mass, effusion or collapse.  Upper Abdomen: Nonobstructing bilateral renal calculi.  Musculoskeletal: Minimal thoracic degenerative changes. No acute finding.  Review of the MIP images confirms the above findings.  IMPRESSION: 1. No pulmonary emboli or other acute chest pathology. 2. Aortic atherosclerosis. Coronary artery calcification. Small amount of pericardial fluid. 3. Nonobstructing bilateral renal calculi.  Aortic Atherosclerosis (ICD10-I70.0).   Electronically Signed   By: MNelson ChimesM.D.   On: 12/04/2019 15:46  Echo: Left ventricular ejection fraction, by estimation, is 60 to 65%. The left ventricle has normal function. The left ventricle has no regional wall motion abnormalities. Left ventricular diastolic parameters were normal. 2. Right ventricular systolic function is normal. The right ventricular size is normal. 3. The mitral valve is normal in structure. Mild mitral valve regurgitation. 4. The aortic valve is normal in structure. Aortic valve regurgitation is mild. Mild to moderate aortic valve sclerosis/calcification is present, without any evidence of aortic stenosis.   Treatments: Rocephin  Discharge Exam: Blood pressure (!)  143/71, pulse 75, temperature (!) 97.4 F (36.3 C), temperature source Oral, resp. rate 18, height  5' 6"  (1.676 m), weight 55.1 kg, SpO2 100 %. General appearance: alert and cooperative Resp: clear to auscultation bilaterally Cardio: regular rate and rhythm, S1, S2 normal, no murmur, click, rub or gallop GI: soft, non-tender; bowel sounds normal; no masses,  no organomegaly Extremities: extremities normal, atraumatic, no cyanosis or edema  Disposition: Discharge disposition: 01-Home or Self Care       Discharge Instructions    Diet - low sodium heart healthy   Complete by: As directed    Increase activity slowly   Complete by: As directed      Allergies as of 12/06/2019      Reactions   Statins Hives      Medication List    STOP taking these medications   nitrofurantoin (macrocrystal-monohydrate) 100 MG capsule Commonly known as: MACROBID     TAKE these medications   amLODipine 10 MG tablet Commonly known as: NORVASC Take 1 tablet (10 mg total) by mouth daily.   aspirin 81 MG tablet Take 81 mg by mouth daily.   cefdinir 300 MG capsule Commonly known as: OMNICEF Take 1 capsule (300 mg total) by mouth 2 (two) times daily for 8 days.   ezetimibe 10 MG tablet Commonly known as: ZETIA Take 10 mg by mouth daily.       Follow-up Information    Samantha Pac, FNP Follow up in 1 week(s).   Specialty: Family Medicine Contact information: [address] [place] [address] [phone]               Signed: Sharen Hones 12/06/2019, 10:33 AM

## 2019-12-09 LAB — CULTURE, BLOOD (ROUTINE X 2)
Culture: NO GROWTH
Culture: NO GROWTH
Special Requests: ADEQUATE

## 2019-12-14 ENCOUNTER — Ambulatory Visit: Payer: Medicare Other | Admitting: Obstetrics & Gynecology

## 2020-01-06 ENCOUNTER — Other Ambulatory Visit: Payer: Self-pay

## 2020-01-06 ENCOUNTER — Encounter: Payer: Self-pay | Admitting: Obstetrics & Gynecology

## 2020-01-06 ENCOUNTER — Ambulatory Visit (INDEPENDENT_AMBULATORY_CARE_PROVIDER_SITE_OTHER): Payer: Medicare Other | Admitting: Obstetrics & Gynecology

## 2020-01-06 VITALS — BP 120/70 | Ht 64.0 in | Wt 120.0 lb

## 2020-01-06 DIAGNOSIS — N816 Rectocele: Secondary | ICD-10-CM

## 2020-01-06 DIAGNOSIS — N993 Prolapse of vaginal vault after hysterectomy: Secondary | ICD-10-CM | POA: Diagnosis not present

## 2020-01-06 NOTE — Progress Notes (Signed)
  HPI:      [name] is a 86 y.o. [phone] who presents today for her pessary follow up and examination related to her pelvic floor weakening.  Pt reports tolerating the pessary well with no vaginal bleeding and no vaginal discharge.  Symptoms of pelvic floor weakening have greatly improved. She is voiding and defecating without difficulty. She currently has a Holiday representative w Support #2 pessary.  PMHx: She  has a past medical history of Heart murmur, History of kidney infection, Hyperlipidemia, Hypertension, LBBB (left bundle branch block), and Syncope and collapse. Also,  has a past surgical history that includes Abdominal hysterectomy., family history includes Hypertension in her mother; Stroke in her mother.,  reports that she has never smoked. She has never used smokeless tobacco. She reports that she does not drink alcohol and does not use drugs.  She has a current medication list which includes the following prescription(s): amlodipine, aspirin, and ezetimibe. Also, is allergic to statins.  Review of Systems  All other systems reviewed and are negative.   Objective: BP 120/70   Ht 5\' 4"  (1.626 m)   Wt 120 lb (54.4 kg)   BMI 20.60 kg/m  Physical Exam Constitutional:      General: She is not in acute distress.    Appearance: She is well-developed.  Genitourinary:     Vagina normal.     Genitourinary Comments: Cuff intact/ no lesions  Absent uterus and cervix     No vaginal erythema or bleeding.     Pelvic exam was performed with patient supine.  HENT:     Head: Normocephalic and atraumatic.     Nose: Nose normal.  Abdominal:     General: There is no distension.     Palpations: Abdomen is soft.     Tenderness: There is no abdominal tenderness.  Musculoskeletal:        General: Normal range of motion.  Neurological:     Mental Status: She is alert and oriented to person, place, and time.     Cranial Nerves: No cranial nerve deficit.  Skin:    General: Skin is warm and dry.   Psychiatric:        Attention and Perception: Attention normal.        Mood and Affect: Mood normal.        Speech: Speech normal.        Behavior: Behavior normal.        Cognition and Memory: Cognition normal.        Judgment: Judgment normal.     Pessary Care Pessary removed and cleaned.  Vagina checked - without erosions - pessary replaced.  A/P:   ICD-10-CM   1. 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 Rectocele  N81.6    Pessary was cleaned and replaced today. Instructions given for care. Concerning symptoms to observe for are counseled to patient. Follow up scheduled for 3 months.  A total of 20 minutes were spent face-to-face with the patient as well as preparation, review, communication, and documentation during this encounter.   , MD, Annam[facility] Major Ob/G[facility], [facility] 01/06/2020  1:27 PM

## 2020-02-17 NOTE — Telephone Encounter (Signed)
Pessary rcvd/charged 12/27/17

## 2020-04-05 ENCOUNTER — Ambulatory Visit (INDEPENDENT_AMBULATORY_CARE_PROVIDER_SITE_OTHER): Payer: Medicare Other | Admitting: Obstetrics & Gynecology

## 2020-04-05 ENCOUNTER — Encounter: Payer: Self-pay | Admitting: Obstetrics & Gynecology

## 2020-04-05 ENCOUNTER — Other Ambulatory Visit: Payer: Self-pay

## 2020-04-05 VITALS — BP 112/60 | Ht 66.0 in | Wt 121.0 lb

## 2020-04-05 DIAGNOSIS — N816 Rectocele: Secondary | ICD-10-CM | POA: Diagnosis not present

## 2020-04-05 DIAGNOSIS — N993 Prolapse of vaginal vault after hysterectomy: Secondary | ICD-10-CM | POA: Diagnosis not present

## 2020-04-05 NOTE — Progress Notes (Signed)
  HPI:      [name] is a 87 y.o. [phone] who presents today for her pessary follow up and examination related to her pelvic floor weakening.  Pt reports tolerating the pessary well with no vaginal bleeding and no vaginal discharge.  Symptoms of pelvic floor weakening have greatly improved. She is voiding and defecating without difficulty. She currently has a Ring #2 pessary.  PMHx: She  has a past medical history of Heart murmur, History of kidney infection, Hyperlipidemia, Hypertension, LBBB (left bundle branch block), and Syncope and collapse. Also,  has a past surgical history that includes Abdominal hysterectomy., family history includes Hypertension in her mother; Stroke in her mother.,  reports that she has never smoked. She has never used smokeless tobacco. She reports that she does not drink alcohol and does not use drugs.  She has a current medication list which includes the following prescription(s): amlodipine, aspirin, and ezetimibe. Also, is allergic to statins.  Review of Systems  All other systems reviewed and are negative.   Objective: BP 112/60   Ht 5\' 6"  (1.676 m)   Wt 121 lb (54.9 kg)   BMI 19.53 kg/m  Physical Exam Constitutional:      General: She is not in acute distress.    Appearance: She is well-developed.  Genitourinary:     Genitourinary Comments: Cuff intact/ no lesions  Absent uterus and cervix     No vaginal erythema or bleeding.  HENT:     Head: Normocephalic and atraumatic.     Nose: Nose normal.  Abdominal:     General: There is no distension.     Palpations: Abdomen is soft.     Tenderness: There is no abdominal tenderness.  Musculoskeletal:        General: Normal range of motion.  Neurological:     Mental Status: She is alert and oriented to person, place, and time.     Cranial Nerves: No cranial nerve deficit.  Skin:    General: Skin is warm and dry.  Psychiatric:        Attention and Perception: Attention normal.        Mood  and Affect: Mood normal.        Speech: Speech normal.        Behavior: Behavior normal.        Cognition and Memory: Cognition normal.        Judgment: Judgment normal.     Pessary Care Pessary removed and cleaned.  Vagina checked - without erosions - pessary replaced.  A/P:   ICD-10-CM   1. 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 Rectocele  N81.6    Pessary was cleaned and replaced today. Instructions given for care. Concerning symptoms to observe for are counseled to patient. Follow up scheduled for 3 months.  A total of 20 minutes were spent face-to-face with the patient as well as preparation, review, communication, and documentation during this encounter.   , MD, Annamarie Major Ob/Gyn, [facility] 04/05/2020  3:21 PM

## 2020-05-09 ENCOUNTER — Other Ambulatory Visit: Payer: Self-pay

## 2020-05-09 ENCOUNTER — Encounter: Payer: Self-pay | Admitting: Obstetrics and Gynecology

## 2020-05-09 ENCOUNTER — Ambulatory Visit (INDEPENDENT_AMBULATORY_CARE_PROVIDER_SITE_OTHER): Payer: Medicare Other | Admitting: Obstetrics and Gynecology

## 2020-05-09 VITALS — BP 122/74 | Ht 65.0 in

## 2020-05-09 DIAGNOSIS — N816 Rectocele: Secondary | ICD-10-CM | POA: Diagnosis not present

## 2020-05-09 DIAGNOSIS — N993 Prolapse of vaginal vault after hysterectomy: Secondary | ICD-10-CM

## 2020-05-09 DIAGNOSIS — T839XXA Unspecified complication of genitourinary prosthetic device, implant and graft, initial encounter: Secondary | ICD-10-CM | POA: Diagnosis not present

## 2020-05-09 NOTE — Progress Notes (Signed)
HPI:      [name] is a 87 y.o. [phone] who presents today for her pessary follow up and examination related to her pelvic floor weakening.  Pt reports tolerating the pessary well with scant vaginal bleeding for the past two weeks. She has had mostly dark spotting and no vaginal discharge.  Symptoms of pelvic floor weakening have greatly improved. She is voiding and defecating without difficulty. She currently has a Ring #2 pessary.  PMHx: She  has a past medical history of Heart murmur, History of kidney infection, Hyperlipidemia, Hypertension, LBBB (left bundle branch block), and Syncope and collapse. Also,  has a past surgical history that includes Abdominal hysterectomy., family history includes Hypertension in her mother; Stroke in her mother.,  reports that she has never smoked. She has never used smokeless tobacco. She reports that she does not drink alcohol and does not use drugs.  She has a current medication list which includes the following prescription(s): amlodipine, aspirin, and ezetimibe. Also, is allergic to statins.  Review of Systems  Constitutional: Negative.   HENT: Negative.   Eyes: Negative.   Respiratory: Negative.   Cardiovascular: Negative.   Gastrointestinal: Negative.   Genitourinary: Negative.        Vaginal spotting as per HPI  Musculoskeletal: Negative.   Skin: Negative.   Neurological: Negative.   Psychiatric/Behavioral: Negative.     Objective: BP 122/74   Ht 5\' 5"  (1.651 m)   BMI 20.14 kg/m  Physical Exam Constitutional:      General: She is not in acute distress.    Appearance: Normal appearance.  Genitourinary:     Bladder and urethral meatus normal.     No lesions in the vagina.     Right Labia: No rash, tenderness, lesions or skin changes.    Left Labia: No tenderness, lesions, skin changes or rash.    No inguinal adenopathy present in the right or left side.    Pelvic Tanner Score: 5/5.    Vaginal bleeding present.     No  vaginal discharge, erythema or ulceration.     No vaginal prolapse present.     Right Adnexa: not tender, not full and no mass present.    Left Adnexa: not tender, not full and no mass present.    Cervix is absent.     Uterus is absent.     Discharge: mild erosions.  HENT:     Head: Normocephalic and atraumatic.  Eyes:     General: No scleral icterus.    Conjunctiva/sclera: Conjunctivae normal.  Lymphadenopathy:     Lower Body: No right inguinal adenopathy. No left inguinal adenopathy.  Neurological:     General: No focal deficit present.     Mental Status: She is alert and oriented to person, place, and time.     Cranial Nerves: No cranial nerve deficit.  Psychiatric:        Mood and Affect: Mood normal.        Behavior: Behavior normal.        Judgment: Judgment normal.     Pessary Care Pessary removed and cleaned.  Vagina checked - with mild erosion  A/P:  Instructions given for care. Concerning symptoms to observe for are counseled to patient. Follow up scheduled for 2 weeks  Pessary holiday advised.  Will leave out and follow symptoms closely.    Will start with topical estrogen twice per week.   A total of 20 minutes were spent face-to-face with the patient  as well as preparation, review, communication, and documentation during this encounter.   Thomasene Mohair, MD  [facility], [facility] 05/09/2020  11:15 AM

## 2020-05-09 NOTE — Patient Instructions (Signed)
Every Sunday and Wednesday: Use 0.5 grams of Premarin.   Only use TWICE EACH week.  We will see you again in 2-3 weeks to see about putting the pessary back in.

## 2020-05-24 ENCOUNTER — Other Ambulatory Visit: Payer: Self-pay

## 2020-05-24 ENCOUNTER — Encounter: Payer: Self-pay | Admitting: Obstetrics and Gynecology

## 2020-05-24 ENCOUNTER — Ambulatory Visit (INDEPENDENT_AMBULATORY_CARE_PROVIDER_SITE_OTHER): Payer: Medicare Other | Admitting: Obstetrics and Gynecology

## 2020-05-24 VITALS — BP 122/74

## 2020-05-24 DIAGNOSIS — T839XXD Unspecified complication of genitourinary prosthetic device, implant and graft, subsequent encounter: Secondary | ICD-10-CM | POA: Diagnosis not present

## 2020-05-24 DIAGNOSIS — N816 Rectocele: Secondary | ICD-10-CM

## 2020-05-24 DIAGNOSIS — N993 Prolapse of vaginal vault after hysterectomy: Secondary | ICD-10-CM

## 2020-05-24 NOTE — Progress Notes (Signed)
HPI:      [name] is a 87 y.o. [phone] who presents today for her pessary follow up and examination related to her pelvic floor weakening.  Patient had pessary removed at last visit in case of erosion.  She follows up today for reassessment.  With her pessary out she has noted a little more dryness in her vagina.  She notes no bleeding or other concerning symptoms. She would like to have the pessary replaced.  She currently has a Ring #2 pessary.  PMHx: She  has a past medical history of Heart murmur, History of kidney infection, Hyperlipidemia, Hypertension, LBBB (left bundle branch block), and Syncope and collapse. Also,  has a past surgical history that includes Abdominal hysterectomy., family history includes Hypertension in her mother; Stroke in her mother.,  reports that she has never smoked. She has never used smokeless tobacco. She reports that she does not drink alcohol and does not use drugs.  She has a current medication list which includes the following prescription(s): amlodipine, aspirin, and ezetimibe. Also, is allergic to statins.  Review of Systems  Constitutional: Negative.   HENT: Negative.   Eyes: Negative.   Respiratory: Negative.   Cardiovascular: Negative.   Gastrointestinal: Negative.   Genitourinary: Negative.        Vaginal spotting as per HPI  Musculoskeletal: Negative.   Skin: Negative.   Neurological: Negative.   Psychiatric/Behavioral: Negative.     Objective: BP 122/74  Physical Exam Constitutional:      General: She is not in acute distress.    Appearance: Normal appearance.  Genitourinary:     Bladder and urethral meatus normal.     No lesions in the vagina.     Right Labia: No rash, tenderness, lesions or skin changes.    Left Labia: No tenderness, lesions, skin changes or rash.    Pelvic Tanner Score: 5/5.    Vaginal bleeding present.     No vaginal discharge, erythema or ulceration.     No vaginal prolapse present.    Vaginal exam  comments: No erosions or ulcerations.      Right Adnexa: not tender, not full and no mass present.    Left Adnexa: not tender, not full and no mass present.    Cervix is absent.     Uterus is absent.     Urethral discharge present.  HENT:     Head: Normocephalic and atraumatic.  Eyes:     General: No scleral icterus.    Conjunctiva/sclera: Conjunctivae normal.  Lymphadenopathy:     Lower Body: No right inguinal adenopathy. No left inguinal adenopathy.  Neurological:     General: No focal deficit present.     Mental Status: She is alert and oriented to person, place, and time.     Cranial Nerves: No cranial nerve deficit.  Psychiatric:        Mood and Affect: Mood normal.        Behavior: Behavior normal.        Judgment: Judgment normal.     Pessary Care Pessary cleaned and placed.  Vagina checked - mild erosions resolved.   A/P:  Instructions given for care. Concerning symptoms to observe for are counseled to patient. Follow up scheduled for 3 months   Continue topical estrogen twice per week.   A total of 23 minutes were spent face-to-face with the patient as well as preparation, review, communication, and documentation during this encounter.   Thomasene Mohair, MD  [facility]  Ob/Gyn, [facility] 05/24/2020  3:19 PM

## 2020-06-06 ENCOUNTER — Inpatient Hospital Stay
Admission: EM | Admit: 2020-06-06 | Discharge: 2020-06-08 | DRG: 690 | Disposition: A | Discharge status: Home / Self Care | Payer: Medicare Other | Attending: Internal Medicine | Admitting: Internal Medicine

## 2020-06-06 ENCOUNTER — Other Ambulatory Visit: Payer: Self-pay

## 2020-06-06 ENCOUNTER — Encounter: Payer: Self-pay | Admitting: Emergency Medicine

## 2020-06-06 ENCOUNTER — Emergency Department: Payer: Medicare Other

## 2020-06-06 DIAGNOSIS — N12 Tubulo-interstitial nephritis, not specified as acute or chronic: Secondary | ICD-10-CM | POA: Diagnosis present

## 2020-06-06 DIAGNOSIS — N200 Calculus of kidney: Secondary | ICD-10-CM | POA: Diagnosis present

## 2020-06-06 DIAGNOSIS — Z8249 Family history of ischemic heart disease and other diseases of the circulatory system: Secondary | ICD-10-CM

## 2020-06-06 DIAGNOSIS — E785 Hyperlipidemia, unspecified: Secondary | ICD-10-CM | POA: Diagnosis not present

## 2020-06-06 DIAGNOSIS — F0390 Unspecified dementia without behavioral disturbance: Secondary | ICD-10-CM

## 2020-06-06 DIAGNOSIS — Z87440 Personal history of urinary (tract) infections: Secondary | ICD-10-CM

## 2020-06-06 DIAGNOSIS — N10 Acute tubulo-interstitial nephritis: Secondary | ICD-10-CM | POA: Diagnosis not present

## 2020-06-06 DIAGNOSIS — Z90710 Acquired absence of both cervix and uterus: Secondary | ICD-10-CM

## 2020-06-06 DIAGNOSIS — N390 Urinary tract infection, site not specified: Secondary | ICD-10-CM

## 2020-06-06 DIAGNOSIS — I447 Left bundle-branch block, unspecified: Secondary | ICD-10-CM | POA: Diagnosis present

## 2020-06-06 DIAGNOSIS — N3001 Acute cystitis with hematuria: Secondary | ICD-10-CM | POA: Diagnosis present

## 2020-06-06 DIAGNOSIS — I10 Essential (primary) hypertension: Secondary | ICD-10-CM | POA: Diagnosis not present

## 2020-06-06 DIAGNOSIS — Z7982 Long term (current) use of aspirin: Secondary | ICD-10-CM

## 2020-06-06 DIAGNOSIS — Z79899 Other long term (current) drug therapy: Secondary | ICD-10-CM

## 2020-06-06 DIAGNOSIS — M5136 Other intervertebral disc degeneration, lumbar region: Secondary | ICD-10-CM | POA: Diagnosis present

## 2020-06-06 DIAGNOSIS — Z66 Do not resuscitate: Secondary | ICD-10-CM | POA: Diagnosis present

## 2020-06-06 DIAGNOSIS — M545 Low back pain, unspecified: Secondary | ICD-10-CM

## 2020-06-06 DIAGNOSIS — Z20822 Contact with and (suspected) exposure to covid-19: Secondary | ICD-10-CM | POA: Diagnosis present

## 2020-06-06 LAB — COMPREHENSIVE METABOLIC PANEL
ALT: 16 U/L (ref 0–44)
AST: 27 U/L (ref 15–41)
Albumin: 4.4 g/dL (ref 3.5–5.0)
Alkaline Phosphatase: 45 U/L (ref 38–126)
Anion gap: 10 (ref 5–15)
BUN: 12 mg/dL (ref 8–23)
CO2: 25 mmol/L (ref 22–32)
Calcium: 9.7 mg/dL (ref 8.9–10.3)
Chloride: 100 mmol/L (ref 98–111)
Creatinine, Ser: 0.95 mg/dL (ref 0.44–1.00)
GFR, Estimated: 57 mL/min — ABNORMAL LOW (ref >60)
Glucose, Bld: 107 mg/dL — ABNORMAL HIGH (ref 70–99)
Potassium: 3.7 mmol/L (ref 3.5–5.1)
Sodium: 135 mmol/L (ref 135–145)
Total Bilirubin: 0.9 mg/dL (ref 0.3–1.2)
Total Protein: 8.3 g/dL — ABNORMAL HIGH (ref 6.5–8.1)

## 2020-06-06 LAB — CBC WITH DIFFERENTIAL/PLATELET
Abs Immature Granulocytes: 0.01 10*3/uL (ref 0.00–0.07)
Basophils Absolute: 0 10*3/uL (ref 0.0–0.1)
Basophils Relative: 1 %
Eosinophils Absolute: 0 10*3/uL (ref 0.0–0.5)
Eosinophils Relative: 0 %
HCT: 38 % (ref 36.0–46.0)
Hemoglobin: 12.9 g/dL (ref 12.0–15.0)
Immature Granulocytes: 0 %
Lymphocytes Relative: 23 %
Lymphs Abs: 1.5 10*3/uL (ref 0.7–4.0)
MCH: 29.5 pg (ref 26.0–34.0)
MCHC: 33.9 g/dL (ref 30.0–36.0)
MCV: 87 fL (ref 80.0–100.0)
Monocytes Absolute: 0.6 10*3/uL (ref 0.1–1.0)
Monocytes Relative: 10 %
Neutro Abs: 4.4 10*3/uL (ref 1.7–7.7)
Neutrophils Relative %: 66 %
Platelets: 269 10*3/uL (ref 150–400)
RBC: 4.37 MIL/uL (ref 3.87–5.11)
RDW: 13.3 % (ref 11.5–15.5)
WBC: 6.5 10*3/uL (ref 4.0–10.5)
nRBC: 0 % (ref 0.0–0.2)

## 2020-06-06 LAB — URINALYSIS, COMPLETE (UACMP) WITH MICROSCOPIC
Bilirubin Urine: NEGATIVE
Glucose, UA: NEGATIVE mg/dL
Ketones, ur: NEGATIVE mg/dL
Nitrite: NEGATIVE
Protein, ur: NEGATIVE mg/dL
RBC / HPF: >50 RBC/hpf — ABNORMAL HIGH (ref 0–5)
Specific Gravity, Urine: 1.012 (ref 1.005–1.030)
pH: 6 (ref 5.0–8.0)

## 2020-06-06 LAB — RESP PANEL BY RT-PCR (FLU A&B, COVID) ARPGX2
Influenza A by PCR: NEGATIVE
Influenza B by PCR: NEGATIVE
SARS Coronavirus 2 by RT PCR: NEGATIVE

## 2020-06-06 MED ORDER — ASPIRIN 81 MG PO CHEW
81.0000 mg | CHEWABLE_TABLET | Freq: Every day | ORAL | Status: DC
  Administered 2020-06-07 – 2020-06-08 (×2): 81 mg via ORAL
  Filled 2020-06-06 (×2): qty 1

## 2020-06-06 MED ORDER — ACETAMINOPHEN 500 MG PO TABS
1000.0000 mg | ORAL_TABLET | Freq: Once | ORAL | Status: AC
  Administered 2020-06-06: 1000 mg via ORAL
  Filled 2020-06-06: qty 2

## 2020-06-06 MED ORDER — ONDANSETRON HCL 4 MG/2ML IJ SOLN
4.0000 mg | Freq: Three times a day (TID) | INTRAMUSCULAR | Status: DC | PRN
Start: 2020-06-06 — End: 2020-06-08

## 2020-06-06 MED ORDER — ACETAMINOPHEN 325 MG PO TABS
650.0000 mg | ORAL_TABLET | Freq: Four times a day (QID) | ORAL | Status: DC | PRN
  Filled 2020-06-06: qty 2

## 2020-06-06 MED ORDER — ENOXAPARIN SODIUM 40 MG/0.4ML IJ SOSY
40.0000 mg | PREFILLED_SYRINGE | INTRAMUSCULAR | Status: DC
  Administered 2020-06-06 – 2020-06-07 (×2): 40 mg via SUBCUTANEOUS
  Filled 2020-06-06 (×2): qty 0.4

## 2020-06-06 MED ORDER — HYDRALAZINE HCL 20 MG/ML IJ SOLN: 5.0000 mg | INTRAMUSCULAR | Status: DC | PRN

## 2020-06-06 MED ORDER — SODIUM CHLORIDE 0.9 % IV SOLN
1.0000 g | INTRAVENOUS | Status: DC
  Administered 2020-06-07: 1 g via INTRAVENOUS
  Filled 2020-06-06: qty 1
  Filled 2020-06-06: qty 10

## 2020-06-06 MED ORDER — AMLODIPINE BESYLATE 10 MG PO TABS
10.0000 mg | ORAL_TABLET | Freq: Every day | ORAL | Status: DC
  Administered 2020-06-07 – 2020-06-08 (×2): 10 mg via ORAL
  Filled 2020-06-06 (×2): qty 1

## 2020-06-06 MED ORDER — LIDOCAINE 5 % EX PTCH
1.0000 | MEDICATED_PATCH | Freq: Once | CUTANEOUS | Status: AC
  Administered 2020-06-06: 1 via TRANSDERMAL
  Filled 2020-06-06: qty 1

## 2020-06-06 MED ORDER — EZETIMIBE 10 MG PO TABS
10.0000 mg | ORAL_TABLET | Freq: Every day | ORAL | Status: DC
  Administered 2020-06-07 – 2020-06-08 (×2): 10 mg via ORAL
  Filled 2020-06-06 (×2): qty 1

## 2020-06-06 MED ORDER — SODIUM CHLORIDE 0.9 % IV SOLN
1.0000 g | Freq: Once | INTRAVENOUS | Status: AC
  Administered 2020-06-06: 1 g via INTRAVENOUS
  Filled 2020-06-06: qty 10

## 2020-06-06 NOTE — ED Provider Notes (Signed)
[facility] Emergency Department Provider Note ____________________________________________   Event Date/Time   First MD Initiated Contact with Patient 06/06/20 1319     (approximate)  I have reviewed the triage vital signs and the nursing notes.  HISTORY  Chief Complaint Back Pain   HPI Samantha Saunders is a 87 y.o. femalewho presents to the ED for evaluation of back pain.  Chart review indicates history of HTN, HLD, syncope and LBBB.  Patient presents to the ED for evaluation of urinary frequency and right-sided atraumatic lumbar pain.  She reports concern for a kidney infection, due to her history of the same, with symptoms reminiscent of this.  Reports increased urinary frequency without dysuria or hematuria.  Denies fevers or chills.  Denies nausea or emesis, stool changes.   Past Medical History:  Diagnosis Date  . Heart murmur   . History of kidney infection   . Hyperlipidemia   . Hypertension   . LBBB (left bundle branch block)   . Syncope and collapse     Patient Active Problem List   Diagnosis Date Noted  . Acute pyelonephritis 12/05/2019  . UTI (urinary tract infection) 12/04/2019  . Acute respiratory failure with hypoxia (HCC) 12/04/2019  . Severe sepsis (HCC) 12/04/2019  . HLD (hyperlipidemia) 12/04/2019  . Hypokalemia 07/08/2018  . Rectocele 10/19/2016  . 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 SOB (shortness of breath) 10/23/2013  . Pre-syncope 04/23/2013  . Hypertension     Past Surgical History:  Procedure Laterality Date  . ABDOMINAL HYSTERECTOMY      Prior to Admission medications   Medication Sig Start Date End Date Taking? Authorizing Provider  amLODipine (NORVASC) 10 MG tablet Take 1 tablet (10 mg total) by mouth daily. 04/21/13   Iran Ouch, MD  aspirin 81 MG tablet Take 81 mg by mouth daily.    [provider]  ezetimibe (ZETIA) 10 MG tablet Take 10 mg by mouth daily.    [provider]    Allergies Statins  Family History  Problem Relation Age of Onset  . Stroke Mother   . Hypertension Mother     Social History Social History   Tobacco Use  . Smoking status: Never Smoker  . Smokeless tobacco: Never Used  Vaping Use  . Vaping Use: Never used  Substance Use Topics  . Alcohol use: No  . Drug use: No    Review of Systems  Constitutional: No fever/chills Eyes: No visual changes. ENT: No sore throat. Cardiovascular: Denies chest pain. Respiratory: Denies shortness of breath. Gastrointestinal: No abdominal pain.  No nausea, no vomiting.  No diarrhea.  No constipation. Genitourinary: Negative for dysuria.  Positive for increased urinary frequency Musculoskeletal: Positive for atraumatic right-sided lumbar and flank pain. Skin: Negative for rash. Neurological: Negative for headaches, focal weakness or numbness.  ____________________________________________   PHYSICAL EXAM:  VITAL SIGNS: Vitals:   06/06/20 1213  BP: (!) 135/57  Pulse: 79  Resp: 16  Temp: 98.3 F (36.8 C)  SpO2: 99%     Constitutional: Alert and oriented. Well appearing and in no acute distress.  Pleasant and conversational in full sentences. Eyes: Conjunctivae are normal. PERRL. EOMI. Head: Atraumatic. Nose: No congestion/rhinnorhea. Mouth/Throat: Mucous membranes are moist.  Oropharynx non-erythematous. Neck: No stridor. No cervical spine tenderness to palpation. Cardiovascular: Normal rate, regular rhythm. Grossly normal heart sounds.  Good peripheral circulation. Respiratory: Normal respiratory effort.  No retractions. Lungs CTAB. Gastrointestinal: Soft , nondistended, nontender to  palpation.  Right sided CVA tenderness is present.  Benign frontal abdomen. Musculoskeletal: No lower extremity tenderness nor edema.  No joint effusions. No signs of acute trauma. Neurologic:  Normal speech and language. No gross focal neurologic deficits are appreciated. Skin:   Skin is warm, dry and intact. No rash noted. Psychiatric: Mood and affect are normal. Speech and behavior are normal.  ____________________________________________   LABS (all labs ordered are listed, but only abnormal results are displayed)  Labs Reviewed  URINALYSIS, COMPLETE (UACMP) WITH MICROSCOPIC - Abnormal; Notable for the following components:      Result Value   Color, Urine YELLOW (*)    APPearance HAZY (*)    Hgb urine dipstick LARGE (*)    Leukocytes,Ua LARGE (*)    RBC / HPF >50 (*)    Bacteria, UA RARE (*)    All other components within normal limits  COMPREHENSIVE METABOLIC PANEL - Abnormal; Notable for the following components:   Glucose, Bld 107 (*)    Total Protein 8.3 (*)    GFR, Estimated 57 (*)    All other components within normal limits  URINE CULTURE  RESP PANEL BY RT-PCR (FLU A&B, COVID) ARPGX2  CBC WITH DIFFERENTIAL/PLATELET   ____________________________________________  12 Lead EKG  Sinus rhythm, rate of 88 bpm.  Normal axis and borderline long QTC of 484 ms.  LBBB morphology.  Nonspecific lateral ST changes without STEMI criteria. ____________________________________________  RADIOLOGY  ED MD interpretation: CT renal study reviewed by me without evidence of urologic obstruction  Official radiology report(s): CT Renal Stone Study  Result Date: 06/06/2020 CLINICAL DATA:  Right-sided flank pain EXAM: CT ABDOMEN AND PELVIS WITHOUT CONTRAST TECHNIQUE: Multidetector CT imaging of the abdomen and pelvis was performed following the standard protocol without IV contrast. COMPARISON:  04/25/2006 FINDINGS: Lower chest: Lung bases are free of acute infiltrate or sizable effusion. Hepatobiliary: Gallbladder is within normal limits. Scattered small cysts are noted in the liver. Pancreas: Unremarkable. No pancreatic ductal dilatation or surrounding inflammatory changes. Spleen: Normal in size without focal abnormality. Adrenals/Urinary Tract: Adrenal glands are  within normal limits. Bilateral nonobstructing renal calculi are seen largest of these on the right measures 6.4 mm. Largest of these on the left measures 6 mm. No ureteral obstructive changes are noted. The bladder is decompressed. Stomach/Bowel: Scattered diverticular change of the colon is noted. No evidence of diverticulitis is seen. The appendix is well visualized and within normal limits. Small bowel and stomach are unremarkable. Vascular/Lymphatic: Aortic atherosclerosis. No enlarged abdominal or pelvic lymph nodes. Reproductive: A pessary is noted in place. Uterus has been surgically removed. No adnexal mass is noted. Other: No abdominal wall hernia or abnormality. No abdominopelvic ascites. Musculoskeletal: Degenerative changes of lumbar spine are noted. Anterolisthesis of L4 on L5 is noted of a degenerative nature. Additionally endplate compression deformity of L2 inferiorly is noted. This has a chronic appearance as well. IMPRESSION: Bilateral nonobstructing renal calculi. Diverticulosis without diverticulitis. Degenerative change of the lumbar spine as described. Electronically Signed   By: Alcide Clever M.D.   On: 06/06/2020 15:07    ____________________________________________   PROCEDURES and INTERVENTIONS  Procedure(s) performed (including Critical Care):  .1-3 Lead EKG Interpretation Performed by: Delton Prairie, MD Authorized by: Delton Prairie, MD     Interpretation: normal     ECG rate:  76   ECG rate assessment: normal     Rhythm: sinus rhythm     Ectopy: none     Conduction: normal  Medications  lidocaine (LIDODERM) 5 % 1 patch (1 patch Transdermal Patch Applied 06/06/20 1409)  acetaminophen (TYLENOL) tablet 1,000 mg (1,000 mg Oral Given 06/06/20 1409)  cefTRIAXone (ROCEPHIN) 1 g in sodium chloride 0.9 % 100 mL IVPB (1 g Intravenous New Bag/Given 06/06/20 1502)    ____________________________________________   MDM / ED COURSE   Pleasant and quite functional  87 year old woman presents from home for evaluation of urinary frequency and back pain, with evidence of UTI.  Normal vitals.  Exam with right-sided CVA tenderness and a benign frontal abdomen.  Blood work with intact renal function.  Urine with infectious features.  CT demonstrates no ureterolithiasis or urologic obstruction.  No CT evidence of pyelonephritis, But considering her flank pain alongside this, and her history of this, I think would be prudent to admit for IV antibiotics so we will discussed the case with hospitalist for admission.     ____________________________________________   FINAL CLINICAL IMPRESSION(S) / ED DIAGNOSES  Final diagnoses:  Lower urinary tract infectious disease  Acute right-sided low back pain without sciatica     ED Discharge Orders    None       Doralyn Kirkes   Note:  This document was prepared using Dragon voice recognition software and may include unintentional dictation errors.   Delton Prairie, MD 06/06/20 1539

## 2020-06-06 NOTE — ED Notes (Signed)
Family updated.

## 2020-06-06 NOTE — H&P (Signed)
History and Physical    Samantha Saunders DQQ:[id number] DOB: 1926-04-29 DOA: 06/06/2020  Referring MD/NP/PA:   PCP: Toy Cookey, FNP   Patient coming from:  The patient is coming from home.  At baseline, pt is independent for most of ADL.        Chief Complaint: Right flank pain, increased urinary frequency  HPI: Samantha Saunders is a 87 y.o. female with medical history significant of hypertension, hyperlipidemia, left bundle blockage, UTI, syncope, rectocele, who presents with right flank pain, increased urinary frequency.  Patient stated her right flank pain started last night, which is constant, sharp, moderate, nonradiating.  She thinks that this is due to kidney pain.  She has increased urinary frequency, denies dysuria or burning on urination.  No nausea, vomiting, diarrhea or abdominal pain.  Denies chest pain, cough, shortness breath.  No fever or chills.  ED Course: pt was found to have WBC 6.5, urinalysis (hazy appearance, large amount of leukocyte, rare bacteria, WBC 21-50), pending COVID-19 PCR, electrolytes renal function okay, temperature normal, blood pressure 135/57, heart rate 79, RR 16, oxygen saturation 99% on room air.  CT per renal stone protocol showed nonobstructive kidney stone and degenerative lumbar disc disease.  Patient is placed on MedSurg bed of observation.  Review of Systems:   General: no fevers, chills, no body weight gain, has fatigue HEENT: no blurry vision, hearing changes or sore throat Respiratory: no dyspnea, coughing, wheezing CV: no chest pain, no palpitations GI: no nausea, vomiting, abdominal pain, diarrhea, constipation GU: no dysuria, burning on urination, has increased urinary frequency, no hematuria  Ext: no leg edema Neuro: no unilateral weakness, numbness, or tingling, no vision change or hearing loss Skin: no rash, no skin tear. MSK: No muscle spasm, no deformity, no limitation of range of movement in spin.  Has right flank pain Heme:  No easy bruising.  Travel history: No recent long distant travel.  Allergy:  Allergies  Allergen Reactions  . Statins Hives    Past Medical History:  Diagnosis Date  . Heart murmur   . History of kidney infection   . Hyperlipidemia   . Hypertension   . LBBB (left bundle branch block)   . Syncope and collapse     Past Surgical History:  Procedure Laterality Date  . ABDOMINAL HYSTERECTOMY      Social History:  reports that she has never smoked. She has never used smokeless tobacco. She reports that she does not drink alcohol and does not use drugs.  Family History:  Family History  Problem Relation Age of Onset  . Stroke Mother   . Hypertension Mother      Prior to Admission medications   Medication Sig Start Date End Date Taking? Authorizing Provider  amLODipine (NORVASC) 10 MG tablet Take 1 tablet (10 mg total) by mouth daily. 04/21/13   Iran Ouch, MD  aspirin 81 MG tablet Take 81 mg by mouth daily.    [provider]  ezetimibe (ZETIA) 10 MG tablet Take 10 mg by mouth daily.    [provider]    Physical Exam: Vitals:   06/06/20 1211 06/06/20 1213  BP:  (!) 135/57  Pulse:  79  Resp:  16  Temp:  98.3 F (36.8 C)  TempSrc:  Oral  SpO2:  99%  Weight: 54.9 kg   Height: 5\' 5"  (1.651 m)    General: Not in acute distress HEENT:       Eyes: PERRL, EOMI, no  scleral icterus.       ENT: No discharge from the ears and nose, no pharynx injection, no tonsillar enlargement.        Neck: No JVD, no bruit, no mass felt. Heme: No neck lymph node enlargement. Cardiac: S1/S2, RRR, No murmurs, No gallops or rubs. Respiratory: No rales, wheezing, rhonchi or rubs. GI: Soft, nondistended, nontender, no rebound pain, no organomegaly, BS present. GU: No hematuria. Positive right CVA tenderness Ext: No pitting leg edema bilaterally. 1+DP/PT pulse bilaterally. Musculoskeletal: No joint deformities, No joint redness or warmth, no limitation of ROM in  spin. Skin: No rashes.  Neuro: Alert, oriented X3, cranial nerves II-XII grossly intact, moves all extremities normally.  Psych: Patient is not psychotic, no suicidal or hemocidal ideation.  Labs on Admission: I have personally reviewed following labs and imaging studies  CBC: Recent Labs  Lab 06/06/20 1413  WBC 6.5  NEUTROABS 4.4  HGB 12.9  HCT 38.0  MCV 87.0  PLT 269   Basic Metabolic Panel: Recent Labs  Lab 06/06/20 1413  NA 135  K 3.7  CL 100  CO2 25  GLUCOSE 107*  BUN 12  CREATININE 0.95  CALCIUM 9.7   GFR: Estimated Creatinine Clearance: 33.4 mL/min (by C-G formula based on SCr of 0.95 mg/dL). Liver Function Tests: Recent Labs  Lab 06/06/20 1413  AST 27  ALT 16  ALKPHOS 45  BILITOT 0.9  PROT 8.3*  ALBUMIN 4.4   No results for input(s): LIPASE, AMYLASE in the last 168 hours. No results for input(s): AMMONIA in the last 168 hours. Coagulation Profile: No results for input(s): INR, PROTIME in the last 168 hours. Cardiac Enzymes: No results for input(s): CKTOTAL, CKMB, CKMBINDEX, TROPONINI in the last 168 hours. BNP (last 3 results) No results for input(s): PROBNP in the last 8760 hours. HbA1C: No results for input(s): HGBA1C in the last 72 hours. CBG: No results for input(s): GLUCAP in the last 168 hours. Lipid Profile: No results for input(s): CHOL, HDL, LDLCALC, TRIG, CHOLHDL, LDLDIRECT in the last 72 hours. Thyroid Function Tests: No results for input(s): TSH, T4TOTAL, FREET4, T3FREE, THYROIDAB in the last 72 hours. Anemia Panel: No results for input(s): VITAMINB12, FOLATE, FERRITIN, TIBC, IRON, RETICCTPCT in the last 72 hours. Urine analysis:    Component Value Date/Time   COLORURINE YELLOW (A) 06/06/2020 1413   APPEARANCEUR HAZY (A) 06/06/2020 1413   APPEARANCEUR Hazy 01/01/2014 1116   LABSPEC 1.012 06/06/2020 1413   LABSPEC 1.012 01/01/2014 1116   PHURINE 6.0 06/06/2020 1413   GLUCOSEU NEGATIVE 06/06/2020 1413   GLUCOSEU Negative  01/01/2014 1116   HGBUR LARGE (A) 06/06/2020 1413   BILIRUBINUR NEGATIVE 06/06/2020 1413   BILIRUBINUR Negative 01/01/2014 1116   KETONESUR NEGATIVE 06/06/2020 1413   PROTEINUR NEGATIVE 06/06/2020 1413   NITRITE NEGATIVE 06/06/2020 1413   LEUKOCYTESUR LARGE (A) 06/06/2020 1413   LEUKOCYTESUR 3+ 01/01/2014 1116   Sepsis Labs: @LABRCNTIP (procalcitonin:4,lacticidven:4) ) Recent Results (from the past 240 hour(s))  Resp Panel by RT-PCR (Flu A&B, Covid) Nasopharyngeal Swab     Status: None   Collection Time: 06/06/20  3:01 PM   Specimen: Nasopharyngeal Swab; Nasopharyngeal(NP) swabs in vial transport medium  Result Value Ref Range Status   SARS Coronavirus 2 by RT PCR NEGATIVE NEGATIVE Final    Comment: (NOTE) SARS-CoV-2 target nucleic acids are NOT DETECTED.  The SARS-CoV-2 RNA is generally detectable in upper respiratory specimens during the acute phase of infection. The lowest concentration of SARS-CoV-2 viral copies this assay can detect  is 138 copies/mL. A negative result does not preclude SARS-Cov-2 infection and should not be used as the sole basis for treatment or other patient management decisions. A negative result may occur with  improper specimen collection/handling, submission of specimen other than nasopharyngeal swab, presence of viral mutation(s) within the areas targeted by this assay, and inadequate number of viral copies(<138 copies/mL). A negative result must be combined with clinical observations, patient history, and epidemiological information. The expected result is Negative.  Fact Sheet for Patients:  BloggerCourse.com  Fact Sheet for Healthcare Providers:  SeriousBroker.it  This test is no t yet approved or cleared by the [place] FDA and  has been authorized for detection and/or diagnosis of SARS-CoV-2 by FDA under an Emergency Use Authorization (EUA). This EUA will remain  in effect (meaning this  test can be used) for the duration of the COVID-19 declaration under Section 564(b)(1) of the Act, 21 U.S.C.section 360bbb-3(b)(1), unless the authorization is terminated  or revoked sooner.       Influenza A by PCR NEGATIVE NEGATIVE Final   Influenza B by PCR NEGATIVE NEGATIVE Final    Comment: (NOTE) The Xpert Xpress SARS-CoV-2/FLU/RSV plus assay is intended as an aid in the diagnosis of influenza from Nasopharyngeal swab specimens and should not be used as a sole basis for treatment. Nasal washings and aspirates are unacceptable for Xpert Xpress SARS-CoV-2/FLU/RSV testing.  Fact Sheet for Patients: BloggerCourse.com  Fact Sheet for Healthcare Providers: SeriousBroker.it  This test is not yet approved or cleared by the [place] FDA and has been authorized for detection and/or diagnosis of SARS-CoV-2 by FDA under an Emergency Use Authorization (EUA). This EUA will remain in effect (meaning this test can be used) for the duration of the COVID-19 declaration under Section 564(b)(1) of the Act, 21 U.S.C. section 360bbb-3(b)(1), unless the authorization is terminated or revoked.  Performed at [facility], [address] [place], [place] [address]      Radiological Exams on Admission: CT Renal Stone Study  Result Date: 06/06/2020 CLINICAL DATA:  Right-sided flank pain EXAM: CT ABDOMEN AND PELVIS WITHOUT CONTRAST TECHNIQUE: Multidetector CT imaging of the abdomen and pelvis was performed following the standard protocol without IV contrast. COMPARISON:  04/25/2006 FINDINGS: Lower chest: Lung bases are free of acute infiltrate or sizable effusion. Hepatobiliary: Gallbladder is within normal limits. Scattered small cysts are noted in the liver. Pancreas: Unremarkable. No pancreatic ductal dilatation or surrounding inflammatory changes. Spleen: Normal in size without focal abnormality. Adrenals/Urinary Tract: Adrenal  glands are within normal limits. Bilateral nonobstructing renal calculi are seen largest of these on the right measures 6.4 mm. Largest of these on the left measures 6 mm. No ureteral obstructive changes are noted. The bladder is decompressed. Stomach/Bowel: Scattered diverticular change of the colon is noted. No evidence of diverticulitis is seen. The appendix is well visualized and within normal limits. Small bowel and stomach are unremarkable. Vascular/Lymphatic: Aortic atherosclerosis. No enlarged abdominal or pelvic lymph nodes. Reproductive: A pessary is noted in place. Uterus has been surgically removed. No adnexal mass is noted. Other: No abdominal wall hernia or abnormality. No abdominopelvic ascites. Musculoskeletal: Degenerative changes of lumbar spine are noted. Anterolisthesis of L4 on L5 is noted of a degenerative nature. Additionally endplate compression deformity of L2 inferiorly is noted. This has a chronic appearance as well. IMPRESSION: Bilateral nonobstructing renal calculi. Diverticulosis without diverticulitis. Degenerative change of the lumbar spine as described. Electronically Signed   By: Alcide Clever M.D.   On:  06/06/2020 15:07     EKG:  Not done in ED  Assessment/Plan Principal Problem:   Acute pyelonephritis Active Problems:   Hypertension   HLD (hyperlipidemia)   Acute pyelonephritis: Patient has had increased urinary frequency, left flank pain, positive right CVA tenderness, positive urinalysis, clinically consistent with pyelonephritis.  CT scan showed nonobstructive kidney stone.  Patient does not have fever or leukocytosis.  Clinically not septic.  -  Place on med-surg bed for obs -  Ceftriaxone by IV - Follow up results of urine and amend antibiotic regimen if needed per sensitivity results - prn Zofran for nausea  Hypertension -IV hydralazine as needed -Continue home amlodipine  HLD (hyperlipidemia) -Zetia    DVT ppx: SQ Lovenox Code Status: DNR (I  discussed with patient and explained the meaning of CODE STATUS. Patient wants sto be DNR), Her daughter agrees with her decision. Family Communication:   Yes, patient's daughter by phone Disposition Plan:  Anticipate discharge back to previous environment Consults called:  none Admission status and Level of care: Med-Surg:   for obs   Status is: Observation  The patient remains OBS appropriate and will d/c before 2 midnights.  Dispo: The patient is from: Home              Anticipated d/c is to: Home              Patient currently is not medically stable to d/c.   Difficult to place patient No           Date of Service 06/06/2020    Lorretta HarpXilin Harry Shuck [facility]   If 7PM-7AM, please contact night-coverage [email or web address] 06/06/2020, 5:14 PM

## 2020-06-06 NOTE — Progress Notes (Signed)
Patient arrived from the ED  

## 2020-06-06 NOTE — ED Triage Notes (Signed)
Pt to ED via POV stating that she is having pain in her back. Pt states that the pain started last night but got worse today. Pt is in NAD.

## 2020-06-07 DIAGNOSIS — Z20822 Contact with and (suspected) exposure to covid-19: Secondary | ICD-10-CM | POA: Diagnosis present

## 2020-06-07 DIAGNOSIS — Z87440 Personal history of urinary (tract) infections: Secondary | ICD-10-CM | POA: Diagnosis not present

## 2020-06-07 DIAGNOSIS — Z66 Do not resuscitate: Secondary | ICD-10-CM | POA: Diagnosis present

## 2020-06-07 DIAGNOSIS — N12 Tubulo-interstitial nephritis, not specified as acute or chronic: Secondary | ICD-10-CM | POA: Diagnosis present

## 2020-06-07 DIAGNOSIS — N10 Acute tubulo-interstitial nephritis: Secondary | ICD-10-CM | POA: Diagnosis present

## 2020-06-07 DIAGNOSIS — Z7982 Long term (current) use of aspirin: Secondary | ICD-10-CM | POA: Diagnosis not present

## 2020-06-07 DIAGNOSIS — N390 Urinary tract infection, site not specified: Secondary | ICD-10-CM | POA: Diagnosis present

## 2020-06-07 DIAGNOSIS — M5136 Other intervertebral disc degeneration, lumbar region: Secondary | ICD-10-CM | POA: Diagnosis present

## 2020-06-07 DIAGNOSIS — E785 Hyperlipidemia, unspecified: Secondary | ICD-10-CM | POA: Diagnosis present

## 2020-06-07 DIAGNOSIS — Z8249 Family history of ischemic heart disease and other diseases of the circulatory system: Secondary | ICD-10-CM | POA: Diagnosis not present

## 2020-06-07 DIAGNOSIS — I447 Left bundle-branch block, unspecified: Secondary | ICD-10-CM | POA: Diagnosis present

## 2020-06-07 DIAGNOSIS — F0390 Unspecified dementia without behavioral disturbance: Secondary | ICD-10-CM | POA: Diagnosis present

## 2020-06-07 DIAGNOSIS — I10 Essential (primary) hypertension: Secondary | ICD-10-CM | POA: Diagnosis present

## 2020-06-07 DIAGNOSIS — Z90710 Acquired absence of both cervix and uterus: Secondary | ICD-10-CM | POA: Diagnosis not present

## 2020-06-07 DIAGNOSIS — N200 Calculus of kidney: Secondary | ICD-10-CM | POA: Diagnosis present

## 2020-06-07 DIAGNOSIS — N3001 Acute cystitis with hematuria: Secondary | ICD-10-CM | POA: Diagnosis present

## 2020-06-07 DIAGNOSIS — Z79899 Other long term (current) drug therapy: Secondary | ICD-10-CM | POA: Diagnosis not present

## 2020-06-07 LAB — CBC
HCT: 32.5 % — ABNORMAL LOW (ref 36.0–46.0)
Hemoglobin: 10.9 g/dL — ABNORMAL LOW (ref 12.0–15.0)
MCH: 29.2 pg (ref 26.0–34.0)
MCHC: 33.5 g/dL (ref 30.0–36.0)
MCV: 87.1 fL (ref 80.0–100.0)
Platelets: 233 10*3/uL (ref 150–400)
RBC: 3.73 MIL/uL — ABNORMAL LOW (ref 3.87–5.11)
RDW: 13.4 % (ref 11.5–15.5)
WBC: 4.3 10*3/uL (ref 4.0–10.5)
nRBC: 0 % (ref 0.0–0.2)

## 2020-06-07 NOTE — Progress Notes (Signed)
PROGRESS NOTE   HPI was taken from Dr. Clyde Lundborg: Shepard General is a 87 y.o. female with medical history significant of hypertension, hyperlipidemia, left bundle blockage, UTI, syncope, rectocele, who presents with right flank pain, increased urinary frequency.  Patient stated her right flank pain started last night, which is constant, sharp, moderate, nonradiating.  She thinks that this is due to kidney pain.  She has increased urinary frequency, denies dysuria or burning on urination.  No nausea, vomiting, diarrhea or abdominal pain.  Denies chest pain, cough, shortness breath.  No fever or chills.  ED Course: pt was found to have WBC 6.5, urinalysis (hazy appearance, large amount of leukocyte, rare bacteria, WBC 21-50), pending COVID-19 PCR, electrolytes renal function okay, temperature normal, blood pressure 135/57, heart rate 79, RR 16, oxygen saturation 99% on room air.  CT per renal stone protocol showed nonobstructive kidney stone and degenerative lumbar disc disease.  Patient is placed on MedSurg bed of observation.   SHAKETHA JEON  CBJ:[id number] DOB: September 07, 1926 DOA: 06/06/2020 PCP: Toy Cookey, FNP   Assessment & Plan:   Principal Problem:   Acute pyelonephritis Active Problems:   Hypertension   HLD (hyperlipidemia)   Acute pyelonephritis: w/ nonobstructive kidney stone as per CT scan. Continue on IV ceftriaxone. Urine cx is pending   HTN: continue on amlodipine. IV hydralazine   HLD: continue on zetia   DVT prophylaxis: lovenox  Code Status: full  Family Communication: discussed pt's care w/ pt's daughter, Malachi Bonds, and answered her questions  Disposition Plan: likely d/c back home   Level of care: Med-Surg   Status is: Inpatient  Remains inpatient appropriate because:IV treatments appropriate due to intensity of illness or inability to take PO and Inpatient level of care appropriate due to severity of illness, waiting on urine culture results    Dispo: The patient  is from: Home              Anticipated d/c is to: Home              Patient currently is not medically stable to d/c.   Difficult to place patient : unclear      Consultants:      Procedures:    Antimicrobials: ceftriaxone    Subjective: Pt c/o fatigue   Objective: Vitals:   06/06/20 1716 06/06/20 1928 06/06/20 2100 06/07/20 0151  BP: (!) 141/72 117/63 (!) 141/74 104/72  Pulse: 94 81 87 86  Resp: 16 (!) 24 16 18   Temp: 98.3 F (36.8 C) 98 F (36.7 C) 98.3 F (36.8 C) 98.5 F (36.9 C)  TempSrc:  Oral Oral Oral  SpO2: 98% 99% 98% 97%  Weight:      Height:        Intake/Output Summary (Last 24 hours) at 06/07/2020 0835 Last data filed at 06/06/2020 1857 Gross per 24 hour  Intake 340 ml  Output --  Net 340 ml   Filed Weights   06/06/20 1211  Weight: 54.9 kg    Examination:  General exam: Appears calm and comfortable  Respiratory system: Clear to auscultation. Respiratory effort normal. Cardiovascular system: S1 & S2 +. No , rubs, gallops or clicks.  Gastrointestinal system: Abdomen is nondistended, soft and nontender. Normal bowel sounds heard. Central nervous system: Alert and oriented. Moves all extremities  Psychiatry: Judgement and insight appear normal. Mood & affect appropriate.     Data Reviewed: I have personally reviewed following labs and imaging studies  CBC: Recent Labs  Lab 06/06/20  1413 06/07/20 0601  WBC 6.5 4.3  NEUTROABS 4.4  --   HGB 12.9 10.9*  HCT 38.0 32.5*  MCV 87.0 87.1  PLT 269 233   Basic Metabolic Panel: Recent Labs  Lab 06/06/20 1413  NA 135  K 3.7  CL 100  CO2 25  GLUCOSE 107*  BUN 12  CREATININE 0.95  CALCIUM 9.7   GFR: Estimated Creatinine Clearance: 33.4 mL/min (by C-G formula based on SCr of 0.95 mg/dL). Liver Function Tests: Recent Labs  Lab 06/06/20 1413  AST 27  ALT 16  ALKPHOS 45  BILITOT 0.9  PROT 8.3*  ALBUMIN 4.4   No results for input(s): LIPASE, AMYLASE in the last 168 hours. No  results for input(s): AMMONIA in the last 168 hours. Coagulation Profile: No results for input(s): INR, PROTIME in the last 168 hours. Cardiac Enzymes: No results for input(s): CKTOTAL, CKMB, CKMBINDEX, TROPONINI in the last 168 hours. BNP (last 3 results) No results for input(s): PROBNP in the last 8760 hours. HbA1C: No results for input(s): HGBA1C in the last 72 hours. CBG: No results for input(s): GLUCAP in the last 168 hours. Lipid Profile: No results for input(s): CHOL, HDL, LDLCALC, TRIG, CHOLHDL, LDLDIRECT in the last 72 hours. Thyroid Function Tests: No results for input(s): TSH, T4TOTAL, FREET4, T3FREE, THYROIDAB in the last 72 hours. Anemia Panel: No results for input(s): VITAMINB12, FOLATE, FERRITIN, TIBC, IRON, RETICCTPCT in the last 72 hours. Sepsis Labs: No results for input(s): PROCALCITON, LATICACIDVEN in the last 168 hours.  Recent Results (from the past 240 hour(s))  Resp Panel by RT-PCR (Flu A&B, Covid) Nasopharyngeal Swab     Status: None   Collection Time: 06/06/20  3:01 PM   Specimen: Nasopharyngeal Swab; Nasopharyngeal(NP) swabs in vial transport medium  Result Value Ref Range Status   SARS Coronavirus 2 by RT PCR NEGATIVE NEGATIVE Final    Comment: (NOTE) SARS-CoV-2 target nucleic acids are NOT DETECTED.  The SARS-CoV-2 RNA is generally detectable in upper respiratory specimens during the acute phase of infection. The lowest concentration of SARS-CoV-2 viral copies this assay can detect is 138 copies/mL. A negative result does not preclude SARS-Cov-2 infection and should not be used as the sole basis for treatment or other patient management decisions. A negative result may occur with  improper specimen collection/handling, submission of specimen other than nasopharyngeal swab, presence of viral mutation(s) within the areas targeted by this assay, and inadequate number of viral copies(<138 copies/mL). A negative result must be combined with clinical  observations, patient history, and epidemiological information. The expected result is Negative.  Fact Sheet for Patients:  BloggerCourse.com  Fact Sheet for Healthcare Providers:  SeriousBroker.it  This test is no t yet approved or cleared by the [place] FDA and  has been authorized for detection and/or diagnosis of SARS-CoV-2 by FDA under an Emergency Use Authorization (EUA). This EUA will remain  in effect (meaning this test can be used) for the duration of the COVID-19 declaration under Section 564(b)(1) of the Act, 21 U.S.C.section 360bbb-3(b)(1), unless the authorization is terminated  or revoked sooner.       Influenza A by PCR NEGATIVE NEGATIVE Final   Influenza B by PCR NEGATIVE NEGATIVE Final    Comment: (NOTE) The Xpert Xpress SARS-CoV-2/FLU/RSV plus assay is intended as an aid in the diagnosis of influenza from Nasopharyngeal swab specimens and should not be used as a sole basis for treatment. Nasal washings and aspirates are unacceptable for Xpert Xpress SARS-CoV-2/FLU/RSV testing.  Fact  Sheet for Patients: BloggerCourse.com  Fact Sheet for Healthcare Providers: SeriousBroker.it  This test is not yet approved or cleared by the [place] FDA and has been authorized for detection and/or diagnosis of SARS-CoV-2 by FDA under an Emergency Use Authorization (EUA). This EUA will remain in effect (meaning this test can be used) for the duration of the COVID-19 declaration under Section 564(b)(1) of the Act, 21 U.S.C. section 360bbb-3(b)(1), unless the authorization is terminated or revoked.  Performed at [facility], [address] [place], [place] [address]          Radiology Studies: CT Renal Stone Study  Result Date: 06/06/2020 CLINICAL DATA:  Right-sided flank pain EXAM: CT ABDOMEN AND PELVIS WITHOUT CONTRAST TECHNIQUE: Multidetector CT  imaging of the abdomen and pelvis was performed following the standard protocol without IV contrast. COMPARISON:  04/25/2006 FINDINGS: Lower chest: Lung bases are free of acute infiltrate or sizable effusion. Hepatobiliary: Gallbladder is within normal limits. Scattered small cysts are noted in the liver. Pancreas: Unremarkable. No pancreatic ductal dilatation or surrounding inflammatory changes. Spleen: Normal in size without focal abnormality. Adrenals/Urinary Tract: Adrenal glands are within normal limits. Bilateral nonobstructing renal calculi are seen largest of these on the right measures 6.4 mm. Largest of these on the left measures 6 mm. No ureteral obstructive changes are noted. The bladder is decompressed. Stomach/Bowel: Scattered diverticular change of the colon is noted. No evidence of diverticulitis is seen. The appendix is well visualized and within normal limits. Small bowel and stomach are unremarkable. Vascular/Lymphatic: Aortic atherosclerosis. No enlarged abdominal or pelvic lymph nodes. Reproductive: A pessary is noted in place. Uterus has been surgically removed. No adnexal mass is noted. Other: No abdominal wall hernia or abnormality. No abdominopelvic ascites. Musculoskeletal: Degenerative changes of lumbar spine are noted. Anterolisthesis of L4 on L5 is noted of a degenerative nature. Additionally endplate compression deformity of L2 inferiorly is noted. This has a chronic appearance as well. IMPRESSION: Bilateral nonobstructing renal calculi. Diverticulosis without diverticulitis. Degenerative change of the lumbar spine as described. Electronically Signed   By: Alcide Clever M.D.   On: 06/06/2020 15:07        Scheduled Meds: . amLODipine  10 mg Oral Daily  . aspirin  81 mg Oral Daily  . enoxaparin (LOVENOX) injection  40 mg Subcutaneous Q24H  . ezetimibe  10 mg Oral Daily   Continuous Infusions: . cefTRIAXone (ROCEPHIN)  IV       LOS: 0 days    Time spent: 32 mins      Charise Killian, MD [facility] Pager [phone]  If 7PM-7AM, please contact night-coverage 06/07/2020, 8:35 AM

## 2020-06-07 NOTE — Plan of Care (Signed)

## 2020-06-08 DIAGNOSIS — F0390 Unspecified dementia without behavioral disturbance: Secondary | ICD-10-CM

## 2020-06-08 DIAGNOSIS — I10 Essential (primary) hypertension: Secondary | ICD-10-CM | POA: Diagnosis not present

## 2020-06-08 DIAGNOSIS — N3001 Acute cystitis with hematuria: Secondary | ICD-10-CM

## 2020-06-08 DIAGNOSIS — E785 Hyperlipidemia, unspecified: Secondary | ICD-10-CM | POA: Diagnosis not present

## 2020-06-08 LAB — CBC
HCT: 37.5 % (ref 36.0–46.0)
Hemoglobin: 12.5 g/dL (ref 12.0–15.0)
MCH: 29.6 pg (ref 26.0–34.0)
MCHC: 33.3 g/dL (ref 30.0–36.0)
MCV: 88.7 fL (ref 80.0–100.0)
Platelets: 270 10*3/uL (ref 150–400)
RBC: 4.23 MIL/uL (ref 3.87–5.11)
RDW: 13.4 % (ref 11.5–15.5)
WBC: 6.1 10*3/uL (ref 4.0–10.5)
nRBC: 0 % (ref 0.0–0.2)

## 2020-06-08 LAB — BASIC METABOLIC PANEL
Anion gap: 7 (ref 5–15)
BUN: 12 mg/dL (ref 8–23)
CO2: 29 mmol/L (ref 22–32)
Calcium: 9.3 mg/dL (ref 8.9–10.3)
Chloride: 103 mmol/L (ref 98–111)
Creatinine, Ser: 0.82 mg/dL (ref 0.44–1.00)
GFR, Estimated: >60 mL/min (ref >60)
Glucose, Bld: 91 mg/dL (ref 70–99)
Potassium: 4 mmol/L (ref 3.5–5.1)
Sodium: 139 mmol/L (ref 135–145)

## 2020-06-08 LAB — URINE CULTURE: Culture: <10000 — AB

## 2020-06-08 MED ORDER — CEPHALEXIN 500 MG PO CAPS: 500.0000 mg | ORAL_CAPSULE | Freq: Three times a day (TID) | ORAL | 0 refills | Status: AC

## 2020-06-08 NOTE — Discharge Summary (Signed)
[facility] [place] [facility]   PATIENT NAME: Samantha Saunders    MR#:  [id number]  DATE OF BIRTH:  10/24/1926  DATE OF ADMISSION:  06/06/2020 ADMITTING PHYSICIAN: Charise Killian, MD  DATE OF DISCHARGE: 06/08/2020 12:14 PM  PRIMARY CARE PHYSICIAN: Toy Cookey, FNP    ADMISSION DIAGNOSIS:  Lower urinary tract infectious disease [N39.0] Acute pyelonephritis [N10] Acute right-sided low back pain without sciatica [M54.50] Pyelonephritis [N12]  DISCHARGE DIAGNOSIS:  Principal Problem:   Acute pyelonephritis Active Problems:   Hypertension   HLD (hyperlipidemia)   Pyelonephritis   SECONDARY DIAGNOSIS:   Past Medical History:  Diagnosis Date  . Heart murmur   . History of kidney infection   . Hyperlipidemia   . Hypertension   . LBBB (left bundle branch block)   . Syncope and collapse     HOSPITAL COURSE:   1.  Acute cystitis with hematuria.  Patient had a CT scan that did show nonobstructing kidney stone.  Patient was started on IV Rocephin.  Urine culture with insignificant growth.  The patient not having any back pain at this time and feeling well.  We will give a few more days of Keflex upon going home.  Encourage staying hydrated. 2.  Essential hypertension on Norvasc 3.  Hyperlipidemia on Zetia 4.  Dementia without behavioral disturbance.  Case discussed with daughter.  Patient lives in independent living.  DISCHARGE CONDITIONS:   Satisfactory  CONSULTS OBTAINED:  None  DRUG ALLERGIES:   Allergies  Allergen Reactions  . Statins Hives    DISCHARGE MEDICATIONS:   Allergies as of 06/08/2020      Reactions   Statins Hives      Medication List    TAKE these medications   amLODipine 10 MG tablet Commonly known as: NORVASC Take 1 tablet (10 mg total) by mouth daily.   aspirin 81 MG tablet Take 81 mg by mouth daily.   cephALEXin 500 MG capsule Commonly known as: KEFLEX Take 1 capsule (500 mg total) by mouth 3 (three)  times daily for 3 days.   ezetimibe 10 MG tablet Commonly known as: ZETIA Take 10 mg by mouth daily.        DISCHARGE INSTRUCTIONS:   Follow-up PMD 5 days  If you experience worsening of your admission symptoms, develop shortness of breath, life threatening emergency, suicidal or homicidal thoughts you must seek medical attention immediately by calling 911 or calling your MD immediately  if symptoms less severe.  You Must read complete instructions/literature along with all the possible adverse reactions/side effects for all the Medicines you take and that have been prescribed to you. Take any new Medicines after you have completely understood and accept all the possible adverse reactions/side effects.   Please note  You were cared for by a hospitalist during your hospital stay. If you have any questions about your discharge medications or the care you received while you were in the hospital after you are discharged, you can call the unit and asked to speak with the hospitalist on call if the hospitalist that took care of you is not available. Once you are discharged, your primary care physician will handle any further medical issues. Please note that NO REFILLS for any discharge medications will be authorized once you are discharged, as it is imperative that you return to your primary care physician (or establish a relationship with a primary care physician if you do not have one) for your aftercare needs so that they  can reassess your need for medications and monitor your lab values.    Today   CHIEF COMPLAINT:   Chief Complaint  Patient presents with  . Back Pain    HISTORY OF PRESENT ILLNESS:  Samantha Saunders  is a 87 y.o. female came in with back pain   VITAL SIGNS:  Blood pressure 135/66, pulse 86, temperature 97.8 F (36.6 C), temperature source Oral, resp. rate 20, height 5\' 5"  (1.651 m), weight 54.9 kg, SpO2 100 %.  I/O:    Intake/Output Summary (Last 24 hours) at  06/08/2020 1645 Last data filed at 06/08/2020 1021 Gross per 24 hour  Intake 480 ml  Output --  Net 480 ml    PHYSICAL EXAMINATION:  GENERAL:  87 y.o.-year-old patient lying in the bed with no acute distress.  EYES: Pupils equal, round, reactive to light and accommodation. No scleral icterus. Extraocular muscles intact.  HEENT: Head atraumatic, normocephalic. Oropharynx and nasopharynx clear.  LUNGS: Normal breath sounds bilaterally, no wheezing, rales,rhonchi or crepitation. No use of accessory muscles of respiration.  CARDIOVASCULAR: S1, S2 normal. No murmurs, rubs, or gallops.  ABDOMEN: Soft, non-tender, non-distended.  EXTREMITIES: No pedal edema.  NEUROLOGIC: Cranial nerves II through XII are intact. Muscle strength 5/5 in all extremities. Sensation intact. Gait not checked.  PSYCHIATRIC: The patient is alert and answers questions appropriately but could not tell me the reason why she was here.82  SKIN: No obvious rash, lesion, or ulcer.   DATA REVIEW:   CBC Recent Labs  Lab 06/08/20 0429  WBC 6.1  HGB 12.5  HCT 37.5  PLT 270    Chemistries  Recent Labs  Lab 06/06/20 1413 06/08/20 0429  NA 135 139  K 3.7 4.0  CL 100 103  CO2 25 29  GLUCOSE 107* 91  BUN 12 12  CREATININE 0.95 0.82  CALCIUM 9.7 9.3  AST 27  --   ALT 16  --   ALKPHOS 45  --   BILITOT 0.9  --      Microbiology Results  Results for orders placed or performed during the hospital encounter of 06/06/20  Urine culture     Status: Abnormal   Collection Time: 06/06/20  2:13 PM   Specimen: Urine, Random  Result Value Ref Range Status   Specimen Description   Final    URINE, RANDOM Performed at [facility], [address], [place], [place]    Special Requests   Final    NONE Performed at [facility], [address], [place], [place]    Culture (A)  Final    <10,000 COLONIES/mL INSIGNIFICANT GROWTH Performed at [facility], [address]  [place], [place]    Report Status 06/08/2020 FINAL  Final  Resp Panel by RT-PCR (Flu A&B, Covid) Nasopharyngeal Swab     Status: None   Collection Time: 06/06/20  3:01 PM   Specimen: Nasopharyngeal Swab; Nasopharyngeal(NP) swabs in vial transport medium  Result Value Ref Range Status   SARS Coronavirus 2 by RT PCR NEGATIVE NEGATIVE Final    Comment: (NOTE) SARS-CoV-2 target nucleic acids are NOT DETECTED.  The SARS-CoV-2 RNA is generally detectable in upper respiratory specimens during the acute phase of infection. The lowest concentration of SARS-CoV-2 viral copies this assay can detect is 138 copies/mL. A negative result does not preclude SARS-Cov-2 infection and should not be used as the sole basis for treatment or other patient management decisions. A negative result may occur  with  improper specimen collection/handling, submission of specimen other than nasopharyngeal swab, presence of viral mutation(s) within the areas targeted by this assay, and inadequate number of viral copies(<138 copies/mL). A negative result must be combined with clinical observations, patient history, and epidemiological information. The expected result is Negative.  Fact Sheet for Patients:  BloggerCourse.com  Fact Sheet for Healthcare Providers:  SeriousBroker.it  This test is no t yet approved or cleared by the [place] FDA and  has been authorized for detection and/or diagnosis of SARS-CoV-2 by FDA under an Emergency Use Authorization (EUA). This EUA will remain  in effect (meaning this test can be used) for the duration of the COVID-19 declaration under Section 564(b)(1) of the Act, 21 U.S.C.section 360bbb-3(b)(1), unless the authorization is terminated  or revoked sooner.       Influenza A by PCR NEGATIVE NEGATIVE Final   Influenza B by PCR NEGATIVE NEGATIVE Final    Comment: (NOTE) The Xpert Xpress SARS-CoV-2/FLU/RSV plus assay is  intended as an aid in the diagnosis of influenza from Nasopharyngeal swab specimens and should not be used as a sole basis for treatment. Nasal washings and aspirates are unacceptable for Xpert Xpress SARS-CoV-2/FLU/RSV testing.  Fact Sheet for Patients: BloggerCourse.com  Fact Sheet for Healthcare Providers: SeriousBroker.it  This test is not yet approved or cleared by the [place] FDA and has been authorized for detection and/or diagnosis of SARS-CoV-2 by FDA under an Emergency Use Authorization (EUA). This EUA will remain in effect (meaning this test can be used) for the duration of the COVID-19 declaration under Section 564(b)(1) of the Act, 21 U.S.C. section 360bbb-3(b)(1), unless the authorization is terminated or revoked.  Performed at [facility], [address] [place], [place] [address]      Management plans discussed with the patient, family and they are in agreement.  CODE STATUS:     Code Status Orders  (From admission, onward)         Start     Ordered   06/06/20 1705  Do not attempt resuscitation (DNR)  Continuous       Question Answer Comment  In the event of cardiac or respiratory ARREST Do not call a "code blue"   In the event of cardiac or respiratory ARREST Do not perform Intubation, CPR, defibrillation or ACLS   In the event of cardiac or respiratory ARREST Use medication by any route, position, wound care, and other measures to relive pain and suffering. May use oxygen, suction and manual treatment of airway obstruction as needed for comfort.      06/06/20 1704        Code Status History    Date Active Date Inactive Code Status Order ID Comments User Context   12/04/2019 1747 12/06/2019 2003 DNR [id number]  Lorretta Harp, MD Inpatient   12/04/2019 1731 12/04/2019 1747 Full Code [id number]  Lorretta Harp, MD Inpatient   12/04/2019 1727 12/04/2019 1731 DNR [id number]  Lorretta Harp, MD  Inpatient   07/08/2018 1926 07/09/2018 1538 Full Code [id number]  Seals, Milas Kocher, NP ED   Advance Care Planning Activity      TOTAL TIME TAKING CARE OF THIS PATIENT: 35 minutes.    Alford Highland M.D on 06/08/2020 at 4:45 PM  Between 7am to 6pm - Pager - [phone]  After 6pm go to [email or web address] - password [email or web address]  [facility]  CC: Primary care physician; Toy Cookey, FNP

## 2020-06-08 NOTE — Plan of Care (Signed)
  Problem: Education: Goal: Knowledge of General Education information will improve Description: Including pain rating scale, medication(s)/side effects and non-pharmacologic comfort measures 06/08/2020 1138 by Orvan Seen, RN Outcome: Completed/Met 06/08/2020 1011 by Orvan Seen, RN Outcome: Progressing   Problem: Clinical Measurements: Goal: Ability to maintain clinical measurements within normal limits will improve 06/08/2020 1138 by Orvan Seen, RN Outcome: Completed/Met 06/08/2020 1011 by Orvan Seen, RN Outcome: Progressing Goal: Will remain free from infection 06/08/2020 1138 by Orvan Seen, RN Outcome: Completed/Met 06/08/2020 1011 by Orvan Seen, RN Outcome: Progressing Goal: Diagnostic test results will improve 06/08/2020 1138 by Orvan Seen, RN Outcome: Completed/Met 06/08/2020 1011 by Orvan Seen, RN Outcome: Progressing Goal: Respiratory complications will improve 06/08/2020 1138 by Orvan Seen, RN Outcome: Completed/Met 06/08/2020 1011 by Orvan Seen, RN Outcome: Progressing Goal: Cardiovascular complication will be avoided 06/08/2020 1138 by Orvan Seen, RN Outcome: Completed/Met 06/08/2020 1011 by Orvan Seen, RN Outcome: Progressing   Problem: Activity: Goal: Risk for activity intolerance will decrease 06/08/2020 1138 by Orvan Seen, RN Outcome: Completed/Met 06/08/2020 1011 by Orvan Seen, RN Outcome: Progressing   Problem: Nutrition: Goal: Adequate nutrition will be maintained 06/08/2020 1138 by Orvan Seen, RN Outcome: Completed/Met 06/08/2020 1011 by Orvan Seen, RN Outcome: Progressing   Problem: Coping: Goal: Level of anxiety will decrease 06/08/2020 1138 by Orvan Seen, RN Outcome: Completed/Met 06/08/2020 1011 by Orvan Seen, RN Outcome: Progressing   Problem: Elimination: Goal: Will not experience complications related to bowel motility 06/08/2020 1138 by  Orvan Seen, RN Outcome: Completed/Met 06/08/2020 1011 by Orvan Seen, RN Outcome: Progressing Goal: Will not experience complications related to urinary retention 06/08/2020 1138 by Orvan Seen, RN Outcome: Completed/Met 06/08/2020 1011 by Orvan Seen, RN Outcome: Progressing   Problem: Pain Managment: Goal: General experience of comfort will improve 06/08/2020 1138 by Orvan Seen, RN Outcome: Completed/Met 06/08/2020 1011 by Orvan Seen, RN Outcome: Progressing   Problem: Safety: Goal: Ability to remain free from injury will improve 06/08/2020 1138 by Orvan Seen, RN Outcome: Completed/Met 06/08/2020 1011 by Orvan Seen, RN Outcome: Progressing   Problem: Skin Integrity: Goal: Risk for impaired skin integrity will decrease 06/08/2020 1138 by Orvan Seen, RN Outcome: Completed/Met 06/08/2020 1011 by Orvan Seen, RN Outcome: Progressing

## 2020-06-08 NOTE — Progress Notes (Addendum)
Patient discharged to home with all belongings, ambulated out of unit (per patient's request) with care RN to meet family at main entrance who will drive her home.  A+Ox4.  VSS.  RA.  No complaints of pain.  Medications and discharge instructions reviewed.  All questions answered.  PIV x 1 removed, no bleeding, intact.  Patient agreed to follow up with all appointments as listed on AVS.  Patient satisfied with overall care at [facility].

## 2020-06-08 NOTE — Plan of Care (Signed)

## 2020-07-11 ENCOUNTER — Ambulatory Visit: Payer: Medicare Other | Admitting: Obstetrics & Gynecology

## 2020-08-24 ENCOUNTER — Ambulatory Visit (INDEPENDENT_AMBULATORY_CARE_PROVIDER_SITE_OTHER): Payer: Medicare Other | Admitting: Obstetrics and Gynecology

## 2020-08-24 ENCOUNTER — Encounter: Payer: Self-pay | Admitting: Obstetrics and Gynecology

## 2020-08-24 ENCOUNTER — Other Ambulatory Visit: Payer: Self-pay

## 2020-08-24 VITALS — BP 122/70 | Ht 60.0 in | Wt 119.8 lb

## 2020-08-24 DIAGNOSIS — N993 Prolapse of vaginal vault after hysterectomy: Secondary | ICD-10-CM | POA: Diagnosis not present

## 2020-08-24 DIAGNOSIS — N816 Rectocele: Secondary | ICD-10-CM

## 2020-08-24 NOTE — Progress Notes (Signed)
  HPI:      Ms. JYA HUGHSTON is a 87 y.o. [id number] who presents today for her pessary follow up and examination related to her pelvic floor weakening.  She follows up today for reassessment.  She notes no bleeding or other concerning symptoms. She denies vaginal bleeding, dysuria, hematochezia, abdominal pain.  She had a Ring #2 pessary. She states today that the pessary is out and that she is doing well.  She has no acute complaints. I do not recall removing the pessary and my last note stated that I put the pessary back in. I do not see a not showing that the pessary was removed. However, the patient is adamant that the pessary is out.   PMHx: She  has a past medical history of Heart murmur, History of kidney infection, Hyperlipidemia, Hypertension, LBBB (left bundle branch block), and Syncope and collapse. Also,  has a past surgical history that includes Abdominal hysterectomy., family history includes Hypertension in her mother; Stroke in her mother.,  reports that she has never smoked. She has never used smokeless tobacco. She reports that she does not drink alcohol and does not use drugs.  She has a current medication list which includes the following prescription(s): amlodipine, aspirin, and ezetimibe. Also, is allergic to statins.  Review of Systems  Constitutional: Negative.   HENT: Negative.    Eyes: Negative.   Respiratory: Negative.    Cardiovascular: Negative.   Gastrointestinal: Negative.   Genitourinary: Negative.        Vaginal spotting as per HPI  Musculoskeletal: Negative.   Skin: Negative.   Neurological: Negative.   Psychiatric/Behavioral: Negative.     Objective: BP 122/70   Ht 5' (1.524 m)   Wt 119 lb 12.8 oz (54.3 kg)   BMI 23.40 kg/m  Physical Exam Constitutional:      General: She is not in acute distress.    Appearance: Normal appearance.  HENT:     Head: Normocephalic and atraumatic.  Eyes:     General: No scleral icterus.    Conjunctiva/sclera:  Conjunctivae normal.  Neurological:     General: No focal deficit present.     Mental Status: She is alert and oriented to person, place, and time.     Cranial Nerves: No cranial nerve deficit.  Psychiatric:        Mood and Affect: Mood normal.        Behavior: Behavior normal.        Judgment: Judgment normal.    Pessary Care N/a  A/P:  Instructions given for care. Concerning symptoms to observe for are counseled to patient. She is to follow up if she has problems.    She is using for topical vaseline for irritation, as needed.   A total of 13 minutes were spent face-to-face with the patient as well as preparation, review, communication, and documentation during this encounter.   Thomasene Mohair, MD  [facility], [facility] 08/24/2020  5:30 PM

## 2021-07-12 ENCOUNTER — Other Ambulatory Visit: Payer: Self-pay | Admitting: Nurse Practitioner

## 2021-07-12 DIAGNOSIS — N1832 Chronic kidney disease, stage 3b: Secondary | ICD-10-CM

## 2021-07-12 DIAGNOSIS — I10 Essential (primary) hypertension: Secondary | ICD-10-CM

## 2021-07-21 ENCOUNTER — Other Ambulatory Visit: Payer: Medicare Other

## 2021-07-26 ENCOUNTER — Ambulatory Visit
Admission: RE | Admit: 2021-07-26 | Discharge: 2021-07-26 | Disposition: A | Discharge status: Home / Self Care | Payer: Medicare Other | Source: Ambulatory Visit | Attending: Nurse Practitioner | Admitting: Nurse Practitioner

## 2021-07-26 DIAGNOSIS — I10 Essential (primary) hypertension: Secondary | ICD-10-CM | POA: Diagnosis present

## 2021-07-26 DIAGNOSIS — N1832 Chronic kidney disease, stage 3b: Secondary | ICD-10-CM | POA: Insufficient documentation

## 2021-07-31 ENCOUNTER — Other Ambulatory Visit: Payer: Self-pay

## 2021-07-31 ENCOUNTER — Emergency Department
Admission: EM | Admit: 2021-07-31 | Discharge: 2021-08-01 | Disposition: A | Discharge status: Home / Self Care | Payer: Medicare Other | Attending: Emergency Medicine | Admitting: Emergency Medicine

## 2021-07-31 ENCOUNTER — Emergency Department: Payer: Medicare Other

## 2021-07-31 ENCOUNTER — Encounter: Payer: Self-pay | Admitting: *Deleted

## 2021-07-31 DIAGNOSIS — R000 Tachycardia, unspecified: Secondary | ICD-10-CM | POA: Insufficient documentation

## 2021-07-31 DIAGNOSIS — I471 Supraventricular tachycardia, unspecified: Secondary | ICD-10-CM

## 2021-07-31 DIAGNOSIS — R002 Palpitations: Secondary | ICD-10-CM

## 2021-07-31 DIAGNOSIS — I10 Essential (primary) hypertension: Secondary | ICD-10-CM | POA: Insufficient documentation

## 2021-07-31 DIAGNOSIS — R7989 Other specified abnormal findings of blood chemistry: Secondary | ICD-10-CM

## 2021-07-31 DIAGNOSIS — R0602 Shortness of breath: Secondary | ICD-10-CM | POA: Diagnosis present

## 2021-07-31 DIAGNOSIS — K8020 Calculus of gallbladder without cholecystitis without obstruction: Secondary | ICD-10-CM

## 2021-07-31 DIAGNOSIS — R55 Syncope and collapse: Secondary | ICD-10-CM

## 2021-07-31 LAB — COMPREHENSIVE METABOLIC PANEL
ALT: 64 U/L — ABNORMAL HIGH (ref 0–44)
AST: 115 U/L — ABNORMAL HIGH (ref 15–41)
Albumin: 4.2 g/dL (ref 3.5–5.0)
Alkaline Phosphatase: 69 U/L (ref 38–126)
Anion gap: 7 (ref 5–15)
BUN: 15 mg/dL (ref 8–23)
CO2: 25 mmol/L (ref 22–32)
Calcium: 9.3 mg/dL (ref 8.9–10.3)
Chloride: 104 mmol/L (ref 98–111)
Creatinine, Ser: 0.88 mg/dL (ref 0.44–1.00)
GFR, Estimated: >60 mL/min (ref >60)
Glucose, Bld: 103 mg/dL — ABNORMAL HIGH (ref 70–99)
Potassium: 3.5 mmol/L (ref 3.5–5.1)
Sodium: 136 mmol/L (ref 135–145)
Total Bilirubin: 0.5 mg/dL (ref 0.3–1.2)
Total Protein: 7.9 g/dL (ref 6.5–8.1)

## 2021-07-31 LAB — CBC
HCT: 37.7 % (ref 36.0–46.0)
Hemoglobin: 12.2 g/dL (ref 12.0–15.0)
MCH: 28 pg (ref 26.0–34.0)
MCHC: 32.4 g/dL (ref 30.0–36.0)
MCV: 86.7 fL (ref 80.0–100.0)
Platelets: 257 10*3/uL (ref 150–400)
RBC: 4.35 MIL/uL (ref 3.87–5.11)
RDW: 14.5 % (ref 11.5–15.5)
WBC: 6.5 10*3/uL (ref 4.0–10.5)
nRBC: 0 % (ref 0.0–0.2)

## 2021-07-31 LAB — TROPONIN I (HIGH SENSITIVITY)
Troponin I (High Sensitivity): 4 ng/L (ref <18)
Troponin I (High Sensitivity): 4 ng/L (ref <18)

## 2021-07-31 MED ORDER — SODIUM CHLORIDE 0.9 % IV BOLUS
500.0000 mL | Freq: Once | INTRAVENOUS | Status: AC
  Administered 2021-07-31: 500 mL via INTRAVENOUS

## 2021-07-31 MED ORDER — DILTIAZEM HCL 25 MG/5ML IV SOLN
10.0000 mg | Freq: Once | INTRAVENOUS | Status: AC
  Administered 2021-07-31: 10 mg via INTRAVENOUS
  Filled 2021-07-31: qty 5

## 2021-07-31 NOTE — Discharge Instructions (Addendum)
You have been seen in the emergency department for a fast heart rate.  This has corrected in the emergency department.  Please call the number provided for cardiology to arrange a follow-up appointment for recheck/reevaluation and consideration of a Holter monitor.  Return to the emergency department for any further palpitations/rapid heart rate/shortness of breath or any other symptom personally concerning to yourself.  Your liver function tests were slightly elevated with your AST of 115 and your ALT of 64.  I recommend avoiding Tylenol and alcohol.  Your other liver function test and lipase (enzyme from your pancreas) were normal today.  Your ultrasound showed gallstones but no signs of inflammation or infection.  If you begin having right upper abdominal pain, yellowing of your skin or eyes, vomiting, fever of 100.4 or higher, please return the emergency department.  I recommend low-fat diet and close follow-up with your primary care doctor to follow your liver function tests.

## 2021-07-31 NOTE — ED Triage Notes (Signed)
Pt ambulating to triage.  Pt states she feels like her blood pressure is elevated.  Pt feels dizzy and has sob.  No chest pain.  Sx began today .  Pt alert speech clear.

## 2021-07-31 NOTE — ED Provider Notes (Signed)
11:15 PM  Assumed care at shift change.  Patient here with episode of likely SVT with shortness of breath.  Symptoms resolved after diltiazem and now is in a normal sinus rhythm.  Has cardiology follow-up.  Labs showed mild LFT elevation so right upper quadrant ultrasound was ordered.  Second troponin also pending.  1:12 AM  Pt's second troponin is negative.  Right upper quadrant ultrasound reviewed/interpreted by myself radiologist's shows gallstones but no obstruction or cholecystitis.  She has no fever, abdominal pain, vomiting.  Lipase normal.  Recommend low-fat diet, avoiding Tylenol and alcohol.  Added on magnesium and TSH for her episode of SVT, palpitations and these were normal.  Have recommended close follow-up with her cardiologist.  She remains hemodynamically stable here without complaints.  Patient and daughter comfortable with plan for discharge home.  At this time, I do not feel there is any life-threatening condition present. I reviewed all nursing notes, vitals, pertinent previous records.  All lab and urine results, EKGs, imaging ordered have been independently reviewed and interpreted by myself.  I reviewed all available radiology reports from any imaging ordered this visit.  Based on my assessment, I feel the patient is safe to be discharged home without further emergent workup and can continue workup as an outpatient as needed. Discussed all findings, treatment plan as well as usual and customary return precautions.  They verbalize understanding and are comfortable with this plan.  Outpatient follow-up has been provided as needed.  All questions have been answered.    Jawaun Celmer, Layla Maw, DO 08/01/21 [id number]

## 2021-07-31 NOTE — ED Provider Notes (Signed)
[facility] Provider Note    Event Date/Time   First MD Initiated Contact with Patient 07/31/21 2119     (approximate)  History   Chief Complaint: Hypertension  HPI  LEEAN Saunders is a 88 y.o. female with a past medical history of hypertension, hyperlipidemia, presents to the emergency department for rapid heart rate and shortness of breath.  According to the patient since this morning she has been feeling short of breath and somewhat weak.  States she has been feeling her heart racing in her chest.  Upon arrival patient is heart rate around 140 bpm blood pressure currently 105 systolic during my evaluation.  Patient states she has been feeling somewhat short of breath but denies any cough or congestion.  No fever.  No pain in the chest or abdomen.  No recent nausea vomiting or diarrhea.  No leg pain or swelling.  No history of irregular heartbeat or arrhythmia.  Physical Exam   Triage Vital Signs: ED Triage Vitals  Enc Vitals Group     BP 07/31/21 2120 (!) 80/50     Pulse Rate 07/31/21 2120 (!) 155     Resp 07/31/21 2106 20     Temp 07/31/21 2106 98.6 F (37 C)     Temp Source 07/31/21 2106 Oral     SpO2 07/31/21 2120 90 %     Weight 07/31/21 2122 119 lb 0.8 oz (54 kg)     Height 07/31/21 2122 5' (1.524 m)     Head Circumference --      Peak Flow --      Pain Score 07/31/21 2121 0     Pain Loc --      Pain Edu? --      Excl. in GC? --     Most recent vital signs: Vitals:   07/31/21 2106 07/31/21 2120  BP:  (!) 80/50  Pulse:  (!) 155  Resp: 20 20  Temp: 98.6 F (37 C) 98.6 F (37 C)  SpO2:  90%    General: Awake, no distress.  CV:  Good peripheral perfusion.  Regular rhythm rate around 140 bpm. Resp:  Normal effort.  Equal breath sounds bilaterally.  Abd:  No distention.  Soft, nontender.  No rebound or guarding. Other:  No lower extremity edema or tenderness.   ED Results / Procedures / Treatments   EKG  EKG viewed and interpreted  by myself shows what appears to be supraventricular tachycardia versus sinus tachycardia at 145 bpm there is a slightly widened QRS, normal axis, normal intervals nonspecific ST changes slight ST depression in the lateral leads likely consistent with demand ischemia.  RADIOLOGY  I have viewed and interpreted the chest x-ray images I do not see any acute abnormality on my evaluation. Radiology is read the chest x-ray is negative   MEDICATIONS ORDERED IN ED: Medications  diltiazem (CARDIZEM) injection 10 mg (has no administration in time range)  sodium chloride 0.9 % bolus 500 mL (has no administration in time range)     IMPRESSION / MDM / ASSESSMENT AND PLAN / ED COURSE  I reviewed the triage vital signs and the nursing notes.  Patient's presentation is most consistent with acute presentation with potential threat to life or bodily function.  Patient presents emergency department for shortness of breath and rapid heart rate.  Found to be tachycardic around 145 bpm.  Patient was initially noted to be hypotensive in triage currently 105 systolic during my evaluation without intervention.  We will dose IV fluids.  We will dose 10 mg of IV diltiazem.  Is difficult to decipher the patient's rhythm could be a sinus tachycardia versus SVT first possible atrial flutter with 2-1 block does not appear to be irregular.  We will check labs including cardiac enzymes we will obtain a portable chest x-ray and continue to closely monitor.  Patient agreeable to plan of care.  Daughter is here and agreeable as well.  Patient's work-up is reassuring.  Negative troponin.  Normal CBC and a normal chemistry besides mild LFT elevation.  We will obtain a right upper quadrant ultrasound to further evaluate although reassuringly patient has no abdominal tenderness.  We will repeat a troponin.  Initial troponin is reassuring.  Patient has converted back to a normal sinus rhythm after receiving diltiazem.  Currently has a  normal sinus rhythm around 65 bpm.  Patient states her symptoms have gone away she no longer feels palpitations or short of breath.  We will repeat a troponin of the patient's repeat troponin is negative and ultrasound does not show any concerning findings in the liver/gallbladder believe the patient could be discharged home with cardiology follow-up.  Daughter states the patient follows up with Dr. Kirke Corin.  I discussed strict return precautions.  Patient care signed out to oncoming provider  FINAL CLINICAL IMPRESSION(S) / ED DIAGNOSES   Dyspnea Tachycardia   Note:  This document was prepared using Dragon voice recognition software and may include unintentional dictation errors.   Minna Antis, MD 07/31/21 2249

## 2021-08-01 LAB — TSH: TSH: 2.679 u[IU]/mL (ref 0.350–4.500)

## 2021-08-01 LAB — MAGNESIUM: Magnesium: 2.2 mg/dL (ref 1.7–2.4)

## 2021-08-01 LAB — LIPASE, BLOOD: Lipase: 43 U/L (ref 11–51)

## 2021-08-07 ENCOUNTER — Emergency Department: Payer: Medicare Other

## 2021-08-07 ENCOUNTER — Emergency Department
Admission: EM | Admit: 2021-08-07 | Discharge: 2021-08-08 | Disposition: A | Discharge status: Home / Self Care | Payer: Medicare Other | Attending: Emergency Medicine | Admitting: Emergency Medicine

## 2021-08-07 DIAGNOSIS — R002 Palpitations: Secondary | ICD-10-CM

## 2021-08-07 DIAGNOSIS — R000 Tachycardia, unspecified: Secondary | ICD-10-CM | POA: Diagnosis present

## 2021-08-07 DIAGNOSIS — R1011 Right upper quadrant pain: Secondary | ICD-10-CM

## 2021-08-07 DIAGNOSIS — R0602 Shortness of breath: Secondary | ICD-10-CM | POA: Insufficient documentation

## 2021-08-07 DIAGNOSIS — I471 Supraventricular tachycardia: Secondary | ICD-10-CM | POA: Diagnosis not present

## 2021-08-07 DIAGNOSIS — K8050 Calculus of bile duct without cholangitis or cholecystitis without obstruction: Secondary | ICD-10-CM

## 2021-08-07 LAB — COMPREHENSIVE METABOLIC PANEL
ALT: 22 U/L (ref 0–44)
AST: 29 U/L (ref 15–41)
Albumin: 3.9 g/dL (ref 3.5–5.0)
Alkaline Phosphatase: 56 U/L (ref 38–126)
Anion gap: 11 (ref 5–15)
BUN: 14 mg/dL (ref 8–23)
CO2: 23 mmol/L (ref 22–32)
Calcium: 9.2 mg/dL (ref 8.9–10.3)
Chloride: 102 mmol/L (ref 98–111)
Creatinine, Ser: 1.08 mg/dL — ABNORMAL HIGH (ref 0.44–1.00)
GFR, Estimated: 48 mL/min — ABNORMAL LOW (ref >60)
Glucose, Bld: 104 mg/dL — ABNORMAL HIGH (ref 70–99)
Potassium: 3.6 mmol/L (ref 3.5–5.1)
Sodium: 136 mmol/L (ref 135–145)
Total Bilirubin: 0.8 mg/dL (ref 0.3–1.2)
Total Protein: 7.5 g/dL (ref 6.5–8.1)

## 2021-08-07 LAB — CBC WITH DIFFERENTIAL/PLATELET
Abs Immature Granulocytes: 0.05 10*3/uL (ref 0.00–0.07)
Basophils Absolute: 0.1 10*3/uL (ref 0.0–0.1)
Basophils Relative: 1 %
Eosinophils Absolute: 0 10*3/uL (ref 0.0–0.5)
Eosinophils Relative: 1 %
HCT: 36.6 % (ref 36.0–46.0)
Hemoglobin: 12 g/dL (ref 12.0–15.0)
Immature Granulocytes: 1 %
Lymphocytes Relative: 48 %
Lymphs Abs: 3.2 10*3/uL (ref 0.7–4.0)
MCH: 28.8 pg (ref 26.0–34.0)
MCHC: 32.8 g/dL (ref 30.0–36.0)
MCV: 87.8 fL (ref 80.0–100.0)
Monocytes Absolute: 0.7 10*3/uL (ref 0.1–1.0)
Monocytes Relative: 11 %
Neutro Abs: 2.4 10*3/uL (ref 1.7–7.7)
Neutrophils Relative %: 38 %
Platelets: 246 10*3/uL (ref 150–400)
RBC: 4.17 MIL/uL (ref 3.87–5.11)
RDW: 14.6 % (ref 11.5–15.5)
WBC: 6.4 10*3/uL (ref 4.0–10.5)
nRBC: 0 % (ref 0.0–0.2)

## 2021-08-07 LAB — MAGNESIUM: Magnesium: 2.1 mg/dL (ref 1.7–2.4)

## 2021-08-07 LAB — TROPONIN I (HIGH SENSITIVITY): Troponin I (High Sensitivity): 4 ng/L (ref <18)

## 2021-08-07 MED ORDER — METOPROLOL TARTRATE 25 MG PO TABS
25.0000 mg | ORAL_TABLET | Freq: Once | ORAL | Status: AC
  Administered 2021-08-07: 25 mg via ORAL

## 2021-08-07 MED ORDER — METOPROLOL TARTRATE 5 MG/5ML IV SOLN
5.0000 mg | Freq: Once | INTRAVENOUS | Status: AC
Start: 2021-08-07 — End: 2021-08-07
  Administered 2021-08-07: 5 mg via INTRAVENOUS

## 2021-08-07 MED ORDER — SODIUM CHLORIDE 0.9 % IV BOLUS
1000.0000 mL | Freq: Once | INTRAVENOUS | Status: AC
  Administered 2021-08-07: 1000 mL via INTRAVENOUS

## 2021-08-07 NOTE — ED Triage Notes (Signed)
Pt comes from home via [facility] c/o fast heart rate, palpitations and SOB. Per EMS, pt was in SVT, they did some vagal maneuvers and heart rate went down to 130s. 325mg  baby aspirin given by EMS. Pt states she was feeling palpitations, sob, and right sided flank pain around 3 this afternoon. Pt complaining of "feeling tired". Hx of HTN and dementia.

## 2021-08-07 NOTE — ED Provider Notes (Signed)
   [facility] Provider Note    Event Date/Time   First MD Initiated Contact with Patient 08/07/21 2257     (approximate)   History   Tachycardia   HPI  Samantha Saunders is a 88 y.o. female who presents to the ED for evaluation of Tachycardia   Review similar ED visit from 7 days ago where patient was evaluated for palpitations and likely SVT, symptomatic with shortness of breath. Normal TSH.  Given small dose of IV diltiazem, return to a sinus rhythm and ultimately discharged home with cardiology follow-up.    Physical Exam   Triage Vital Signs: ED Triage Vitals  Enc Vitals Group     BP 08/07/21 2243 100/82     Pulse Rate 08/07/21 2243 (!) 140     Resp 08/07/21 2243 20     Temp 08/07/21 2243 98.2 F (36.8 C)     Temp Source 08/07/21 2243 Oral     SpO2 08/07/21 2243 100 %     Weight 08/07/21 2245 120 lb 9.5 oz (54.7 kg)     Height 08/07/21 2245 5' (1.524 m)     Head Circumference --      Peak Flow --      Pain Score 08/07/21 2244 0     Pain Loc --      Pain Edu? --      Excl. in GC? --     Most recent vital signs: Vitals:   08/07/21 2243  BP: 100/82  Pulse: (!) 140  Resp: 20  Temp: 98.2 F (36.8 C)  SpO2: 100%    General: Awake, no distress. *** CV:  Good peripheral perfusion.  Resp:  Normal effort.  Abd:  No distention.  MSK:  No deformity noted.  Neuro:  No focal deficits appreciated. Other:     ED Results / Procedures / Treatments   Labs (all labs ordered are listed, but only abnormal results are displayed) Labs Reviewed - No data to display  EKG ***  RADIOLOGY ***  Official radiology report(s): No results found.  PROCEDURES and INTERVENTIONS:  Procedures  Medications  sodium chloride 0.9 % bolus 1,000 mL (1,000 mLs Intravenous New Bag/Given 08/07/21 2253)     IMPRESSION / MDM / ASSESSMENT AND PLAN / ED COURSE  I reviewed the triage vital signs and the nursing notes.  Differential diagnosis includes, but  is not limited to, ***  {Patient presents with symptoms of an acute illness or injury that is potentially life-threatening.}      FINAL CLINICAL IMPRESSION(S) / ED DIAGNOSES   Final diagnoses:  None     Rx / DC Orders   ED Discharge Orders     None        Note:  This document was prepared using Dragon voice recognition software and may include unintentional dictation errors.

## 2021-08-08 DIAGNOSIS — I471 Supraventricular tachycardia: Secondary | ICD-10-CM | POA: Diagnosis not present

## 2021-08-08 LAB — URINALYSIS, ROUTINE W REFLEX MICROSCOPIC
Bilirubin Urine: NEGATIVE
Glucose, UA: NEGATIVE mg/dL
Ketones, ur: NEGATIVE mg/dL
Nitrite: NEGATIVE
Protein, ur: NEGATIVE mg/dL
Specific Gravity, Urine: 1.001 — ABNORMAL LOW (ref 1.005–1.030)
WBC, UA: >50 WBC/hpf — ABNORMAL HIGH (ref 0–5)
pH: 7 (ref 5.0–8.0)

## 2021-08-08 LAB — BRAIN NATRIURETIC PEPTIDE: B Natriuretic Peptide: 64.3 pg/mL (ref 0.0–100.0)

## 2021-08-08 MED ORDER — METOPROLOL TARTRATE 25 MG PO TABS: 25.0000 mg | ORAL_TABLET | Freq: Two times a day (BID) | ORAL | 1 refills | Status: DC

## 2021-08-08 NOTE — Discharge Instructions (Addendum)
You will be discharged with a new medication called metoprolol 25mg  to take twice daily every day in addition to your typical medications.  This medication will help slow your heart rate and prevent any further episodes of rapid heart rates (SVT).  Please follow-up with your PCP later this morning as scheduled.  Follow-up with Dr. in 2 weeks, as scheduled, and continue this medication until then.  If you have any further episodes of palpitations, chest pain, passing out or other worsening then please return to the ED.  Regarding your gallstones, please reach out to the clinic of Dr. Kirke Corin, to discuss possibly having your gallbladder removed electively. If you have severe or persistent abdominal/flank pain, fevers, or other worsening symptoms then please return to the ED.

## 2021-08-09 LAB — URINE CULTURE

## 2021-08-17 ENCOUNTER — Ambulatory Visit: Payer: Medicare Other | Admitting: Interventional Cardiology

## 2021-08-29 ENCOUNTER — Other Ambulatory Visit: Payer: Self-pay | Admitting: Internal Medicine

## 2021-08-29 DIAGNOSIS — N200 Calculus of kidney: Secondary | ICD-10-CM

## 2021-09-04 ENCOUNTER — Ambulatory Visit
Admission: RE | Admit: 2021-09-04 | Discharge: 2021-09-04 | Disposition: A | Discharge status: Home / Self Care | Payer: Medicare Other | Source: Ambulatory Visit | Attending: Internal Medicine | Admitting: Internal Medicine

## 2021-09-04 DIAGNOSIS — N200 Calculus of kidney: Secondary | ICD-10-CM | POA: Diagnosis present

## 2021-09-26 ENCOUNTER — Ambulatory Visit: Payer: Medicare Other | Admitting: Urology

## 2021-09-26 ENCOUNTER — Ambulatory Visit
Admission: RE | Admit: 2021-09-26 | Discharge: 2021-09-26 | Disposition: A | Discharge status: Home / Self Care | Payer: Medicare Other | Source: Ambulatory Visit | Attending: Urology | Admitting: Urology

## 2021-09-26 ENCOUNTER — Ambulatory Visit
Admission: RE | Admit: 2021-09-26 | Discharge: 2021-09-26 | Disposition: A | Discharge status: Home / Self Care | Payer: Medicare Other | Attending: Urology | Admitting: Urology

## 2021-09-26 ENCOUNTER — Encounter: Payer: Self-pay | Admitting: Urology

## 2021-09-26 ENCOUNTER — Telehealth: Payer: Self-pay

## 2021-09-26 VITALS — BP 135/74 | HR 81 | Ht 60.0 in | Wt 111.0 lb

## 2021-09-26 DIAGNOSIS — N362 Urethral caruncle: Secondary | ICD-10-CM

## 2021-09-26 DIAGNOSIS — N200 Calculus of kidney: Secondary | ICD-10-CM

## 2021-09-26 DIAGNOSIS — N993 Prolapse of vaginal vault after hysterectomy: Secondary | ICD-10-CM

## 2021-09-26 LAB — URINALYSIS, COMPLETE
Bilirubin, UA: NEGATIVE
Glucose, UA: NEGATIVE
Ketones, UA: NEGATIVE
Leukocytes,UA: NEGATIVE
Nitrite, UA: NEGATIVE
Protein,UA: NEGATIVE
Specific Gravity, UA: 1.01 (ref 1.005–1.030)
Urobilinogen, Ur: 0.2 mg/dL (ref 0.2–1.0)
pH, UA: 6 (ref 5.0–7.5)

## 2021-09-26 LAB — MICROSCOPIC EXAMINATION: Bacteria, UA: NONE SEEN

## 2021-09-26 NOTE — Telephone Encounter (Signed)
Informed patients daughter of results and recommendations.

## 2021-09-26 NOTE — Progress Notes (Signed)
09/26/2021 2:40 PM   KADE RICKELS [birth year] [id number]  Referring provider: Cedric Fishman, MD [address] [place],  [place] [address]  Chief Complaint  Patient presents with   Nephrolithiasis    HPI: 88 year old female who presents today for further evaluation of incidental right renal stone.  Ms. Colson is a 88 year old woman who presents today accompanied by her daughter.  She does have a personal history of dementia.  She was seen in the emergency room several times in the last several months with palpitations, chest pain shortness of breath, etc.  A part of this work-up, she did have a renal ultrasound as part of the evaluation.  She had a renal ultrasound in July indicating the possibility of a 4.8 mm right renal stone without hydronephrosis.  She was started on Flomax by her PCP with a plan for follow-up renal ultrasound a month later.  This was repeated on 09/05/2021 and noted to be 5.8 mm on this particular study again without hydronephrosis.  She was referred to urology for enlarging stone and "failed medical management".  Much of the history today is provided by the patient's daughter.  She reports that she has been having some intermittent pain, primarily in her right lower quadrant which comes and goes.  She denies any overt right flank pain.  No dysuria or any other urinary symptoms.  In addition to the above, they have had episodes over the past several weeks where she will wipe and see blood on the tissue.  She does not know if it is coming from the vagina or the bladder.  She does have a personal history of bladder prolapse and used to have a pessary which was removed several months ago.   PMH: Past Medical History:  Diagnosis Date   Heart murmur    History of kidney infection    Hyperlipidemia    Hypertension    LBBB (left bundle branch block)    Syncope and collapse     Surgical History: Past Surgical History:  Procedure Laterality Date    ABDOMINAL HYSTERECTOMY      Home Medications:  Allergies as of 09/26/2021       Reactions   Statins Hives        Medication List        Accurate as of September 26, 2021  2:40 PM. If you have any questions, ask your nurse or doctor.          amLODipine 10 MG tablet Commonly known as: NORVASC Take 1 tablet (10 mg total) by mouth daily.   aspirin 81 MG tablet Take 81 mg by mouth daily.   ezetimibe 10 MG tablet Commonly known as: ZETIA Take 10 mg by mouth daily.   lisinopril 2.5 MG tablet Commonly known as: ZESTRIL Take 2.5 mg by mouth daily.   metoprolol tartrate 25 MG tablet Commonly known as: LOPRESSOR Take 1 tablet (25 mg total) by mouth 2 (two) times daily.   tamsulosin 0.4 MG Caps capsule Commonly known as: FLOMAX Take 0.4 mg by mouth.        Allergies:  Allergies  Allergen Reactions   Statins Hives    Family History: Family History  Problem Relation Age of Onset   Stroke Mother    Hypertension Mother     Social History:  reports that she has never smoked. She has never used smokeless tobacco. She reports that she does not drink alcohol and does not use drugs.   Physical  Exam: BP 135/74   Pulse 81   Ht 5' (1.524 m)   Wt 111 lb (50.3 kg)   BMI 21.68 kg/m   Constitutional:  Alert and oriented, No acute distress. HEENT: [place] AT, moist mucus membranes.  Trachea midline, no masses. GU: Normal external genitalia.  Accompanied by Darrol Angel, the urethra was prepped using Betadine.  Notably, she had some urethral atrophy, vaginal prolapse appreciated.  There was a small urethral carbuncle at the 12 o'clock position.  The urethra was then intubated using a red rubber catheter with drainage of clear yellow urine. Neurologic: Grossly intact, no focal deficits, moving all 4 extremities. Psychiatric: Normal mood and affect.  Laboratory Data: Lab Results  Component Value Date   WBC 6.4 08/07/2021   HGB 12.0 08/07/2021   HCT 36.6 08/07/2021    MCV 87.8 08/07/2021   PLT 246 08/07/2021    Lab Results  Component Value Date   CREATININE 1.08 (H) 08/07/2021     Urinalysis UA today is negative   Pertinent Imaging:  US RENAL  Narrative CLINICAL DATA:  Right renal stone  EXAM: RENAL / URINARY TRACT ULTRASOUND COMPLETE  COMPARISON:  None Available.  FINDINGS: Right Kidney:  Renal measurements: 9 x 3.8 x 4.9 cm = volume: 89 mL. Contains a 5.8 mm nonobstructive stone.  Left Kidney:  Renal measurements: 9.8 x 5.2 x 3.7 cm = volume: 99 mL. Echogenicity within normal limits. No mass or hydronephrosis visualized.  Bladder:  Appears normal for degree of bladder distention.  Other:  None.  IMPRESSION: There is a 5.8 mm nonobstructive stone in the right kidney. No other abnormalities are identified.   Electronically Signed By: Gerome Sam III M.D. On: 09/05/2021 11:52   IMPRESSION: Bilateral nonobstructing renal calculi.  Diverticulosis without diverticulitis.  Degenerative change of the lumbar spine as described.   Electronically Signed By: Alcide Clever M.D. On: 06/06/2020 15:07   Renal ultrasound as well as CT scan was personally reviewed.  Assessment & Plan:    1. Stone in kidney Patient has a known history of bilateral obstructing stones, seen best in the most recent noncontrast CT scan from 05/2020 with bilateral small stones, largest of which is 6 mm on the right  This is also appreciated on renal ultrasound  We discussed that nonobstructing stones do not tend to cause any flank pain or issues.  Is not to the passing to the ureter and they become obstructive and problematic.  There is no evidence of passing a stone at this time.  We also discussed the size discrepancy related to technique when it comes to renal ultrasound.  We will plan to get a KUB today to reassess the size and location of her stones to begin follow-up in that way.  Given the stones are likely relatively stable  nonobstructing, I recommended observation at her age and with her medical comorbidities.  I also recommended discontinuing the Flomax.  This serves no purpose at this time and could exacerbate some of her fatigue dizziness, etc. - Abdomen 1 view (KUB); Future - Urinalysis, Complete  2. Urethral caruncle Incidental, may be source of blood with wiping  Catheterized urine specimen today indicates that she has no microscopic or gross blood, would not recommend any further evaluation at this time  3. Prolapse of vaginal vault after hysterectomy As above   Follow-up as needed  Vanna Scotland, MD  [facility] [address] [place], [place] [address] [phone]

## 2021-09-26 NOTE — Telephone Encounter (Signed)
-----   Message from Vanna Scotland, MD sent at 09/26/2021  2:40 PM EDT ----- This patient's catheterized urinalysis was completely negative, no blood.  This is good news.  Also I personally reviewed her KUB x-ray today and I do not see the stone in question.  Either it is too small or hidden.  I would recommend just conservative management, no further intervention for this nonobstructing stone.  Vanna Scotland, MD

## 2021-11-16 ENCOUNTER — Emergency Department: Payer: Medicare Other

## 2021-11-16 ENCOUNTER — Other Ambulatory Visit: Payer: Self-pay

## 2021-11-16 ENCOUNTER — Ambulatory Visit: Payer: Medicare Other | Admitting: Obstetrics and Gynecology

## 2021-11-16 ENCOUNTER — Observation Stay
Admission: EM | Admit: 2021-11-16 | Discharge: 2021-11-17 | Disposition: A | Discharge status: Home / Self Care | Payer: Medicare Other | Attending: Osteopathic Medicine | Admitting: Osteopathic Medicine

## 2021-11-16 DIAGNOSIS — R531 Weakness: Secondary | ICD-10-CM | POA: Diagnosis present

## 2021-11-16 DIAGNOSIS — I10 Essential (primary) hypertension: Secondary | ICD-10-CM | POA: Insufficient documentation

## 2021-11-16 DIAGNOSIS — Z9181 History of falling: Secondary | ICD-10-CM | POA: Diagnosis not present

## 2021-11-16 DIAGNOSIS — I4892 Unspecified atrial flutter: Secondary | ICD-10-CM | POA: Diagnosis not present

## 2021-11-16 DIAGNOSIS — R6510 Systemic inflammatory response syndrome (SIRS) of non-infectious origin without acute organ dysfunction: Secondary | ICD-10-CM

## 2021-11-16 DIAGNOSIS — M549 Dorsalgia, unspecified: Secondary | ICD-10-CM | POA: Insufficient documentation

## 2021-11-16 DIAGNOSIS — Z79899 Other long term (current) drug therapy: Secondary | ICD-10-CM | POA: Insufficient documentation

## 2021-11-16 DIAGNOSIS — Z23 Encounter for immunization: Secondary | ICD-10-CM | POA: Diagnosis not present

## 2021-11-16 DIAGNOSIS — F0390 Unspecified dementia without behavioral disturbance: Secondary | ICD-10-CM | POA: Insufficient documentation

## 2021-11-16 DIAGNOSIS — E785 Hyperlipidemia, unspecified: Secondary | ICD-10-CM | POA: Diagnosis present

## 2021-11-16 DIAGNOSIS — W1839XA Other fall on same level, initial encounter: Secondary | ICD-10-CM | POA: Diagnosis not present

## 2021-11-16 DIAGNOSIS — Z7982 Long term (current) use of aspirin: Secondary | ICD-10-CM | POA: Insufficient documentation

## 2021-11-16 DIAGNOSIS — R2689 Other abnormalities of gait and mobility: Secondary | ICD-10-CM | POA: Diagnosis not present

## 2021-11-16 DIAGNOSIS — N950 Postmenopausal bleeding: Secondary | ICD-10-CM

## 2021-11-16 LAB — CBC
HCT: 41.1 % (ref 36.0–46.0)
Hemoglobin: 13.2 g/dL (ref 12.0–15.0)
MCH: 28.1 pg (ref 26.0–34.0)
MCHC: 32.1 g/dL (ref 30.0–36.0)
MCV: 87.6 fL (ref 80.0–100.0)
Platelets: 276 10*3/uL (ref 150–400)
RBC: 4.69 MIL/uL (ref 3.87–5.11)
RDW: 13.3 % (ref 11.5–15.5)
WBC: 7.5 10*3/uL (ref 4.0–10.5)
nRBC: 0 % (ref 0.0–0.2)

## 2021-11-16 LAB — LACTIC ACID, PLASMA
Lactic Acid, Venous: 3.3 mmol/L (ref 0.5–1.9)
Lactic Acid, Venous: 2.9 mmol/L (ref 0.5–1.9)

## 2021-11-16 LAB — BASIC METABOLIC PANEL
Anion gap: 11 (ref 5–15)
BUN: 11 mg/dL (ref 8–23)
CO2: 25 mmol/L (ref 22–32)
Calcium: 9.3 mg/dL (ref 8.9–10.3)
Chloride: 100 mmol/L (ref 98–111)
Creatinine, Ser: 1.11 mg/dL — ABNORMAL HIGH (ref 0.44–1.00)
GFR, Estimated: 46 mL/min — ABNORMAL LOW (ref >60)
Glucose, Bld: 178 mg/dL — ABNORMAL HIGH (ref 70–99)
Potassium: 3.6 mmol/L (ref 3.5–5.1)
Sodium: 136 mmol/L (ref 135–145)

## 2021-11-16 LAB — PROCALCITONIN: Procalcitonin: <0.1 ng/mL

## 2021-11-16 LAB — URINALYSIS, ROUTINE W REFLEX MICROSCOPIC
Bilirubin Urine: NEGATIVE
Glucose, UA: NEGATIVE mg/dL
Ketones, ur: NEGATIVE mg/dL
Nitrite: NEGATIVE
Protein, ur: NEGATIVE mg/dL
Specific Gravity, Urine: 1.01 (ref 1.005–1.030)
pH: 8 (ref 5.0–8.0)

## 2021-11-16 LAB — TROPONIN I (HIGH SENSITIVITY)
Troponin I (High Sensitivity): 9 ng/L (ref <18)
Troponin I (High Sensitivity): 8 ng/L (ref <18)

## 2021-11-16 LAB — VITAMIN B12: Vitamin B-12: 864 pg/mL (ref 180–914)

## 2021-11-16 MED ORDER — SODIUM CHLORIDE 0.9 % IV BOLUS
1000.0000 mL | Freq: Once | INTRAVENOUS | Status: AC
  Administered 2021-11-16: 1000 mL via INTRAVENOUS

## 2021-11-16 MED ORDER — ACETAMINOPHEN 325 MG PO TABS: 650.0000 mg | ORAL_TABLET | Freq: Four times a day (QID) | ORAL | Status: DC | PRN

## 2021-11-16 MED ORDER — METOPROLOL TARTRATE 5 MG/5ML IV SOLN
5.0000 mg | INTRAVENOUS | Status: DC | PRN
  Administered 2021-11-16: 5 mg via INTRAVENOUS
  Filled 2021-11-16: qty 5

## 2021-11-16 MED ORDER — ONDANSETRON HCL 4 MG PO TABS: 4.0000 mg | ORAL_TABLET | Freq: Four times a day (QID) | ORAL | Status: DC | PRN

## 2021-11-16 MED ORDER — SODIUM CHLORIDE 0.9 % IV SOLN: INTRAVENOUS | Status: DC

## 2021-11-16 MED ORDER — ONDANSETRON HCL 4 MG/2ML IJ SOLN: 4.0000 mg | Freq: Four times a day (QID) | INTRAMUSCULAR | Status: DC | PRN

## 2021-11-16 MED ORDER — METOPROLOL TARTRATE 25 MG PO TABS
25.0000 mg | ORAL_TABLET | Freq: Two times a day (BID) | ORAL | Status: DC
  Administered 2021-11-16 – 2021-11-17 (×2): 25 mg via ORAL
  Filled 2021-11-16 (×2): qty 1

## 2021-11-16 MED ORDER — EZETIMIBE 10 MG PO TABS
10.0000 mg | ORAL_TABLET | Freq: Every day | ORAL | Status: DC
  Administered 2021-11-17: 10 mg via ORAL
  Filled 2021-11-16: qty 1

## 2021-11-16 MED ORDER — ASPIRIN 81 MG PO TBEC
81.0000 mg | DELAYED_RELEASE_TABLET | Freq: Every day | ORAL | Status: DC
  Administered 2021-11-17: 81 mg via ORAL
  Filled 2021-11-16: qty 1

## 2021-11-16 MED ORDER — SODIUM CHLORIDE 0.9 % IV SOLN
2.0000 g | INTRAVENOUS | Status: DC
  Administered 2021-11-16: 2 g via INTRAVENOUS
  Filled 2021-11-16: qty 20
  Filled 2021-11-16: qty 2

## 2021-11-16 MED ORDER — ENOXAPARIN SODIUM 30 MG/0.3ML IJ SOSY
30.0000 mg | PREFILLED_SYRINGE | INTRAMUSCULAR | Status: DC
  Administered 2021-11-16: 30 mg via SUBCUTANEOUS
  Filled 2021-11-16: qty 0.3

## 2021-11-16 MED ORDER — ACETAMINOPHEN 650 MG RE SUPP: 650.0000 mg | Freq: Four times a day (QID) | RECTAL | Status: DC | PRN

## 2021-11-16 MED ORDER — AMLODIPINE BESYLATE 10 MG PO TABS
10.0000 mg | ORAL_TABLET | Freq: Every day | ORAL | Status: DC
  Administered 2021-11-17: 10 mg via ORAL
  Filled 2021-11-16: qty 1

## 2021-11-16 MED ORDER — IOHEXOL 350 MG/ML SOLN
100.0000 mL | Freq: Once | INTRAVENOUS | Status: AC | PRN
  Administered 2021-11-16: 80 mL via INTRAVENOUS

## 2021-11-16 MED ORDER — SENNOSIDES-DOCUSATE SODIUM 8.6-50 MG PO TABS: 1.0000 | ORAL_TABLET | Freq: Every evening | ORAL | Status: DC | PRN

## 2021-11-16 MED ORDER — ENOXAPARIN SODIUM 40 MG/0.4ML IJ SOSY: 40.0000 mg | PREFILLED_SYRINGE | INTRAMUSCULAR | Status: DC

## 2021-11-16 MED ORDER — LISINOPRIL 5 MG PO TABS
2.5000 mg | ORAL_TABLET | Freq: Every day | ORAL | Status: DC
  Administered 2021-11-17: 2.5 mg via ORAL
  Filled 2021-11-16: qty 1

## 2021-11-16 MED ORDER — PNEUMOCOCCAL 20-VAL CONJ VACC 0.5 ML IM SUSY
0.5000 mL | PREFILLED_SYRINGE | INTRAMUSCULAR | Status: AC
  Administered 2021-11-17: 0.5 mL via INTRAMUSCULAR
  Filled 2021-11-16: qty 0.5

## 2021-11-16 NOTE — Assessment & Plan Note (Signed)
-   Per ED provider, patient converted after fluid - Patient is maintaining appropriate MAP, no indication for fluid bolus at this time - Sodium chloride 125 mL/h, 1 day ordered - Metoprolol 5 mg IV every 3 hours as needed for SBP greater than 120, 3 doses ordered - Resumed home metoprolol 25 mg p.o. twice daily

## 2021-11-16 NOTE — Hospital Course (Addendum)
Samantha Saunders is a 88 year old female with hypertension, history of nephrolithiasis, who presents emergency department for chief concerns of weakness and falling. She reports increase weakness over the last 2 days especially this morning.  She was having breakfast when she stood up and she fell.  She denies head trauma, loss of consciousness.  She reports this is never happened before.  She denies chest pain, shortness of breath, dysuria, swelling of her lower extremity, diarrhea.  11/02: in ED heart rate of 146, blood pressure was 76/59, SPO2 of 97% on room air.  CBC unremarkable.  BMP creatinine slightly above normal but appears about at baseline CKD 3A.  Lactic acid is 3.3, trended down to 2.9.  High sensitive troponin is 9.  UA shows large leukocytes, 21-50 WBC/hpf, concerning for UTI.  UCx pending.  BCx also drawn.  CT C-spine, head, and CTA chest/abdomen/pelvis no acute findings.  Procalcitonin unremarkable. ED treatment: Sodium chloride 1 L bolus.  Admitted to hospitalist service for atrial flutter, SIRS, questionable sepsis given abnormal UA but patient has minimal urinary symptoms.  Ceftriaxone initiated. 11/03:     ***  Consultants:  none  Procedures: none      ASSESSMENT & PLAN:   Principal Problem:   SIRS (systemic inflammatory response syndrome) (HCC) Active Problems:   Essential hypertension   HLD (hyperlipidemia)   Dementia without behavioral disturbance (HCC)   Weakness   Atrial flutter (HCC)   Essential hypertension - Per med reconciliation patient has taken her antihypertensive medications on day of admission ready prior to presentation to the ED along with Flomax - I have resumed amlodipine 10 mg daily, lisinopril 2.5 mg daily for 11/17/2021 - Metoprolol tartrate 25 mg p.o. twice daily have been resumed for 2200 on 11/16/2021 as patient presented with atrial flutter - I am holding home Flomax at this time, a.m. team to resume when the benefits outweigh the  risk  HLD (hyperlipidemia) - Ezetimibe 10 mg daily resumed  Weakness - B12 - Fall precautions  SIRS (systemic inflammatory response syndrome) ([facility]) Patient may meets sepsis criteria with increased respiration rate, heart rate, and elevated lactic acid - Possible source is urine with large leukocytes - Ceftriaxone 2 g IV daily started - Added urine culture and blood cultures x2  Atrial flutter ([place]) - Per ED provider, patient converted after fluid - Patient is maintaining appropriate MAP, no indication for fluid bolus at this time - Sodium chloride 125 mL/h, 1 day ordered - Metoprolol 5 mg IV every 3 hours as needed for SBP greater than 120, 3 doses ordered - Resumed home metoprolol 25 mg p.o. twice daily  Increased beta blocker  Cardiac monitor    DVT prophylaxis: Lovenox Pertinent IV fluids/nutrition: 1 L normal saline ordered Central lines / invasive devices: ***  Code Status: DNR Family Communication: ***  Disposition: Currently observation status, anticipate discharge back to previous home environment but will need PT/OT evaluation given weakness TOC needs: *** Barriers to discharge / significant pending items: ***

## 2021-11-16 NOTE — ED Notes (Signed)
From the Lab. Lactate 3.3. Dr. Quentin Cornwall notified.

## 2021-11-16 NOTE — H&P (Signed)
History and Physical   Samantha Saunders EAV:[id number] DOB: [birth year] DOA: 11/16/2021  PCP: [facility]  Patient coming from: Home via EMS  I have personally briefly reviewed patient's old medical records in [facility] EMR.  Chief Concern: Weakness and fall  HPI: [name] is a 88 year old female with hypertension, history of nephrolithiasis, who presents emergency department for chief concerns of weakness and falling.  Initial vitals in the emergency department showed temperature of 98, respiration rate of 18, heart rate of 146, blood pressure was 76/59, SPO2 of 97% on room air.  Serum sodium is 136, potassium 3.6, chloride 100, bicarb 25, BUN of 11, serum creatinine 1.11, GFR 46, nonfasting glucose 176, WBC 7.5, hemoglobin 13.2, platelets of 276.  Lactic acid is 3.3.  High sensitive troponin is 9.  UA has been ordered and pending collection.  ED treatment: Sodium chloride 1 L bolus.  At bedside, she was able to tell me her full name, she knows she is older than 56 but she was not sure exactly how old.  She knows the current month is November and that she is in the hospital.  She does not appear to be in acute distress.   She reports increase weakness over the last 2 days especially this morning.  She was having breakfast when she stood up and she fell.  She denies head trauma, loss of consciousness.  She reports this is never happened before.  She denies chest pain, shortness of breath, dysuria, swelling of her lower extremity, diarrhea.  She denies changes to her urinary habits.  Social history: She lives in a senior apartment. She denies tobacco,etoh, and recreational drug use. She is retired and formerly worked as a [employer].  ROS: Constitutional: no weight change, no fever ENT/Mouth: no sore throat, no rhinorrhea Eyes: no eye pain, no vision changes Cardiovascular: no chest pain, no dyspnea,  no edema, no palpitations Respiratory: no cough,  no sputum, no wheezing Gastrointestinal: no nausea, no vomiting, no diarrhea, no constipation Genitourinary: no urinary incontinence, no dysuria, no hematuria Musculoskeletal: no arthralgias, no myalgias Skin: no skin lesions, no pruritus, Neuro: + weakness, no loss of consciousness, no syncope Psych: no anxiety, no depression, no decrease appetite Heme/Lymph: no bruising, no bleeding  ED Course: Discussed with emergency medicine provider, patient requiring hospitalization for chief concerns of weakness with fall and increased lactic acid  Assessment/Plan  Principal Problem:   SIRS (systemic inflammatory response syndrome) (HCC) Active Problems:   Essential hypertension   HLD (hyperlipidemia)   Dementia without behavioral disturbance (HCC)   Weakness   Atrial flutter (HCC)   Assessment and Plan:  * SIRS (systemic inflammatory response syndrome) (HCC) Patient may meets sepsis criteria with increased respiration rate, heart rate, and elevated lactic acid - Possible source is urine with large leukocytes - Ceftriaxone 2 g IV daily started - Added urine culture and blood cultures x2  Atrial flutter (HCC) - Per ED provider, patient converted after fluid - Patient is maintaining appropriate MAP, no indication for fluid bolus at this time - Sodium chloride 125 mL/h, 1 day ordered - Metoprolol 5 mg IV every 3 hours as needed for SBP greater than 120, 3 doses ordered - Resumed home metoprolol 25 mg p.o. twice daily  Weakness - B12 - Fall precautions  HLD (hyperlipidemia) - Ezetimibe 10 mg daily resumed  Essential hypertension - Per med reconciliation patient has taken her antihypertensive medications on day of admission ready prior to presentation to  the ED along with Flomax - I have resumed amlodipine 10 mg daily, lisinopril 2.5 mg daily for 11/17/2021 - Metoprolol tartrate 25 mg p.o. twice daily have been resumed for 2200 on 11/16/2021 as patient presented with atrial flutter -  I am holding home Flomax at this time, a.m. team to resume when the benefits outweigh the risk  Chart reviewed.   DVT prophylaxis: Enoxaparin Code Status: DNR Diet: Heart healthy Family Communication: No, patient states her daughter already knows she is in the hospital and daughter was at bedside.  Daughter just left to use the restroom Disposition Plan: Pending clinical course Consults called: None at this time Admission status: Telemetry medical, observation  Past Medical History:  Diagnosis Date   Heart murmur    History of kidney infection    Hyperlipidemia    Hypertension    LBBB (left bundle branch block)    Syncope and collapse    Past Surgical History:  Procedure Laterality Date   ABDOMINAL HYSTERECTOMY     Social History:  reports that she has never smoked. She has never used smokeless tobacco. She reports that she does not drink alcohol and does not use drugs.  Allergies  Allergen Reactions   Statins Hives   Family History  Problem Relation Age of Onset   Stroke Mother    Hypertension Mother    Family history: Family history reviewed and not pertinent  Prior to Admission medications   Medication Sig Start Date End Date Taking? Authorizing Provider  amLODipine (NORVASC) 10 MG tablet Take 1 tablet (10 mg total) by mouth daily. 04/21/13  Yes [place] OuchArida, Muhammad A, MD  aspirin 81 MG tablet Take 81 mg by mouth daily.   Yes [provider]  ezetimibe (ZETIA) 10 MG tablet Take 10 mg by mouth daily.   Yes [provider]  lisinopril (ZESTRIL) 2.5 MG tablet Take 2.5 mg by mouth daily. 07/11/21  Yes [provider]  metoprolol tartrate (LOPRESSOR) 25 MG tablet Take 1 tablet (25 mg total) by mouth 2 (two) times daily. 08/08/21 08/08/22 Yes Delton PrairieSmith, Dylan, MD  tamsulosin (FLOMAX) 0.4 MG CAPS capsule Take 0.4 mg by mouth.   Yes [provider]   Physical Exam: Vitals:   11/16/21 1732 11/16/21 1802 11/16/21 1837 11/16/21 1935  BP: 110/73 (!)  145/78 (!) 168/62 (!) 182/83  Pulse: (!) 140 82 80 72  Resp: (!) 41 16 18 18   Temp:  97.8 F (36.6 C) 97.8 F (36.6 C) 98.1 F (36.7 C)  TempSrc:  Oral Oral Oral  SpO2: 97% 98% 100% 100%  Weight:   54.1 kg   Height:   5\' 7"  (1.702 m)    Constitutional: appears age-appropriate, frail, NAD, calm, comfortable Eyes: PERRL, lids and conjunctivae normal ENMT: Mucous membranes are moist. Posterior pharynx clear of any exudate or lesions. Age-appropriate dentition. Hearing appropriate Neck: normal, supple, no masses, no thyromegaly Respiratory: clear to auscultation bilaterally, no wheezing, no crackles. Normal respiratory effort. No accessory muscle use.  Cardiovascular: Regular rate and rhythm, no murmurs / rubs / gallops. No extremity edema. 2+ pedal pulses. No carotid bruits.  Abdomen: no tenderness, no masses palpated, no hepatosplenomegaly. Bowel sounds positive.  Musculoskeletal: no clubbing / cyanosis. No joint deformity upper and lower extremities. Good ROM, no contractures, no atrophy. Normal muscle tone.  Skin: no rashes, lesions, ulcers. No induration Neurologic: Sensation intact. Strength 5/5 in all 4.  Psychiatric: Normal judgment and insight. Alert and oriented x 3. Normal mood.   EKG:  independently reviewed, showing sinus rhythm with rate of 93, QTc 483  Chest x-ray on Admission: I personally reviewed and I agree with radiologist reading as below.  CT Angio Chest/Abd/Pel for Dissection W and/or Wo Contrast  Result Date: 11/16/2021 CLINICAL DATA:  Fall. EXAM: CT ANGIOGRAPHY CHEST, ABDOMEN AND PELVIS TECHNIQUE: Non-contrast CT of the chest was initially obtained. Multidetector CT imaging through the chest, abdomen and pelvis was performed using the standard protocol during bolus administration of intravenous contrast. Multiplanar reconstructed images and MIPs were obtained and reviewed to evaluate the vascular anatomy. RADIATION DOSE REDUCTION: This exam was performed according to  the departmental dose-optimization program which includes automated exposure control, adjustment of the mA and/or kV according to patient size and/or use of iterative reconstruction technique. CONTRAST:  74mL OMNIPAQUE IOHEXOL 350 MG/ML SOLN COMPARISON:  Jun 06, 2020. FINDINGS: CTA CHEST FINDINGS Cardiovascular: 4 cm ascending thoracic aortic aneurysm is noted. Atherosclerosis of thoracic aorta is noted without dissection. Normal cardiac size. No pericardial effusion. Mediastinum/Nodes: No enlarged mediastinal, hilar, or axillary lymph nodes. Thyroid gland, trachea, and esophagus demonstrate no significant findings. Lungs/Pleura: Lungs are clear. No pleural effusion or pneumothorax. Musculoskeletal: No chest wall abnormality. No acute or significant osseous findings. Review of the MIP images confirms the above findings. CTA ABDOMEN AND PELVIS FINDINGS VASCULAR Aorta: Atherosclerosis of thoracic aorta is noted without aneurysm or dissection. Celiac: Patent without evidence of aneurysm, dissection, vasculitis or significant stenosis. SMA: Patent without evidence of aneurysm, dissection, vasculitis or significant stenosis. Renals: Both renal arteries are patent without evidence of aneurysm, dissection, vasculitis, fibromuscular dysplasia or significant stenosis. IMA: Patent without evidence of aneurysm, dissection, vasculitis or significant stenosis. Inflow: Patent without evidence of aneurysm, dissection, vasculitis or significant stenosis. Veins: No obvious venous abnormality within the limitations of this arterial phase study. Review of the MIP images confirms the above findings. NON-VASCULAR Hepatobiliary: Probable small right hepatic cyst is noted. No cholelithiasis or biliary dilatation is noted. Pancreas: Unremarkable. No pancreatic ductal dilatation or surrounding inflammatory changes. Spleen: Normal in size without focal abnormality. Adrenals/Urinary Tract: Adrenal glands and kidneys are unremarkable.  Nonobstructive right renal calculus is noted. No hydronephrosis or renal obstruction is noted. Moderate urinary bladder distention is noted. Stomach/Bowel: The stomach appears normal. There is no evidence of bowel obstruction or inflammation. Sigmoid diverticulosis is noted without inflammation. Lymphatic: No adenopathy is noted. Reproductive: Status post hysterectomy. No adnexal masses. Pessary is again noted. Other: No abdominal wall hernia or abnormality. No abdominopelvic ascites. Musculoskeletal: Probable old L1 and L2 compression fractures are noted. No acute osseous abnormality is noted. Review of the MIP images confirms the above findings. IMPRESSION: 4.0 cm ascending thoracic aortic aneurysm. Recommend annual imaging followup by CTA or MRA. This recommendation follows 2010 ACCF/AHA/AATS/ACR/ASA/SCA/SCAI/SIR/STS/SVM Guidelines for the Diagnosis and Management of Patients with Thoracic Aortic Disease. Circulation. 2010; 121: B048-G891. Aortic aneurysm NOS (ICD10-I71.9). No evidence of thoracic or abdominal aortic dissection. Nonobstructive right renal calculus. Moderate urinary bladder distention is noted. Sigmoid diverticulosis without inflammation. Aortic Atherosclerosis (ICD10-I70.0). Electronically Signed   By: Lupita Raider M.D.   On: 11/16/2021 15:34   CT Cervical Spine Wo Contrast  Result Date: 11/16/2021 CLINICAL DATA:  Fall EXAM: CT CERVICAL SPINE WITHOUT CONTRAST TECHNIQUE: Multidetector CT imaging of the cervical spine was performed without intravenous contrast. Multiplanar CT image reconstructions were also generated. RADIATION DOSE REDUCTION: This exam was performed according to the departmental dose-optimization program which includes automated exposure control, adjustment of the mA and/or kV according to patient size and/or  use of iterative reconstruction technique. COMPARISON:  None Available. FINDINGS: Alignment: There is mild retrolisthesis of C3 on C4 and C4 on C5, and mild  anterolisthesis of C7 on T1. There is no jumped or perched facet or other evidence of traumatic malalignment. Skull base and vertebrae: Skull base alignment is maintained. Vertebral body heights are preserved. There is no evidence of acute fracture. There is no suspicious osseous lesion. Soft tissues and spinal canal: No prevertebral fluid or swelling. No visible canal hematoma. Disc levels: There is advanced disc space narrowing and degenerative endplate change throughout the cervical spine with up to at least moderate spinal canal stenosis at C3-C4. There is comparatively mild-to-moderate facet arthropathy throughout the cervical spine with multilevel neural foraminal stenosis. Upper chest: Imaged lung apices are clear. Other: None. IMPRESSION: 1. No acute fracture or traumatic malalignment of the cervical spine. 2. Advanced multilevel degenerative changes with at least moderate spinal canal stenosis at C3-C4. Electronically Signed   By: Valetta Mole M.D.   On: 11/16/2021 15:27   CT HEAD WO CONTRAST (5MM)  Result Date: 11/16/2021 CLINICAL DATA:  Fall EXAM: CT HEAD WITHOUT CONTRAST TECHNIQUE: Contiguous axial images were obtained from the base of the skull through the vertex without intravenous contrast. RADIATION DOSE REDUCTION: This exam was performed according to the departmental dose-optimization program which includes automated exposure control, adjustment of the mA and/or kV according to patient size and/or use of iterative reconstruction technique. COMPARISON:  MRI brain 02/28/2005.  CT head 09/14/2017 FINDINGS: Brain: No evidence of acute infarction, hemorrhage, hydrocephalus, extra-axial collection or mass lesion/mass effect. There is stable mild periventricular white matter hypodensity, likely chronic small vessel ischemic change. Vascular: Atherosclerotic calcifications are present within the cavernous internal carotid arteries. Skull: Normal. Negative for fracture or focal lesion. Sinuses/Orbits: No  acute finding. Other: None. IMPRESSION: 1. No acute intracranial process. 2. Stable mild chronic small vessel ischemic change of the white matter. Electronically Signed   By: Ronney Asters M.D.   On: 11/16/2021 15:21   DG Chest Portable 1 View  Result Date: 11/16/2021 CLINICAL DATA:  Fall EXAM: PORTABLE CHEST 1 VIEW COMPARISON:  Chest x-ray dated August 07, 2021 FINDINGS: Cardiac and mediastinal contours are unchanged. Mild eventration of the bilateral diaphragms. Lungs are clear. No large pleural effusion or evidence of pneumothorax. IMPRESSION: No active disease. Electronically Signed   By: Yetta Glassman M.D.   On: 11/16/2021 14:34    Labs on Admission: I have personally reviewed following labs  CBC: Recent Labs  Lab 11/16/21 1405  WBC 7.5  HGB 13.2  HCT 41.1  MCV 87.6  PLT 161   Basic Metabolic Panel: Recent Labs  Lab 11/16/21 1405  NA 136  K 3.6  CL 100  CO2 25  GLUCOSE 178*  BUN 11  CREATININE 1.11*  CALCIUM 9.3   GFR: Estimated Creatinine Clearance: 27 mL/min (A) (by C-G formula based on SCr of 1.11 mg/dL (H)).  Urine analysis:    Component Value Date/Time   COLORURINE STRAW (A) 11/16/2021 1542   APPEARANCEUR HAZY (A) 11/16/2021 1542   APPEARANCEUR Clear 09/26/2021 1258   LABSPEC 1.010 11/16/2021 1542   LABSPEC 1.012 01/01/2014 1116   PHURINE 8.0 11/16/2021 1542   GLUCOSEU NEGATIVE 11/16/2021 1542   GLUCOSEU Negative 01/01/2014 1116   HGBUR MODERATE (A) 11/16/2021 1542   BILIRUBINUR NEGATIVE 11/16/2021 1542   BILIRUBINUR Negative 09/26/2021 1258   BILIRUBINUR Negative 01/01/2014 1116   KETONESUR NEGATIVE 11/16/2021 1542   PROTEINUR NEGATIVE 11/16/2021 1542  NITRITE NEGATIVE 11/16/2021 1542   LEUKOCYTESUR LARGE (A) 11/16/2021 1542   LEUKOCYTESUR 3+ 01/01/2014 1116   Dr. Sedalia Muta [facility]  If 7PM-7AM, please contact overnight-coverage provider If 7AM-7PM, please contact day coverage provider [email or web address]  11/16/2021, 7:58 PM

## 2021-11-16 NOTE — Assessment & Plan Note (Signed)
-   Per med reconciliation patient has taken her antihypertensive medications on day of admission ready prior to presentation to the ED along with Flomax - I have resumed amlodipine 10 mg daily, lisinopril 2.5 mg daily for 11/17/2021 - Metoprolol tartrate 25 mg p.o. twice daily have been resumed for 2200 on 11/16/2021 as patient presented with atrial flutter - I am holding home Flomax at this time, a.m. team to resume when the benefits outweigh the risk

## 2021-11-16 NOTE — Assessment & Plan Note (Signed)
-   B12 - Fall precautions

## 2021-11-16 NOTE — Assessment & Plan Note (Signed)
-   Ezetimibe 10 mg daily resumed 

## 2021-11-16 NOTE — ED Notes (Signed)
Pt taken straight to ED 2 after EKG performed.

## 2021-11-16 NOTE — Assessment & Plan Note (Signed)
Patient may meets sepsis criteria with increased respiration rate, heart rate, and elevated lactic acid - Possible source is urine with large leukocytes - Ceftriaxone 2 g IV daily started - Added urine culture and blood cultures x2

## 2021-11-16 NOTE — ED Triage Notes (Addendum)
Pt comes with c/o fall this am when she got up. Pt state she was eating her cereal and finished. Pt states she went to go lay down and that is when she fell. Pt unsure if she hit her head. No known blood thinners.  Pt states she is now having some mid to lower back pain. Pt state she felt dizzy when she got up this am. Pt states her BP is low too.   Pt states she still feels dizzy. Pt states dec intake at home.

## 2021-11-16 NOTE — ED Provider Notes (Signed)
[facility] Provider Note    Event Date/Time   First MD Initiated Contact with Patient 11/16/21 1351     (approximate)   History   Back Pain   HPI  Samantha Saunders is a 88 y.o. female.  Presents to the ER for evaluation of weakness as well as fall and back pain.  Patient states she woke up feeling weak to get some cereal.  He fell cannot recall if she hit her head.  Denies any fevers or chills denies any chest pain or pressure.  Patient brought into triage, be tachycardic to the 140s as well as hypotensive.       Physical Exam   Triage Vital Signs: ED Triage Vitals  Enc Vitals Group     BP 11/16/21 1341 (!) 76/59     Pulse Rate 11/16/21 1341 (!) 146     Resp 11/16/21 1341 18     Temp 11/16/21 1341 98 F (36.7 C)     Temp src --      SpO2 11/16/21 1341 97 %     Weight --      Height --      Head Circumference --      Peak Flow --      Pain Score 11/16/21 1344 10     Pain Loc --      Pain Edu? --      Excl. in [place]? --     Most recent vital signs: Vitals:   11/16/21 1341 11/16/21 1408  BP: (!) 76/59 (!) 153/84  Pulse: (!) 146 91  Resp: 18 (!) 25  Temp: 98 F (36.7 C)   SpO2: 97% 99%     Constitutional: Alert, frail appearing Eyes: Conjunctivae are normal.  Head: Atraumatic. Nose: No congestion/rhinnorhea. Mouth/Throat: Mucous membranes are moist.   Neck: Painless ROM.  Cardiovascular: Tachycardic regular rhythm Respiratory: Normal respiratory effort.  No retractions.  Gastrointestinal: Soft and nontender.  Musculoskeletal:  no deformity Neurologic:  MAE spontaneously. No gross focal neurologic deficits are appreciated.  Skin:  Skin is warm, dry and intact. No rash noted. Psychiatric: Mood and affect are normal. Speech and behavior are normal.    ED Results / Procedures / Treatments   Labs (all labs ordered are listed, but only abnormal results are displayed) Labs Reviewed  BASIC METABOLIC PANEL - Abnormal; Notable for the  following components:      Result Value   Glucose, Bld 178 (*)    Creatinine, Ser 1.11 (*)    GFR, Estimated 46 (*)    All other components within normal limits  LACTIC ACID, PLASMA - Abnormal; Notable for the following components:   Lactic Acid, Venous 3.3 (*)    All other components within normal limits  CBC  LACTIC ACID, PLASMA  URINALYSIS, ROUTINE W REFLEX MICROSCOPIC  TROPONIN I (HIGH SENSITIVITY)  TROPONIN I (HIGH SENSITIVITY)     EKG  ED ECG REPORT I, Merlyn Lot, the attending physician, personally viewed and interpreted this ECG.   Date: 11/16/2021  EKG Time: 13:51  Rate: 145  Rhythm: aflutter with rvr  Axis: normal  Intervals: wide qrs  ST&T Change: nonspecific st abn     RADIOLOGY Please see ED Course for my review and interpretation.  I personally reviewed all radiographic images ordered to evaluate for the above acute complaints and reviewed radiology reports and findings.  These findings were personally discussed with the patient.  Please see medical record for radiology report.  PROCEDURES:  Critical Care performed: Yes, see critical care procedure note(s)  .Critical Care  Performed by: Willy Eddy, MD Authorized by: Willy Eddy, MD   Critical care provider statement:    Critical care time (minutes):  32   Critical care was necessary to treat or prevent imminent or life-threatening deterioration of the following conditions:  Circulatory failure   Critical care was time spent personally by me on the following activities:  Ordering and performing treatments and interventions, ordering and review of laboratory studies, ordering and review of radiographic studies, pulse oximetry, re-evaluation of patient's condition, review of old charts, obtaining history from patient or surrogate, examination of patient, evaluation of patient's response to treatment, discussions with primary provider, discussions with consultants and development of  treatment plan with patient or surrogate    MEDICATIONS ORDERED IN ED: Medications  sodium chloride 0.9 % bolus 1,000 mL (1,000 mLs Intravenous New Bag/Given 11/16/21 1429)  iohexol (OMNIPAQUE) 350 MG/ML injection 100 mL (80 mLs Intravenous Contrast Given 11/16/21 1459)     IMPRESSION / MDM / ASSESSMENT AND PLAN / ED COURSE  I reviewed the triage vital signs and the nursing notes.                              Differential diagnosis includes, but is not limited to, sepsis, dysrhythmia, cva, aneurysm, hematoma, ischemia, dehydration, ACS,  Patient presenting to the ER for evaluation of symptoms as described above.  Based on symptoms, risk factors and considered above differential, this presenting complaint could reflect a potentially life-threatening illness therefore the patient will be placed on continuous pulse oximetry and telemetry for monitoring.  Laboratory evaluation will be sent to evaluate for the above complaints.  Time of presentation to cardiac attention CT imaging ordered for the above differential.  Clinical Course as of 11/16/21 1607  Thu Nov 16, 2021  1411 Patient appears to have self converted to rate control repeat EKG normotensive at this time. [PR]  1434 Chest x-ray on my review and interpretation does not show any evidence of pneumothorax or consolidation. [PR]  1447 Lactate is elevated 2.3. White count for this may be demand ischemia in setting of hypotension in setting of her tachycardia.  That has since resolved.  Patient is receiving IV fluids.  Will observe.  CT imaging pending. [PR]  1607 Patient still feeling weak, but vital signs are stabilizing.  The concern is that patient is having significant hypotension in the setting of atrial flutter she is now in a sinus rhythm.  Troponins initially negative.  We are awaiting urinalysis has been having dysuria.  Will consult hospitalist for admission [PR]    Clinical Course User Index [PR] Willy Eddy, MD     FINAL CLINICAL IMPRESSION(S) / ED DIAGNOSES   Final diagnoses:  Atrial flutter with rapid ventricular response (HCC)     Rx / DC Orders   ED Discharge Orders     None        Note:  This document was prepared using Dragon voice recognition software and may include unintentional dictation errors.    Willy Eddy, MD 11/16/21 [phone]

## 2021-11-17 ENCOUNTER — Telehealth: Payer: Self-pay | Admitting: *Deleted

## 2021-11-17 ENCOUNTER — Ambulatory Visit: Payer: Medicare Other | Attending: Physician Assistant

## 2021-11-17 DIAGNOSIS — R531 Weakness: Secondary | ICD-10-CM | POA: Diagnosis not present

## 2021-11-17 DIAGNOSIS — I4892 Unspecified atrial flutter: Secondary | ICD-10-CM | POA: Diagnosis not present

## 2021-11-17 DIAGNOSIS — F0390 Unspecified dementia without behavioral disturbance: Secondary | ICD-10-CM | POA: Diagnosis not present

## 2021-11-17 DIAGNOSIS — E785 Hyperlipidemia, unspecified: Secondary | ICD-10-CM | POA: Diagnosis not present

## 2021-11-17 DIAGNOSIS — I471 Supraventricular tachycardia, unspecified: Secondary | ICD-10-CM

## 2021-11-17 DIAGNOSIS — R6510 Systemic inflammatory response syndrome (SIRS) of non-infectious origin without acute organ dysfunction: Secondary | ICD-10-CM | POA: Diagnosis not present

## 2021-11-17 DIAGNOSIS — I10 Essential (primary) hypertension: Secondary | ICD-10-CM

## 2021-11-17 LAB — CBC
HCT: 41.4 % (ref 36.0–46.0)
Hemoglobin: 13.5 g/dL (ref 12.0–15.0)
MCH: 28.6 pg (ref 26.0–34.0)
MCHC: 32.6 g/dL (ref 30.0–36.0)
MCV: 87.7 fL (ref 80.0–100.0)
Platelets: 275 10*3/uL (ref 150–400)
RBC: 4.72 MIL/uL (ref 3.87–5.11)
RDW: 13.3 % (ref 11.5–15.5)
WBC: 7.7 10*3/uL (ref 4.0–10.5)
nRBC: 0 % (ref 0.0–0.2)

## 2021-11-17 LAB — URINE CULTURE: Culture: 30000 — AB

## 2021-11-17 LAB — BASIC METABOLIC PANEL
Anion gap: 11 (ref 5–15)
BUN: 9 mg/dL (ref 8–23)
CO2: 28 mmol/L (ref 22–32)
Calcium: 9.1 mg/dL (ref 8.9–10.3)
Chloride: 101 mmol/L (ref 98–111)
Creatinine, Ser: 0.79 mg/dL (ref 0.44–1.00)
GFR, Estimated: >60 mL/min (ref >60)
Glucose, Bld: 103 mg/dL — ABNORMAL HIGH (ref 70–99)
Potassium: 3.4 mmol/L — ABNORMAL LOW (ref 3.5–5.1)
Sodium: 140 mmol/L (ref 135–145)

## 2021-11-17 MED ORDER — ENOXAPARIN SODIUM 40 MG/0.4ML IJ SOSY: 40.0000 mg | PREFILLED_SYRINGE | INTRAMUSCULAR | Status: DC

## 2021-11-17 MED ORDER — ADULT MULTIVITAMIN W/MINERALS CH
1.0000 | ORAL_TABLET | Freq: Every day | ORAL | Status: DC
  Administered 2021-11-17: 1 via ORAL
  Filled 2021-11-17: qty 1

## 2021-11-17 MED ORDER — METOPROLOL TARTRATE 50 MG PO TABS: 50.0000 mg | ORAL_TABLET | Freq: Two times a day (BID) | ORAL | Status: DC

## 2021-11-17 MED ORDER — CEPHALEXIN 500 MG PO CAPS: 500.0000 mg | ORAL_CAPSULE | Freq: Three times a day (TID) | ORAL | 0 refills | Status: AC

## 2021-11-17 MED ORDER — METOPROLOL TARTRATE 50 MG PO TABS: 50.0000 mg | ORAL_TABLET | Freq: Two times a day (BID) | ORAL | 0 refills | Status: DC

## 2021-11-17 MED ORDER — ENSURE ENLIVE PO LIQD
237.0000 mL | Freq: Three times a day (TID) | ORAL | Status: DC
  Administered 2021-11-17: 237 mL via ORAL

## 2021-11-17 NOTE — Progress Notes (Signed)
Initial Nutrition Assessment  DOCUMENTATION CODES:   Non-severe (moderate) malnutrition in context of social or environmental circumstances  INTERVENTION:   -Ensure Enlive po TID, each supplement provides 350 kcal and 20 grams of protein -MVI with minerals daily -Liberalize diet to regular for widest variety of meal selections  NUTRITION DIAGNOSIS:   Moderate Malnutrition related to social / environmental circumstances as evidenced by mild fat depletion, moderate fat depletion, mild muscle depletion, moderate muscle depletion.  GOAL:   Patient will meet greater than or equal to 90% of their needs  MONITOR:   PO intake, Supplement acceptance  REASON FOR ASSESSMENT:   Malnutrition Screening Tool    ASSESSMENT:   Pt with PMH of hypertension, history of nephrolithiasis, who presents for chief concerns of weakness and falling.  Pt admitted with SIRS, atrial flutter, and weakness.   Reviewed I/O's: +1 L x 24 hours   Pt sitting up in recliner chair at time of visit. Pt smiling and shared with this RD that she used to work for [employer] and is excited for the holidays she see her great-grandchildren. Pt reports she lives alone in the senior apartments in [place]. She typically consumes 2 meals per day- lunch of yogurt and dinner of meat, starch, and vegetable. Per pt, she skips breakfast as she often does not wake up until 11 AM ("I don;t work anymore so I don't need to get up early"). Pt reports she walks several miles a day around her apartment complex.   Pt admits that she has been eating less and losing weight over the past few months, but unsure of UBW or how much weight she has lost. Reviewed wt hx; pt has experienced 1 % wt loss over the past 3 months, which is not significant for time frame.   Discussed importance of good meal and supplement intake to promote healing. Pt amenable to Ensure.   Medications reviewed.   Labs reviewed: K: 3.2, CBGS: 142 (inpatient orders for  glycemic control are none).    NUTRITION - FOCUSED PHYSICAL EXAM:  Flowsheet Row Most Recent Value  Orbital Region Mild depletion  Upper Arm Region Moderate depletion  Thoracic and Lumbar Region Mild depletion  Buccal Region Mild depletion  Temple Region Mild depletion  Clavicle Bone Region Moderate depletion  Clavicle and Acromion Bone Region Moderate depletion  Scapular Bone Region Moderate depletion  Dorsal Hand Mild depletion  Patellar Region Moderate depletion  Anterior Thigh Region Moderate depletion  Posterior Calf Region Moderate depletion  Edema (RD Assessment) Mild  Hair Reviewed  Eyes Reviewed  Mouth Reviewed  Skin Reviewed  Nails Reviewed       Diet Order:   Diet Order             Diet regular Room service appropriate? Yes; Fluid consistency: Thin  Diet effective now           Diet - low sodium heart healthy                   EDUCATION NEEDS:   Education needs have been addressed  Skin:  Skin Assessment: Reviewed RN Assessment  Last BM:  11/15/21  Height:   Ht Readings from Last 1 Encounters:  11/16/21 5\' 7"  (1.702 m)    Weight:   Wt Readings from Last 1 Encounters:  11/16/21 54.1 kg    Ideal Body Weight:  61.4 kg  BMI:  Body mass index is 18.68 kg/m.  Estimated Nutritional Needs:   Kcal:  2458-0998  Protein:  85-100 grams  Fluid:  > 1.6 L    Loistine Chance, RD, LDN, [place] Registered Dietitian II Certified Diabetes Care and Education Specialist Please refer to [facility] for RD and/or RD on-call/weekend/after hours pager

## 2021-11-17 NOTE — Discharge Summary (Signed)
Physician Discharge Summary   Patient: Samantha Saunders MRN: [id number]  DOB: Jan 17, 1926   Admit:     Date of Admission: 11/16/2021 Admitted from: home   Discharge: Date of discharge: 11/17/21 Disposition: Home Condition at discharge: good  CODE STATUS: DNR     Discharge Physician: Emeterio Reeve, DO [facility]     PCP: Center, [facility]  Recommendations for Outpatient Follow-up:  Follow up with PCP Center, [facility] in 1-2 weeks Please obtain labs/tests: UA possible urine culture, CBC, BMP in 1-2 weeks Follow w/ cardiology in 2-4 weeks to review heart monitor results Please follow up on the following pending results: urine culture  PCP AND OTHER OUTPATIENT PROVIDERS: SEE [place] ADDITION TO GENERIC AVS PATIENT INFO    Discharge Instructions     Diet - low sodium heart healthy   Complete by: As directed    Discharge instructions   Complete by: As directed    Keep follow up with cardiology for heart monitor.   We have increased your metoprolol dose from 25 mg to 50 mg twice daily to hopefully prevent rapid heart rate. This may cause lower blood pressure - if you have lightheadedness or blood pressure lower than 120 top number and/or lower than 70 bottom number please STOP your amlodipine but continue the metoprolol and lisinopril.   Please make a follow up appointment with PCP in 1-2 weeks or sooner as needed, may need recheck on urine, we are treating for UTI.   Increase activity slowly   Complete by: As directed          Discharge Diagnoses: Principal Problem:   SIRS (systemic inflammatory response syndrome) (HCC) Active Problems:   Essential hypertension   HLD (hyperlipidemia)   Dementia without behavioral disturbance (HCC)   Weakness   Atrial flutter [facility])       Hospital Course: [name] is a 88 year old female with hypertension, history  of nephrolithiasis, who presents emergency department for chief concerns of weakness and falling. She reports increase weakness over the last 2 days especially this morning.  She was having breakfast when she stood up and she fell.  She denies head trauma, loss of consciousness.  She reports this is never happened before.  She denies chest pain, shortness of breath, dysuria, swelling of her lower extremity, diarrhea.  11/02: in ED heart rate of 146, blood pressure was 76/59, SPO2 of 97% on room air.  CBC unremarkable.  BMP creatinine slightly above normal but appears about at baseline CKD 3A.  Lactic acid is 3.3, trended down to 2.9.  High sensitive troponin is 9.  UA shows large leukocytes, 21-50 WBC/hpf, concerning for UTI.  UCx pending.  BCx also drawn.  CT C-spine, head, and CTA chest/abdomen/pelvis no acute findings.  Procalcitonin unremarkable. ED treatment: Sodium chloride 1 L bolus.  Admitted to hospitalist service for atrial flutter, SIRS, questionable sepsis given abnormal UA but patient has no unusual urinary symptoms.  Ceftriaxone initiated. 11/03: doing well, pt would like to go home if possible. No palpitations, lightheadedness, chest pain. Setting up with cardiology follow up and event monitor. Increased metoprolol dose.    Consultants:  none  Procedures: none      ASSESSMENT & PLAN:   Principal Problem:   SIRS (systemic inflammatory response syndrome) (HCC) Active Problems:   Essential hypertension   HLD (hyperlipidemia)   Dementia without behavioral disturbance (HCC)   Weakness  Atrial flutter (HCC)   Essential hypertension Continue home lisinopril and amlodipine increased metoprolol   HLD (hyperlipidemia) Ezetimibe 10 mg daily resumed  Weakness Outpatient follow up   SIRS (systemic inflammatory response syndrome) (HCC) Patient may meets sepsis criteria with increased respiration rate, heart rate, and elevated lactic acid --> likely d/t SVT/Aflutter  Atrial  flutter [facility]) Per ED provider, patient converted after fluid Patient is maintaining appropriate MAP, no indication for fluid bolus at this time Sodium chloride 125 mL/h, 1 day ordered Resumed home metoprolol 25 mg p.o. twice daily --> increased to 50 mg bid  Cardiac monitor to [facility] lto patient - see cardiology notes Outpatient f/u w/ Dr Kirke Corin          Discharge Instructions  Allergies as of 11/17/2021       Reactions   Statins Hives        Medication List     TAKE these medications    amLODipine 10 MG tablet Commonly known as: NORVASC Take 1 tablet (10 mg total) by mouth daily.   aspirin 81 MG tablet Take 81 mg by mouth daily.   ezetimibe 10 MG tablet Commonly known as: ZETIA Take 10 mg by mouth daily.   lisinopril 2.5 MG tablet Commonly known as: ZESTRIL Take 2.5 mg by mouth daily.   metoprolol tartrate 50 MG tablet Commonly known as: LOPRESSOR Take 1 tablet (50 mg total) by mouth 2 (two) times daily. What changed:  medication strength how much to take   tamsulosin 0.4 MG Caps capsule Commonly known as: FLOMAX Take 0.4 mg by mouth.          Allergies  Allergen Reactions   Statins Hives     Subjective: doing well, pt would like to go home if possible. No palpitations, lightheadedness, chest pain.   Discharge Exam: BP (!) 143/59 (BP Location: Left Arm)   Pulse 70   Temp 98 F (36.7 C)   Resp 16   Ht 5\' 7"  (1.702 m)   Wt 54.1 kg   SpO2 99%   BMI 18.68 kg/m  General: Pt is alert, awake, not in acute distress Cardiovascular: RRR, S1/S2 +, no rubs, no gallops Respiratory: CTA bilaterally, no wheezing, no rhonchi Abdominal: Soft, NT, ND, bowel sounds + Extremities: no edema, no cyanosis     The results of significant diagnostics from this hospitalization (including imaging, microbiology, ancillary and laboratory) are listed below for reference.     Microbiology: Recent Results (from the past 240 hour(s))  Culture, blood (Routine  X 2) w Reflex to ID Panel     Status: None (Preliminary result)   Collection Time: 11/16/21  6:13 PM   Specimen: BLOOD  Result Value Ref Range Status   Specimen Description BLOOD LEFT ARM  Final   Special Requests   Final    BOTTLES DRAWN AEROBIC AND ANAEROBIC Blood Culture adequate volume   Culture   Final    NO GROWTH < 12 HOURS Performed at [facility], [address], [place], [place] [address]    Report Status PENDING  Incomplete  Culture, blood (Routine X 2) w Reflex to ID Panel     Status: None (Preliminary result)   Collection Time: 11/17/21 12:56 AM   Specimen: BLOOD  Result Value Ref Range Status   Specimen Description BLOOD RIGHT ANTECUBITAL  Final   Special Requests   Final    BOTTLES DRAWN AEROBIC AND ANAEROBIC Blood Culture adequate volume   Culture   Final  NO GROWTH < 12 HOURS Performed at [facility], [address] [place], [place] [address]    Report Status PENDING  Incomplete     Labs: BNP (last 3 results) Recent Labs    08/07/21 2247  BNP 78.2   Basic Metabolic Panel: Recent Labs  Lab 11/16/21 1405 11/17/21 0502  NA 136 140  K 3.6 3.4*  CL 100 101  CO2 25 28  GLUCOSE 178* 103*  BUN 11 9  CREATININE 1.11* 0.79  CALCIUM 9.3 9.1   Liver Function Tests: No results for input(s): "AST", "ALT", "ALKPHOS", "BILITOT", "PROT", "ALBUMIN" in the last 168 hours. No results for input(s): "LIPASE", "AMYLASE" in the last 168 hours. No results for input(s): "AMMONIA" in the last 168 hours. CBC: Recent Labs  Lab 11/16/21 1405 11/17/21 0502  WBC 7.5 7.7  HGB 13.2 13.5  HCT 41.1 41.4  MCV 87.6 87.7  PLT 276 275   Cardiac Enzymes: No results for input(s): "CKTOTAL", "CKMB", "CKMBINDEX", "TROPONINI" in the last 168 hours. BNP: Invalid input(s): "POCBNP" CBG: No results for input(s): "GLUCAP" in the last 168 hours. D-Dimer No results for input(s): "DDIMER" in the last 72 hours. Hgb A1c No results for input(s): "HGBA1C" in  the last 72 hours. Lipid Profile No results for input(s): "CHOL", "HDL", "LDLCALC", "TRIG", "CHOLHDL", "LDLDIRECT" in the last 72 hours. Thyroid function studies No results for input(s): "TSH", "T4TOTAL", "T3FREE", "THYROIDAB" in the last 72 hours.  Invalid input(s): "FREET3" Anemia work up Recent Labs    11/16/21 1727  VITAMINB12 864   Urinalysis    Component Value Date/Time   COLORURINE STRAW (A) 11/16/2021 1542   APPEARANCEUR HAZY (A) 11/16/2021 1542   APPEARANCEUR Clear 09/26/2021 1258   LABSPEC 1.010 11/16/2021 1542   LABSPEC 1.012 01/01/2014 1116   PHURINE 8.0 11/16/2021 1542   GLUCOSEU NEGATIVE 11/16/2021 1542   GLUCOSEU Negative 01/01/2014 1116   HGBUR MODERATE (A) 11/16/2021 1542   BILIRUBINUR NEGATIVE 11/16/2021 1542   BILIRUBINUR Negative 09/26/2021 1258   BILIRUBINUR Negative 01/01/2014 1116   KETONESUR NEGATIVE 11/16/2021 1542   PROTEINUR NEGATIVE 11/16/2021 1542   NITRITE NEGATIVE 11/16/2021 1542   LEUKOCYTESUR LARGE (A) 11/16/2021 1542   LEUKOCYTESUR 3+ 01/01/2014 1116   Sepsis Labs Recent Labs  Lab 11/16/21 1405 11/17/21 0502  WBC 7.5 7.7   Microbiology Recent Results (from the past 240 hour(s))  Culture, blood (Routine X 2) w Reflex to ID Panel     Status: None (Preliminary result)   Collection Time: 11/16/21  6:13 PM   Specimen: BLOOD  Result Value Ref Range Status   Specimen Description BLOOD LEFT ARM  Final   Special Requests   Final    BOTTLES DRAWN AEROBIC AND ANAEROBIC Blood Culture adequate volume   Culture   Final    NO GROWTH < 12 HOURS Performed at [facility], [address] [place], [place] [address]    Report Status PENDING  Incomplete  Culture, blood (Routine X 2) w Reflex to ID Panel     Status: None (Preliminary result)   Collection Time: 11/17/21 12:56 AM   Specimen: BLOOD  Result Value Ref Range Status   Specimen Description BLOOD RIGHT ANTECUBITAL  Final   Special Requests   Final    BOTTLES DRAWN AEROBIC AND  ANAEROBIC Blood Culture adequate volume   Culture   Final    NO GROWTH < 12 HOURS Performed at [facility], [address] [place], [place] [address]    Report  Status PENDING  Incomplete   Imaging CT Angio Chest/Abd/Pel for Dissection W and/or Wo Contrast  Result Date: 11/16/2021 CLINICAL DATA:  Fall. EXAM: CT ANGIOGRAPHY CHEST, ABDOMEN AND PELVIS TECHNIQUE: Non-contrast CT of the chest was initially obtained. Multidetector CT imaging through the chest, abdomen and pelvis was performed using the standard protocol during bolus administration of intravenous contrast. Multiplanar reconstructed images and MIPs were obtained and reviewed to evaluate the vascular anatomy. RADIATION DOSE REDUCTION: This exam was performed according to the departmental dose-optimization program which includes automated exposure control, adjustment of the mA and/or kV according to patient size and/or use of iterative reconstruction technique. CONTRAST:  79mL OMNIPAQUE IOHEXOL 350 MG/ML SOLN COMPARISON:  Jun 06, 2020. FINDINGS: CTA CHEST FINDINGS Cardiovascular: 4 cm ascending thoracic aortic aneurysm is noted. Atherosclerosis of thoracic aorta is noted without dissection. Normal cardiac size. No pericardial effusion. Mediastinum/Nodes: No enlarged mediastinal, hilar, or axillary lymph nodes. Thyroid gland, trachea, and esophagus demonstrate no significant findings. Lungs/Pleura: Lungs are clear. No pleural effusion or pneumothorax. Musculoskeletal: No chest wall abnormality. No acute or significant osseous findings. Review of the MIP images confirms the above findings. CTA ABDOMEN AND PELVIS FINDINGS VASCULAR Aorta: Atherosclerosis of thoracic aorta is noted without aneurysm or dissection. Celiac: Patent without evidence of aneurysm, dissection, vasculitis or significant stenosis. SMA: Patent without evidence of aneurysm, dissection, vasculitis or significant stenosis. Renals: Both renal arteries are patent without  evidence of aneurysm, dissection, vasculitis, fibromuscular dysplasia or significant stenosis. IMA: Patent without evidence of aneurysm, dissection, vasculitis or significant stenosis. Inflow: Patent without evidence of aneurysm, dissection, vasculitis or significant stenosis. Veins: No obvious venous abnormality within the limitations of this arterial phase study. Review of the MIP images confirms the above findings. NON-VASCULAR Hepatobiliary: Probable small right hepatic cyst is noted. No cholelithiasis or biliary dilatation is noted. Pancreas: Unremarkable. No pancreatic ductal dilatation or surrounding inflammatory changes. Spleen: Normal in size without focal abnormality. Adrenals/Urinary Tract: Adrenal glands and kidneys are unremarkable. Nonobstructive right renal calculus is noted. No hydronephrosis or renal obstruction is noted. Moderate urinary bladder distention is noted. Stomach/Bowel: The stomach appears normal. There is no evidence of bowel obstruction or inflammation. Sigmoid diverticulosis is noted without inflammation. Lymphatic: No adenopathy is noted. Reproductive: Status post hysterectomy. No adnexal masses. Pessary is again noted. Other: No abdominal wall hernia or abnormality. No abdominopelvic ascites. Musculoskeletal: Probable old L1 and L2 compression fractures are noted. No acute osseous abnormality is noted. Review of the MIP images confirms the above findings. IMPRESSION: 4.0 cm ascending thoracic aortic aneurysm. Recommend annual imaging followup by CTA or MRA. This recommendation follows 2010 ACCF/AHA/AATS/ACR/ASA/SCA/SCAI/SIR/STS/SVM Guidelines for the Diagnosis and Management of Patients with Thoracic Aortic Disease. Circulation. 2010; [id number]. Aortic aneurysm NOS (ICD10-I71.9). No evidence of thoracic or abdominal aortic dissection. Nonobstructive right renal calculus. Moderate urinary bladder distention is noted. Sigmoid diverticulosis without inflammation. Aortic  Atherosclerosis (ICD10-I70.0). Electronically Signed   By: Marijo Conception M.D.   On: 11/16/2021 15:34   CT Cervical Spine Wo Contrast  Result Date: 11/16/2021 CLINICAL DATA:  Fall EXAM: CT CERVICAL SPINE WITHOUT CONTRAST TECHNIQUE: Multidetector CT imaging of the cervical spine was performed without intravenous contrast. Multiplanar CT image reconstructions were also generated. RADIATION DOSE REDUCTION: This exam was performed according to the departmental dose-optimization program which includes automated exposure control, adjustment of the mA and/or kV according to patient size and/or use of iterative reconstruction technique. COMPARISON:  None Available. FINDINGS: Alignment: There is mild retrolisthesis of C3 on C4 and C4  on C5, and mild anterolisthesis of C7 on T1. There is no jumped or perched facet or other evidence of traumatic malalignment. Skull base and vertebrae: Skull base alignment is maintained. Vertebral body heights are preserved. There is no evidence of acute fracture. There is no suspicious osseous lesion. Soft tissues and spinal canal: No prevertebral fluid or swelling. No visible canal hematoma. Disc levels: There is advanced disc space narrowing and degenerative endplate change throughout the cervical spine with up to at least moderate spinal canal stenosis at C3-C4. There is comparatively mild-to-moderate facet arthropathy throughout the cervical spine with multilevel neural foraminal stenosis. Upper chest: Imaged lung apices are clear. Other: None. IMPRESSION: 1. No acute fracture or traumatic malalignment of the cervical spine. 2. Advanced multilevel degenerative changes with at least moderate spinal canal stenosis at C3-C4. Electronically Signed   By: Valetta Mole M.D.   On: 11/16/2021 15:27   CT HEAD WO CONTRAST (5MM)  Result Date: 11/16/2021 CLINICAL DATA:  Fall EXAM: CT HEAD WITHOUT CONTRAST TECHNIQUE: Contiguous axial images were obtained from the base of the skull through the  vertex without intravenous contrast. RADIATION DOSE REDUCTION: This exam was performed according to the departmental dose-optimization program which includes automated exposure control, adjustment of the mA and/or kV according to patient size and/or use of iterative reconstruction technique. COMPARISON:  MRI brain 02/28/2005.  CT head 09/14/2017 FINDINGS: Brain: No evidence of acute infarction, hemorrhage, hydrocephalus, extra-axial collection or mass lesion/mass effect. There is stable mild periventricular white matter hypodensity, likely chronic small vessel ischemic change. Vascular: Atherosclerotic calcifications are present within the cavernous internal carotid arteries. Skull: Normal. Negative for fracture or focal lesion. Sinuses/Orbits: No acute finding. Other: None. IMPRESSION: 1. No acute intracranial process. 2. Stable mild chronic small vessel ischemic change of the white matter. Electronically Signed   By: Ronney Asters M.D.   On: 11/16/2021 15:21   DG Chest Portable 1 View  Result Date: 11/16/2021 CLINICAL DATA:  Fall EXAM: PORTABLE CHEST 1 VIEW COMPARISON:  Chest x-ray dated August 07, 2021 FINDINGS: Cardiac and mediastinal contours are unchanged. Mild eventration of the bilateral diaphragms. Lungs are clear. No large pleural effusion or evidence of pneumothorax. IMPRESSION: No active disease. Electronically Signed   By: Yetta Glassman M.D.   On: 11/16/2021 14:34      Time coordinating discharge: over 30 minutes  SIGNED:  Emeterio Reeve DO [facility]

## 2021-11-17 NOTE — Telephone Encounter (Signed)
Reviewed that monitor was ordered for patient to wear. Advised it is being mailed to her home address with all instructions on how to place it. Also reviewed that our scheduling team will give her a call to set up appointment with Dr. Fletcher Anon. She verbalized understanding with no further questions at this time.

## 2021-11-17 NOTE — Telephone Encounter (Signed)
-----   Message from Rise Mu, PA-C sent at 11/17/2021 12:04 PM EDT ----- Please send Zio to patient. Dx: SVT. Follow up with Dr. Fletcher Anon to reestablish care.

## 2021-11-17 NOTE — Progress Notes (Signed)
OT Cancellation Note  Patient Details Name: Samantha Saunders MRN: [id number] DOB: 1926/10/17   Cancelled Treatment:    Reason Eval/Treat Not Completed: OT screened, no needs identified, will sign off. Order received, chart reviewed. Per conversation with PT, pt at baseline functional independence, no acute OT needs, will sign off.   Dessie Coma, M.S. OTR/L  11/17/21, 2:48 PM  ascom [phone]   Samantha Saunders 11/17/2021, 2:47 PM

## 2021-11-17 NOTE — Evaluation (Addendum)
Physical Therapy Evaluation Patient Details Name: Samantha Saunders MRN: [id number] DOB: 04/30/1926 Today's Date: 11/17/2021  History of Present Illness  88 yo female presents with weakness/fall secondary to SIRS. PMH includes HTN, hyperlipidemia, nephrolithiasis  Clinical Impression  Pt found sitting at EOB upon PT entry. MMT globally 4/5 and mild deficits in UE and LE coordination noted. Sit<>Stand independently.   Pt ambulated 2107ft independently with RW then progressed without AD. Pt performed DGI with score of 17. Pt educated on PT services and options for further therapy services. No further PT services required at this time due to return to PLOF, no DME needs.      Recommendations for follow up therapy are one component of a multi-disciplinary discharge planning process, led by the attending physician.  Recommendations may be updated based on patient status, additional functional criteria and insurance authorization.  Follow Up Recommendations Home health PT      Assistance Recommended at Discharge PRN  Patient can return home with the following  A little help with bathing/dressing/bathroom;Assist for transportation;Help with stairs or ramp for entrance;Direct supervision/assist for medications management    Equipment Recommendations None recommended by PT  Recommendations for Other Services       Functional Status Assessment Patient has had a recent decline in their functional status and demonstrates the ability to make significant improvements in function in a reasonable and predictable amount of time.     Precautions / Restrictions Precautions Precautions: Fall Restrictions Weight Bearing Restrictions: No      Mobility  Bed Mobility               General bed mobility comments: pt sitting at start of session    Transfers Overall transfer level: Independent                      Ambulation/Gait Ambulation/Gait assistance: Independent Gait Distance  (Feet): 200 Feet Assistive device: Rolling walker (2 wheels) (RW for 10 ft and then ambulated independently) Gait Pattern/deviations: Decreased step length - right, Decreased step length - left, Decreased stance time - right, Decreased stance time - left, Decreased stride length       General Gait Details: pt able to walk independently  Stairs            Wheelchair Mobility    Modified Rankin (Stroke Patients Only)       Balance Overall balance assessment: Independent   Sitting balance-Leahy Scale: Good     Standing balance support: No upper extremity supported Standing balance-Leahy Scale: Good                   Standardized Balance Assessment Standardized Balance Assessment : Dynamic Gait Index   Dynamic Gait Index Level Surface: Normal Change in Gait Speed: Mild Impairment Gait with Horizontal Head Turns: Mild Impairment Gait with Vertical Head Turns: Mild Impairment Gait and Pivot Turn: Mild Impairment Step Over Obstacle: Mild Impairment Step Around Obstacles: Mild Impairment       Pertinent Vitals/Pain Pain Assessment Pain Assessment: No/denies pain    Home Living Family/patient expects to be discharged to:: Assisted living Living Arrangements: Alone Available Help at Discharge: Family;Available PRN/intermittently Type of Home: House Home Access: Level entry       Home Layout: One level Home Equipment: Other (comment) Additional Comments: none    Prior Function Prior Level of Function : Independent/Modified Independent  Hand Dominance        Extremity/Trunk Assessment   Upper Extremity Assessment Upper Extremity Assessment: Overall WFL for tasks assessed (grossly 4/5)    Lower Extremity Assessment Lower Extremity Assessment: Overall WFL for tasks assessed (grossly 4/5)    Cervical / Trunk Assessment Cervical / Trunk Assessment: Normal  Communication   Communication: No difficulties  Cognition  Arousal/Alertness: Awake/alert Behavior During Therapy: WFL for tasks assessed/performed Overall Cognitive Status: History of cognitive impairments - at baseline                                          General Comments      Exercises     Assessment/Plan    PT Assessment Patient does not need any further PT services  PT Problem List Decreased activity tolerance;Decreased balance       PT Treatment Interventions DME instruction;Balance training;Gait training;Neuromuscular re-education;Stair training;Functional mobility training;Patient/family education;Therapeutic activities;Therapeutic exercise    PT Goals (Current goals can be found in the Care Plan section)  Acute Rehab PT Goals Patient Stated Goal: to return to independent living facility PT Goal Formulation: With patient/family Time For Goal Achievement: 12/01/21 Potential to Achieve Goals: Good    Frequency Min 2X/week     Co-evaluation               AM-PAC PT "6 Clicks" Mobility  Outcome Measure Help needed turning from your back to your side while in a flat bed without using bedrails?: None Help needed moving from lying on your back to sitting on the side of a flat bed without using bedrails?: None Help needed moving to and from a bed to a chair (including a wheelchair)?: None Help needed standing up from a chair using your arms (e.g., wheelchair or bedside chair)?: None Help needed to walk in hospital room?: None Help needed climbing 3-5 steps with a railing? : A Little 6 Click Score: 23    End of Session Equipment Utilized During Treatment: Gait belt Activity Tolerance: Patient tolerated treatment well Patient left: Other (comment);with family/visitor present (pt standing with family to change clothes) Nurse Communication: Mobility status PT Visit Diagnosis: History of falling (Z91.81)    Time: 7782-4235 PT Time Calculation (min) (ACUTE ONLY): 19 min   Charges:     PT  Treatments $Therapeutic Activity: 8-22 mins         Sugar Vanzandt O'Daniel,SPT 11/17/2021, 2:56 PM

## 2021-11-17 NOTE — TOC Initial Note (Signed)
Transition of Care [facility]) - Initial/Assessment Note    Patient Details  Name: Samantha Saunders MRN: [id number] Date of Birth: 10-Nov-1926  Transition of Care [facility]) CM/SW Contact:    Laurena Slimmer, RN Phone Number: 11/17/2021, 1:38 PM  Clinical Narrative:                  Transition of Care [facility]) Screening Note   Patient Details  Name: Samantha Saunders Date of Birth: July 29, 1926   Transition of Care [facility]) CM/SW Contact:    Laurena Slimmer, RN Phone Number: 11/17/2021, 1:38 PM    Transition of Care Department [facility]) has reviewed patient and no TOC needs have been identified at this time. We will continue to monitor patient advancement through interdisciplinary progression rounds. If new patient transition needs arise, please place a TOC consult.          Patient Goals and CMS Choice        Expected Discharge Plan and Services           Expected Discharge Date: 11/17/21                                    Prior Living Arrangements/Services                       Activities of Daily Living Home Assistive Devices/Equipment: None ADL Screening (condition at time of admission) Patient's cognitive ability adequate to safely complete daily activities?: Yes Is the patient deaf or have difficulty hearing?: No Does the patient have difficulty seeing, even when wearing glasses/contacts?: No Does the patient have difficulty concentrating, remembering, or making decisions?: No Patient able to express need for assistance with ADLs?: Yes Does the patient have difficulty dressing or bathing?: No Independently performs ADLs?: Yes (appropriate for developmental age) Does the patient have difficulty walking or climbing stairs?: Yes Weakness of Legs: None Weakness of Arms/Hands: None  Permission Sought/Granted                  Emotional Assessment              Admission diagnosis:  Weakness [R53.1] Atrial flutter with rapid ventricular response ([place])  [I48.92] Patient Active Problem List   Diagnosis Date Noted   Weakness 11/16/2021   SIRS (systemic inflammatory response syndrome) ([place]) 11/16/2021   Atrial flutter ([place]) 11/16/2021   Dementia without behavioral disturbance ([place])    Pyelonephritis 06/07/2020   Acute pyelonephritis 12/05/2019   UTI (urinary tract infection) 12/04/2019   Acute respiratory failure with hypoxia ([place]) 12/04/2019   Severe sepsis ([place]) 12/04/2019   HLD (hyperlipidemia) 12/04/2019   Hypokalemia 07/08/2018   Rectocele 10/19/2016   Prolapse of vaginal vault after hysterectomy 04/30/2016   SOB (shortness of breath) 10/23/2013   Pre-syncope 04/23/2013   Essential hypertension    PCP:  Center, [place] [facility]:   [place], [place], [address], [place] [address] Phone: [phone] Fax: [place], [place] [address] [place] [address] Phone: [phone] Fax: [phone]     Social Determinants of Health (SDOH) Interventions    Readmission Risk Interventions     No data to display

## 2021-11-17 NOTE — Progress Notes (Signed)
Mobility Specialist - Progress Note   11/17/21 1010  Mobility  Activity Ambulated with assistance to bathroom  Level of Assistance Standby assist, set-up cues, supervision of patient - no hands on  Assistive Device None  Distance Ambulated (ft) 8 ft  Activity Response Tolerated well  Mobility Referral Yes  $Mobility charge 1 Mobility   Pt sitting in recliner upon entry, utilizing RA. Pt ambulated to and from the bathroom without AD using HHA. Pt returned to the recliner, with needs within reach and family present at bedside.   Candie Mile Mobility Specialist 11/17/21 10:14 AM

## 2021-11-20 DIAGNOSIS — I471 Supraventricular tachycardia, unspecified: Secondary | ICD-10-CM

## 2021-11-21 LAB — CULTURE, BLOOD (ROUTINE X 2)
Culture: NO GROWTH
Special Requests: ADEQUATE

## 2021-11-22 LAB — CULTURE, BLOOD (ROUTINE X 2)
Culture: NO GROWTH
Special Requests: ADEQUATE

## 2021-11-29 NOTE — Telephone Encounter (Signed)
Patient has been scheduled.    Closing encounter.

## 2021-12-12 ENCOUNTER — Other Ambulatory Visit: Payer: Self-pay | Admitting: Internal Medicine

## 2021-12-12 DIAGNOSIS — F0280 Dementia in other diseases classified elsewhere without behavioral disturbance: Secondary | ICD-10-CM

## 2021-12-15 ENCOUNTER — Ambulatory Visit: Admission: RE | Admit: 2021-12-15 | Payer: Medicare Other | Source: Ambulatory Visit

## 2021-12-21 ENCOUNTER — Other Ambulatory Visit (HOSPITAL_BASED_OUTPATIENT_CLINIC_OR_DEPARTMENT_OTHER): Payer: Self-pay | Admitting: Osteopathic Medicine

## 2021-12-22 ENCOUNTER — Ambulatory Visit
Admission: RE | Admit: 2021-12-22 | Discharge: 2021-12-22 | Disposition: A | Discharge status: Home / Self Care | Payer: Medicare Other | Source: Ambulatory Visit | Attending: Internal Medicine | Admitting: Internal Medicine

## 2021-12-22 ENCOUNTER — Ambulatory Visit: Payer: Medicare Other | Admitting: Cardiovascular Disease

## 2021-12-22 DIAGNOSIS — G309 Alzheimer's disease, unspecified: Secondary | ICD-10-CM | POA: Diagnosis present

## 2021-12-22 DIAGNOSIS — F0280 Dementia in other diseases classified elsewhere without behavioral disturbance: Secondary | ICD-10-CM

## 2021-12-25 ENCOUNTER — Other Ambulatory Visit (HOSPITAL_BASED_OUTPATIENT_CLINIC_OR_DEPARTMENT_OTHER): Payer: Self-pay | Admitting: Osteopathic Medicine

## 2021-12-28 NOTE — Progress Notes (Deleted)
Cardiology Clinic Note   Patient Name: Samantha Saunders Date of Encounter: 12/28/2021  Primary Care Provider:  Center, [facility] Primary Cardiologist:  None  Patient Profile    ***  Past Medical History    Past Medical History:  Diagnosis Date   Heart murmur    History of kidney infection    Hyperlipidemia    Hypertension    LBBB (left bundle branch block)    Syncope and collapse    Past Surgical History:  Procedure Laterality Date   ABDOMINAL HYSTERECTOMY      Allergies  Allergies  Allergen Reactions   Statins Hives    History of Present Illness    ***  Home Medications    Current Outpatient Medications  Medication Sig Dispense Refill   amLODipine (NORVASC) 10 MG tablet Take 1 tablet (10 mg total) by mouth daily. 30 tablet 6   aspirin 81 MG tablet Take 81 mg by mouth daily.     ezetimibe (ZETIA) 10 MG tablet Take 10 mg by mouth daily.     lisinopril (ZESTRIL) 2.5 MG tablet Take 2.5 mg by mouth daily.     metoprolol tartrate (LOPRESSOR) 50 MG tablet Take 1 tablet (50 mg total) by mouth 2 (two) times daily. 60 tablet 0   tamsulosin (FLOMAX) 0.4 MG CAPS capsule Take 0.4 mg by mouth.     No current facility-administered medications for this visit.     Family History    Family History  Problem Relation Age of Onset   Stroke Mother    Hypertension Mother    She indicated that her mother is deceased. She indicated that her father is deceased.  Social History    Social History   Socioeconomic History   Marital status: Widowed    Spouse name: Not on file   Number of children: Not on file   Years of education: Not on file   Highest education level: Not on file  Occupational History   Not on file  Tobacco Use   Smoking status: Never   Smokeless tobacco: Never  Vaping Use   Vaping Use: Never used  Substance and Sexual Activity   Alcohol use: No   Drug use: No   Sexual activity: Not Currently    Birth control/protection:  Post-menopausal  Other Topics Concern   Not on file  Social History Narrative   Not on file   Social Determinants of Health   Financial Resource Strain: Not on file  Food Insecurity: No Food Insecurity (11/16/2021)   Hunger Vital Sign    Worried About Running Out of Food in the Last Year: Never true    Ran Out of Food in the Last Year: Never true  Transportation Needs: No Transportation Needs (11/16/2021)   PRAPARE - Administrator, Civil Service (Medical): No    Lack of Transportation (Non-Medical): No  Physical Activity: Not on file  Stress: Not on file  Social Connections: Not on file  Intimate Partner Violence: Not At Risk (11/16/2021)   Humiliation, Afraid, Rape, and Kick questionnaire    Fear of Current or Ex-Partner: No    Emotionally Abused: No    Physically Abused: No    Sexually Abused: No     Review of Systems    General:  No chills, fever, night sweats or weight changes.  Cardiovascular:  No chest pain, dyspnea on exertion, edema, orthopnea, palpitations, paroxysmal nocturnal dyspnea. Dermatological: No rash, lesions/masses Respiratory: No cough, dyspnea  Urologic: No hematuria, dysuria Abdominal:   No nausea, vomiting, diarrhea, bright red blood per rectum, melena, or hematemesis Neurologic:  No visual changes, wkns, changes in mental status. All other systems reviewed and are otherwise negative except as noted above.     Physical Exam    VS:  There were no vitals taken for this visit. , BMI There is no height or weight on file to calculate BMI.     GEN: Well nourished, well developed, in no acute distress. HEENT: normal. Neck: Supple, no JVD, carotid bruits, or masses. Cardiac: RRR, no murmurs, rubs, or gallops. No clubbing, cyanosis, edema.  Radials 2+/PT 2+ and equal bilaterally.  Respiratory:  Respirations regular and unlabored, clear to auscultation bilaterally. GI: Soft, nontender, nondistended, BS + x 4. MS: no deformity or atrophy. Skin:  warm and dry, no rash. Neuro:  Strength and sensation are intact. Psych: Normal affect.  Accessory Clinical Findings    ECG personally reviewed by me today- *** - No acute changes  Lab Results  Component Value Date   WBC 7.7 11/17/2021   HGB 13.5 11/17/2021   HCT 41.4 11/17/2021   MCV 87.7 11/17/2021   PLT 275 11/17/2021   Lab Results  Component Value Date   CREATININE 0.79 11/17/2021   BUN 9 11/17/2021   NA 140 11/17/2021   K 3.4 (L) 11/17/2021   CL 101 11/17/2021   CO2 28 11/17/2021   Lab Results  Component Value Date   ALT 22 08/07/2021   AST 29 08/07/2021   ALKPHOS 56 08/07/2021   BILITOT 0.8 08/07/2021   No results found for: "CHOL", "HDL", "LDLCALC", "LDLDIRECT", "TRIG", "CHOLHDL"  No results found for: "HGBA1C"  Assessment & Plan   1.  ***  Travelle Mcclimans, NP 12/28/2021, 10:23 AM

## 2021-12-29 ENCOUNTER — Ambulatory Visit: Payer: Medicare Other | Admitting: Cardiology

## 2022-01-09 ENCOUNTER — Ambulatory Visit: Payer: Medicare Other | Attending: Nurse Practitioner | Admitting: Cardiology

## 2022-01-09 ENCOUNTER — Encounter: Payer: Self-pay | Admitting: Cardiology

## 2022-01-09 VITALS — BP 102/58 | HR 65 | Ht 67.0 in | Wt 111.0 lb

## 2022-01-09 DIAGNOSIS — I10 Essential (primary) hypertension: Secondary | ICD-10-CM

## 2022-01-09 DIAGNOSIS — I471 Supraventricular tachycardia, unspecified: Secondary | ICD-10-CM

## 2022-01-09 NOTE — Progress Notes (Signed)
Cardiology Office Note:    Date:  01/09/2022   ID:  Samantha Saunders, DOB 22-Aug-1926, MRN [id number]  PCP:  [facility], [place] Providers Cardiologist:  Kate Sable, MD     Referring MD: Center, [facility]*   Chief Complaint  Patient presents with   New Patient (Initial Visit)    Monitor follow up, no new cardiac concerns     History of Present Illness:    Samantha Saunders is a 88 y.o. female with a hx of hypertension, Alzheimer's dementia presenting for follow-up.    Recently seen in the hospital due to weakness and fall.  Denies any head trauma or loss of consciousness.  Workup in the ED with troponin and EKG were unrevealing.  Cardiac monitor was placed to evaluate any significant arrhythmias.  BP low on admission, given IV fluids.  Cardiac monitor 12/19/2021 showed sinus rhythm, occasional SVTs, no sustained arrhythmias to suggest etiology of syncope.  Tachycardia noted during hospitalization, Lopressor was increased to 50 mg twice daily.  She was being treated for SIRS, possible UTI.  Echo 11/2019 showed normal systolic function.  EF 60 to 65%  Previously seen in the clinic in 2017 for dizziness associated with hypokalemia, UTI.    Past Medical History:  Diagnosis Date   Heart murmur    History of kidney infection    Hyperlipidemia    Hypertension    LBBB (left bundle branch block)    Syncope and collapse     Past Surgical History:  Procedure Laterality Date   ABDOMINAL HYSTERECTOMY      Current Medications: Current Meds  Medication Sig   aspirin 81 MG tablet Take 81 mg by mouth daily.   Cholecalciferol (D3 2000 PO) Take 2,000 Int'l Units/L by mouth daily.   ezetimibe (ZETIA) 10 MG tablet Take 10 mg by mouth daily.   metoprolol succinate (TOPROL-XL) 100 MG 24 hr tablet Take 100 mg by mouth daily.   [DISCONTINUED] lisinopril (ZESTRIL) 2.5 MG tablet Take 2.5 mg by mouth daily.     Allergies:   Statins   Social  History   Socioeconomic History   Marital status: Widowed    Spouse name: Not on file   Number of children: Not on file   Years of education: Not on file   Highest education level: Not on file  Occupational History   Not on file  Tobacco Use   Smoking status: Never    Passive exposure: Past   Smokeless tobacco: Never  Vaping Use   Vaping Use: Never used  Substance and Sexual Activity   Alcohol use: No   Drug use: No   Sexual activity: Not Currently    Birth control/protection: Post-menopausal  Other Topics Concern   Not on file  Social History Narrative   Not on file   Social Determinants of Health   Financial Resource Strain: Not on file  Food Insecurity: No Food Insecurity (11/16/2021)   Hunger Vital Sign    Worried About Running Out of Food in the Last Year: Never true    Ran Out of Food in the Last Year: Never true  Transportation Needs: No Transportation Needs (11/16/2021)   PRAPARE - Hydrologist (Medical): No    Lack of Transportation (Non-Medical): No  Physical Activity: Not on file  Stress: Not on file  Social Connections: Not on file     Family History: The patient's family history includes Hypertension in  her mother; Stroke in her mother.  ROS:   Please see the history of present illness.     All other systems reviewed and are negative.  EKGs/Labs/Other Studies Reviewed:    The following studies were reviewed today:   EKG:  EKG is  ordered today.  The ekg ordered today demonstrates normal sinus rhythm, sinus arrhythmia.  Recent Labs: 07/31/2021: TSH 2.679 08/07/2021: ALT 22; B Natriuretic Peptide 64.3; Magnesium 2.1 11/17/2021: BUN 9; Creatinine, Ser 0.79; Hemoglobin 13.5; Platelets 275; Potassium 3.4; Sodium 140  Recent Lipid Panel No results found for: "CHOL", "TRIG", "HDL", "CHOLHDL", "VLDL", "LDLCALC", "LDLDIRECT"   Risk Assessment/Calculations:             Physical Exam:    VS:  BP (!) 102/58 (BP Location:  Left Arm, Patient Position: Sitting, Cuff Size: Normal)   Pulse 65   Ht 5\' 7"  (1.702 m)   Wt 111 lb (50.3 kg)   SpO2 94%   BMI 17.39 kg/m     Wt Readings from Last 3 Encounters:  01/09/22 111 lb (50.3 kg)  11/16/21 119 lb 4.3 oz (54.1 kg)  09/26/21 111 lb (50.3 kg)     GEN:  Well nourished, well developed in no acute distress HEENT: Normal NECK: No JVD; No carotid bruits CARDIAC: RRR, no murmurs, rubs, gallops RESPIRATORY:  Clear to auscultation without rales, wheezing or rhonchi  ABDOMEN: Soft, non-tender, non-distended MUSCULOSKELETAL:  No edema; No deformity  SKIN: Warm and dry NEUROLOGIC:  Alert and oriented to person and place. PSYCHIATRIC:  Normal affect   ASSESSMENT:    1. Paroxysmal SVT (supraventricular tachycardia)   2. Primary hypertension    PLAN:    In order of problems listed above:  Paroxysmal SVT, dizziness.  Cardiac monitor with no sustained arrhythmias.  UTI might of contributed to symptoms.  Continue Toprol-XL 100 mg daily. Hypertension, BP low normal.  History of falls.  Stop lisinopril, monitor blood pressure.  If stays low, decrease Toprol-XL.  Follow-up in 3 months     Medication Adjustments/Labs and Tests Ordered: Current medicines are reviewed at length with the patient today.  Concerns regarding medicines are outlined above.  Orders Placed This Encounter  Procedures   EKG 12-Lead   No orders of the defined types were placed in this encounter.   Patient Instructions  Medication Instructions:  STOP the Lisinopril  *If you need a refill on your cardiac medications before your next appointment, please call your pharmacy*   Lab Work: None ordered If you have labs (blood work) drawn today and your tests are completely normal, you will receive your results only by: [place] (if you have MyChart) OR A paper copy in the mail If you have any lab test that is abnormal or we need to change your treatment, we will call you to review the  results.   Testing/Procedures: None ordered   Follow-Up: At [facility], you and your health needs are our priority.  As part of our continuing mission to provide you with exceptional heart care, we have created designated Provider Care Teams.  These Care Teams include your primary Cardiologist (physician) and Advanced Practice Providers (APPs -  Physician Assistants and Nurse Practitioners) who all work together to provide you with the care you need, when you need it.  We recommend signing up for the patient portal called "MyChart".  Sign up information is provided on this After Visit Summary.  MyChart is used to connect with patients for Virtual Visits (Telemedicine).  Patients are able to view lab/test results, encounter notes, upcoming appointments, etc.  Non-urgent messages can be sent to your provider as well.   To learn more about what you can do with MyChart, go to [email or web address].    Your next appointment:   3 month(s)  The format for your next appointment:   In Person  Provider:   You may see Debbe Odea, MD or one of the following Advanced Practice Providers on your designated Care Team:   Nicolasa Ducking, NP Eula Listen, PA-C Cadence Fransico Michael, PA-C Charlsie Quest, NP    Important Information About [place]         Signed, Debbe Odea, MD  01/09/2022 4:59 PM     HeartCare

## 2022-01-09 NOTE — Patient Instructions (Signed)
Medication Instructions:  STOP the Lisinopril  *If you need a refill on your cardiac medications before your next appointment, please call your pharmacy*   Lab Work: None ordered If you have labs (blood work) drawn today and your tests are completely normal, you will receive your results only by: MyChart Message (if you have MyChart) OR A paper copy in the mail If you have any lab test that is abnormal or we need to change your treatment, we will call you to review the results.   Testing/Procedures: None ordered   Follow-Up: At [facility], you and your health needs are our priority.  As part of our continuing mission to provide you with exceptional heart care, we have created designated Provider Care Teams.  These Care Teams include your primary Cardiologist (physician) and Advanced Practice Providers (APPs -  Physician Assistants and Nurse Practitioners) who all work together to provide you with the care you need, when you need it.  We recommend signing up for the patient portal called "MyChart".  Sign up information is provided on this After Visit Summary.  MyChart is used to connect with patients for Virtual Visits (Telemedicine).  Patients are able to view lab/test results, encounter notes, upcoming appointments, etc.  Non-urgent messages can be sent to your provider as well.   To learn more about what you can do with MyChart, go to [email or web address].    Your next appointment:   3 month(s)  The format for your next appointment:   In Person  Provider:   You may see Debbe Odea, MD or one of the following Advanced Practice Providers on your designated Care Team:   Nicolasa Ducking, NP Eula Listen, PA-C Cadence Fransico Michael, PA-C Charlsie Quest, NP    Important Information About [place]

## 2022-01-19 ENCOUNTER — Ambulatory Visit (INDEPENDENT_AMBULATORY_CARE_PROVIDER_SITE_OTHER): Payer: Medicare Other | Admitting: Obstetrics & Gynecology

## 2022-01-19 ENCOUNTER — Encounter: Payer: Self-pay | Admitting: Obstetrics & Gynecology

## 2022-01-19 VITALS — BP 120/8 | Ht 67.0 in | Wt 115.0 lb

## 2022-01-19 DIAGNOSIS — T8389XA Other specified complication of genitourinary prosthetic devices, implants and grafts, initial encounter: Secondary | ICD-10-CM | POA: Insufficient documentation

## 2022-01-19 DIAGNOSIS — N952 Postmenopausal atrophic vaginitis: Secondary | ICD-10-CM | POA: Diagnosis not present

## 2022-01-19 DIAGNOSIS — T839XXA Unspecified complication of genitourinary prosthetic device, implant and graft, initial encounter: Secondary | ICD-10-CM

## 2022-01-19 DIAGNOSIS — N898 Other specified noninflammatory disorders of vagina: Secondary | ICD-10-CM

## 2022-01-19 NOTE — Progress Notes (Signed)
   Established Patient Office Visit  Subjective   Patient ID: Samantha Saunders, female    DOB: [birth year]  Age: 89 y.o. MRN: [id number]  Chief Complaint  Patient presents with   Pessary Check    HPI   89 yo P4 here with vaginal bleeding for at least 3 months. She has been using a pessary for 10 + years. The last time she was seen here was 08/2020. She lives alone and manages her ADLs.  Objective:     BP (!) 120/8   Ht 5\' 7"  (1.702 m)   Wt 115 lb (52.2 kg)   BMI 18.01 kg/m    Physical Exam Well nourished, well hydrated Black female, no apparent distress Her daughter is here with her at the exam. I removed her #2 ring and cleaned it. I examined her vagina with a speculum. The side walls were bloody with erosions.   Assessment & Plan:  Vaginal erosions from long term pessary use Prolapse of vaginal vault VVA, severe   I have given her samples of Imvexxy 4 mcg to be used every other night. She will come back in 3-4 weeks for a re check. No pessary until then. Problem List Items Addressed This Visit   None   No follow-ups on file.    Emily Filbert, MD

## 2022-02-06 ENCOUNTER — Ambulatory Visit (INDEPENDENT_AMBULATORY_CARE_PROVIDER_SITE_OTHER): Payer: Medicare Other | Admitting: Obstetrics & Gynecology

## 2022-02-06 DIAGNOSIS — N993 Prolapse of vaginal vault after hysterectomy: Secondary | ICD-10-CM

## 2022-02-06 DIAGNOSIS — N816 Rectocele: Secondary | ICD-10-CM | POA: Diagnosis not present

## 2022-02-06 NOTE — Progress Notes (Signed)
   Established Patient Office Visit  Subjective   Patient ID: Samantha Saunders, female    DOB: [birth year]  Age: 89 y.o. MRN: [id number]  Chief Complaint  Patient presents with   Pessary Maintenance     HPI    89 yo P4 here for re check of her vagina. I noted extensive vaginal erosions when I removed her pessary. It had been in place since 08/2020.  She has used 4 mcg of Imvexxy several times since I saw her 2 1/2 weeks ago.  She has not had any problems with bowel movements, pain or pressure since not having the pessary in. In fact, she is happier without the pessary than with it.   Objective:     There were no vitals taken for this visit.   Physical Exam Well nourished, well hydrated Black female, no apparent distress She is ambulating and conversing normally.  EG- moderate VVA Spec exam reveals that the vaginal erosions have healed. She has a grade 4 rectocele.  Assessment & Plan:   Problem List Items Addressed This Visit     Prolapse of vaginal vault after hysterectomy - Primary (Chronic)   Rectocele   At this time she prefers to not use the pessary. She will come back in a year/prn sooner.   Emily Filbert, MD

## 2022-04-13 ENCOUNTER — Ambulatory Visit: Payer: Medicare Other | Admitting: Cardiology

## 2022-05-08 ENCOUNTER — Ambulatory Visit: Payer: Medicare Other | Attending: Cardiology | Admitting: Cardiology

## 2022-05-08 ENCOUNTER — Encounter: Payer: Self-pay | Admitting: Cardiology

## 2022-05-08 VITALS — BP 180/80 | HR 81 | Wt 115.0 lb

## 2022-05-08 DIAGNOSIS — I10 Essential (primary) hypertension: Secondary | ICD-10-CM

## 2022-05-08 DIAGNOSIS — R011 Cardiac murmur, unspecified: Secondary | ICD-10-CM | POA: Diagnosis not present

## 2022-05-08 DIAGNOSIS — I471 Supraventricular tachycardia, unspecified: Secondary | ICD-10-CM | POA: Diagnosis not present

## 2022-05-08 DIAGNOSIS — E782 Mixed hyperlipidemia: Secondary | ICD-10-CM

## 2022-05-08 MED ORDER — LISINOPRIL 2.5 MG PO TABS: ORAL_TABLET | ORAL | 1 refills | Status: DC

## 2022-05-08 NOTE — Patient Instructions (Signed)
Medication Instructions:  START lisinopril 2.5 mg daily as needed for systolic (top number) blood pressure greater than 150.  *If you need a refill on your cardiac medications before your next appointment, please call your pharmacy*  Lab Work: No labs ordered  If you have labs (blood work) drawn today and your tests are completely normal, you will receive your results only by: MyChart Message (if you have MyChart) OR A paper copy in the mail If you have any lab test that is abnormal or we need to change your treatment, we will call you to review the results.  Testing/Procedures: No testing ordered  Follow-Up: At [facility], you and your health needs are our priority.  As part of our continuing mission to provide you with exceptional heart care, we have created designated Provider Care Teams.  These Care Teams include your primary Cardiologist (physician) and Advanced Practice Providers (APPs -  Physician Assistants and Nurse Practitioners) who all work together to provide you with the care you need, when you need it.  We recommend signing up for the patient portal called "MyChart".  Sign up information is provided on this After Visit Summary.  MyChart is used to connect with patients for Virtual Visits (Telemedicine).  Patients are able to view lab/test results, encounter notes, upcoming appointments, etc.  Non-urgent messages can be sent to your provider as well.   To learn more about what you can do with MyChart, go to [email or web address].    Your next appointment:   3 month(s)  Provider:   Charlsie Quest, NP

## 2022-05-08 NOTE — Progress Notes (Signed)
Cardiology Office Note:   Date:  05/08/2022  ID:  Samantha Saunders, DOB 01/23/1926, MRN [id number]  History of Present Illness:   Samantha Saunders is a 89 y.o. female with a past medical history of hypertension, Alzheimer's dementia, paroxysmal SVT, heart murmur, hyperlipidemia, chronic LBBB, history of syncope with collapse, who is here today for follow up.  She had previously been in the hospital due to weakness and a fall.  Denid any head trauma or loss of consciousness.  Workup in the ED with high-sensitivity troponin and EKG were unrevealing.  Cardiac monitor was placed to evaluate for significant arrhythmias which revealed sinus rhythm, occasional SVT, no sustained arrhythmia to suggest etiology of syncope, tachycardia noted during hospitalization Lopressor was increased to 50 mg twice daily.  Previous echocardiogram in 11/2019 showed normal systolic function EF of 60 to 40%.  She was last seen in clinic by Dr Azucena Cecil 01/09/22 to follow up on her cardiac monitor results. She was continued on Toprol XL 100 mg daily and lisinopril was discontinued.   She returns to clinic today accompanied by her daughter.  The daughter had concerns as patient gets dizzy if her blood sugar is low.  Her blood pressure has been fluctuating as well at home 150s to 170s.  It is hard to determine if she is symptomatic with her high blood pressures as she has complaints of the same symptoms with high or low blood pressure.  Previously her lisinopril was discontinued because she was hypotensive during hospitalization and continued with soft blood pressures on return.  She denies any chest pain, shortness of breath, peripheral edema.  She continues to have some dizziness or lightheadedness, either low or extremely elevated blood pressures and changes in blood sugar.  She typically sleeps until around noon and eats cereal and has personal care aides at home who are available to take her blood pressure as well.  She has not had  any further emergency department visits or hospitalizations.  ROS: 10 point review of systems completed and considered negative with exception of what is listed in the HPI.  Studies Reviewed:    EKG: No new tracings were completed today  Heart Monitor 12/19/21 Patient had a min HR of 46 bpm, max HR of 193 bpm, and avg HR of 67 bpm. Predominant underlying rhythm was Sinus Rhythm. Bundle Branch Block/IVCD was present.  53 Supraventricular Tachycardia runs occurred, the run with the fastest interval lasting 7 beats with a max rate of 193 bpm, the longest lasting 11.5 secs with an avg rate of 120 bpm.  Occasional PACs with a burden of 4% and occasional PVCs with a burden of 1.4%.    TTE 12/05/19 1. Left ventricular ejection fraction, by estimation, is 60 to 65%. The  left ventricle has normal function. The left ventricle has no regional  wall motion abnormalities. Left ventricular diastolic parameters were  normal.   2. Right ventricular systolic function is normal. The right ventricular  size is normal.   3. The mitral valve is normal in structure. Mild mitral valve  regurgitation.   4. The aortic valve is normal in structure. Aortic valve regurgitation is  mild. Mild to moderate aortic valve sclerosis/calcification is present,  without any evidence of aortic stenosis.    Risk Assessment/Calculations:     HYPERTENSION CONTROL Vitals:   05/08/22 1329 05/08/22 1342  BP: (!) 200/94 (!) 180/80    The patient's blood pressure is elevated above target today.  In order to address the  patient's elevated BP: A new medication was prescribed today.           Physical Exam:   VS:  BP (!) 180/80 (BP Location: Left Arm, Patient Position: Sitting, Cuff Size: Normal)   Pulse 81   Wt 115 lb (52.2 kg)   SpO2 94%   BMI 18.01 kg/m    Wt Readings from Last 3 Encounters:  05/08/22 115 lb (52.2 kg)  01/19/22 115 lb (52.2 kg)  01/09/22 111 lb (50.3 kg)     GEN: Well nourished, well  developed in no acute distress NECK: No JVD; No carotid bruits CARDIAC: RRR, II/VI systolic murmur, without rubs, gallops RESPIRATORY:  Clear to auscultation without rales, wheezing or rhonchi  ABDOMEN: Soft, non-tender, non-distended EXTREMITIES:  No edema; No deformity   ASSESSMENT AND PLAN:   Paroxysmal SVT with cardiac monitor with no sustained arrhythmias.  She still occasionally has some dizziness but when checking her blood pressure does not have elevated heart rates.  She is continued on Toprol-XL 100 mg daily.  Uncontrolled hypertension with blood pressure today of 200/94 and recheck blood pressure was 180/80.  She is already on metoprolol approximately 2 hours prior.  Previously her lisinopril was discontinued.  Her daughter is noted at home that she continues to have elevated blood pressures in the 150s and 170s.  She has been given a prescription for lisinopril 2.5 mg to take for systolic blood pressure greater than 150.  Encouraged to continue to monitor pressures at home 1 to 2 hours after medication.  Heart murmur on exam with previous echocardiogram in 2021 revealed mild mitral regurgitation and mild to moderate aortic valve sclerosis without stenosis.  Continue with surveillance monitoring.  Mixed hyperlipidemia with a known statin intolerance.  She is continued on Zetia 10 mg daily.  Disposition patient return to clinic to see MD/APP in 3 months or sooner if needed.        Signed, Aero Drummonds, NP

## 2022-05-23 ENCOUNTER — Telehealth: Payer: Self-pay | Admitting: Cardiology

## 2022-05-23 NOTE — Telephone Encounter (Signed)
Spoke with patient's daughter Malachi Bonds and informed her of the provider's recommendations as follows:  "It can be increased to 5 mg as needed for SBP greater than 150 mmHg. Continue to monitor blood pressures as they have been doing. It can be a component of anxiety since her daughter is out of town."  The patient's POA was satisfied with the response and read back instructions.

## 2022-05-23 NOTE — Telephone Encounter (Signed)
It can be increased to 5 mg as needed for SBP greater than 150 mmHg. Continue to monitor blood pressures as they have been doing. It can be a component of anxiety since her daughter is out of town.

## 2022-05-23 NOTE — Telephone Encounter (Signed)
Pt c/o medication issue:  1. Name of Medication:   lisinopril (ZESTRIL) 2.5 MG tablet    2. How are you currently taking this medication (dosage and times per day)? As written  3. Are you having a reaction (difficulty breathing--STAT)? no  4. What is your medication issue? Pt daughter called in stating pt bp is still high after starting this med, she wants to know if it can be increased. She also wanted to add that she is out of town and it seems to have taken an emotional toll on the pt.   Before meds @ 5:00pm 5/7: 179/91 After meds @8 :00pm 5/7: 173/81   5/6 @ 5:00pm: 151/75  5/5: 155/84 5/5: 10 min later 143/78   5/3: 140/86

## 2022-05-25 ENCOUNTER — Emergency Department
Admission: EM | Admit: 2022-05-25 | Discharge: 2022-05-25 | Disposition: A | Discharge status: Home / Self Care | Payer: Medicare Other | Attending: Emergency Medicine | Admitting: Emergency Medicine

## 2022-05-25 ENCOUNTER — Other Ambulatory Visit: Payer: Self-pay

## 2022-05-25 ENCOUNTER — Encounter: Payer: Self-pay | Admitting: Emergency Medicine

## 2022-05-25 DIAGNOSIS — R519 Headache, unspecified: Secondary | ICD-10-CM | POA: Diagnosis present

## 2022-05-25 DIAGNOSIS — I10 Essential (primary) hypertension: Secondary | ICD-10-CM | POA: Insufficient documentation

## 2022-05-25 DIAGNOSIS — Z79899 Other long term (current) drug therapy: Secondary | ICD-10-CM | POA: Diagnosis not present

## 2022-05-25 LAB — BASIC METABOLIC PANEL
Anion gap: 13 (ref 5–15)
BUN: 15 mg/dL (ref 8–23)
CO2: 25 mmol/L (ref 22–32)
Calcium: 9.4 mg/dL (ref 8.9–10.3)
Chloride: 99 mmol/L (ref 98–111)
Creatinine, Ser: 0.93 mg/dL (ref 0.44–1.00)
GFR, Estimated: 57 mL/min — ABNORMAL LOW (ref >60)
Glucose, Bld: 85 mg/dL (ref 70–99)
Potassium: 3.9 mmol/L (ref 3.5–5.1)
Sodium: 137 mmol/L (ref 135–145)

## 2022-05-25 LAB — CBC
HCT: 41.5 % (ref 36.0–46.0)
Hemoglobin: 13.3 g/dL (ref 12.0–15.0)
MCH: 29.6 pg (ref 26.0–34.0)
MCHC: 32 g/dL (ref 30.0–36.0)
MCV: 92.4 fL (ref 80.0–100.0)
Platelets: 208 10*3/uL (ref 150–400)
RBC: 4.49 MIL/uL (ref 3.87–5.11)
RDW: 14.1 % (ref 11.5–15.5)
WBC: 5.7 10*3/uL (ref 4.0–10.5)
nRBC: 0 % (ref 0.0–0.2)

## 2022-05-25 MED ORDER — LISINOPRIL 10 MG PO TABS
10.0000 mg | ORAL_TABLET | Freq: Once | ORAL | Status: AC
  Administered 2022-05-25: 10 mg via ORAL
  Filled 2022-05-25: qty 1

## 2022-05-25 NOTE — ED Triage Notes (Signed)
Pt via POV from [facility] Pt was sent over from the walk in clinic for hypertension states she also has headache. Pt has a hx of HTN and does take medication. Pt is A&OX4 and NAD

## 2022-05-25 NOTE — ED Provider Notes (Signed)
[facility] Provider Note    Event Date/Time   First MD Initiated Contact with Patient 05/25/22 1825     (approximate)   History   Hypertension   HPI  Samantha Saunders is a 89 y.o. female with history of high blood pressure presents with elevated blood pressures.  Patient brought in by son for evaluation.  He reports blood pressure has been elevated for nearly a week.  Review of record demonstrates patient was seen by cardiologist on April 23, had elevated blood pressures then as well.  Lisinopril was restarted and then later in the week increase to 5 mg.  Patient does take Toprol-XL 100 mg.  She is asymptomatic and overall well-appearing     Physical Exam   Triage Vital Signs: ED Triage Vitals  Enc Vitals Group     BP 05/25/22 1719 (!) 216/97     Pulse Rate 05/25/22 1719 (!) 59     Resp 05/25/22 1719 20     Temp 05/25/22 1719 97.8 F (36.6 C)     Temp Source 05/25/22 1719 Oral     SpO2 05/25/22 1719 98 %     Weight --      Height 05/25/22 1720 1.702 m (5\' 7" )     Head Circumference --      Peak Flow --      Pain Score 05/25/22 1720 5     Pain Loc --      Pain Edu? --      Excl. in GC? --     Most recent vital signs: Vitals:   05/25/22 1925 05/25/22 2016  BP: (!) 210/84 (!) 199/94  Pulse: 72 65  Resp: 18 16  Temp:    SpO2: 98% 100%     General: Awake, no distress.  CV:  Good peripheral perfusion.  Resp:  Normal effort.  Abd:  No distention.  Other:     ED Results / Procedures / Treatments   Labs (all labs ordered are listed, but only abnormal results are displayed) Labs Reviewed  BASIC METABOLIC PANEL - Abnormal; Notable for the following components:      Result Value   GFR, Estimated 57 (*)    All other components within normal limits  CBC     EKG     RADIOLOGY     PROCEDURES:  Critical Care performed:   Procedures   MEDICATIONS ORDERED IN ED: Medications  lisinopril (ZESTRIL) tablet 10 mg (10 mg Oral  Given 05/25/22 1847)     IMPRESSION / MDM / ASSESSMENT AND PLAN / ED COURSE  I reviewed the triage vital signs and the nursing notes. Patient's presentation is most consistent with exacerbation of chronic illness.  Patient presents with elevated blood pressure as detailed above, only recently started 2.5 mg of lisinopril which is quite a low dose, increase to 5 mg a few days ago.  Blood pressures here over 200 systolic however she is a symptomatic and well-appearing  Lab work reviewed and is unremarkable.  Will give 10 mg of p.o. lisinopril, counseled patient on close outpatient follow-up and likely need for medication adjustment.  No indication for inpatient admission at this time  Blood pressure trending down after p.o. medications, appropriate for close outpatient follow-up      FINAL CLINICAL IMPRESSION(S) / ED DIAGNOSES   Final diagnoses:  Primary hypertension     Rx / DC Orders   ED Discharge Orders     None  Note:  This document was prepared using Dragon voice recognition software and may include unintentional dictation errors.   Jene Every, MD 05/25/22 2037

## 2022-05-28 ENCOUNTER — Telehealth: Payer: Self-pay | Admitting: Cardiology

## 2022-05-28 NOTE — Telephone Encounter (Signed)
Pt c/o medication issue:  1. Name of Medication: lisinopril (ZESTRIL) 2.5 MG tablet   2. How are you currently taking this medication (dosage and times per day)? As needed for systolic blood pressures greater than 150   3. Are you having a reaction (difficulty breathing--STAT)? No  4. What is your medication issue? Patient daughter stated that the patient had to go to the ED on Friday. Patient's daughter stated that when she went to the ED the patient's BP was at 216/97. Patient daughter stated that the ED told the patient to increase the dosage to 10MG . Patient's daughter stated that since Saturday the patient has been taking the 10MG  in the morning and it has helped her BP. Patient's daughter stated that yesterday she checked it 3 times and the top number was reading at 164, 148, and 134. Patient's daughter was told by the ED to speak with [place] about what we recommend for the patient from here. Please advise.

## 2022-05-29 MED ORDER — LISINOPRIL 10 MG PO TABS: 10.0000 mg | ORAL_TABLET | Freq: Every day | ORAL | 3 refills | Status: DC

## 2022-05-29 NOTE — Telephone Encounter (Signed)
Patient's daughter is calling to follow up. Patient's daughter stated that the patient only has two more days left of BP medicine. Please advise.

## 2022-05-29 NOTE — Telephone Encounter (Signed)
I called and spoke with daughter Aaira Bodily) the patient was seen in the ED over the weekend for elevated blood pressure. The ED recommended taking 10 mg of Lisinopril daily for blood pressure. The daughter states that they have been doing that since she was seen in the ED and that her blood pressure has been better controlled but the systolic has still been in the 150's at times. The daughter is asking if the patient should continue to take 10 mg of Lisinopril daily.  Will forward to Charlsie Quest , NP for review.

## 2022-05-29 NOTE — Telephone Encounter (Signed)
Called daughter Lettye Bohls and told her the below recommendations from Charlsie Quest, NP of taking Lisinopril 10 mg daily and to continue to monitor blood pressures. Daughter verbalized understating. Prescription sent to preferred pharmacy.    Charlsie Quest, NP     05/29/22 11:21 AM  Yes continue with the 10 mg daily dosing. Continue to monitor blood pressures at home as they have previously been doing.

## 2022-07-29 ENCOUNTER — Emergency Department: Payer: Medicare Other

## 2022-07-29 ENCOUNTER — Other Ambulatory Visit: Payer: Self-pay

## 2022-07-29 ENCOUNTER — Encounter: Payer: Self-pay | Admitting: Radiology

## 2022-07-29 ENCOUNTER — Emergency Department
Admission: EM | Admit: 2022-07-29 | Discharge: 2022-07-30 | Disposition: A | Discharge status: Home / Self Care | Payer: Medicare Other | Attending: Emergency Medicine | Admitting: Emergency Medicine

## 2022-07-29 DIAGNOSIS — I498 Other specified cardiac arrhythmias: Secondary | ICD-10-CM | POA: Insufficient documentation

## 2022-07-29 DIAGNOSIS — M4802 Spinal stenosis, cervical region: Secondary | ICD-10-CM | POA: Insufficient documentation

## 2022-07-29 DIAGNOSIS — M47812 Spondylosis without myelopathy or radiculopathy, cervical region: Secondary | ICD-10-CM | POA: Insufficient documentation

## 2022-07-29 DIAGNOSIS — E785 Hyperlipidemia, unspecified: Secondary | ICD-10-CM | POA: Insufficient documentation

## 2022-07-29 DIAGNOSIS — I10 Essential (primary) hypertension: Secondary | ICD-10-CM | POA: Diagnosis not present

## 2022-07-29 DIAGNOSIS — R531 Weakness: Secondary | ICD-10-CM | POA: Insufficient documentation

## 2022-07-29 DIAGNOSIS — I447 Left bundle-branch block, unspecified: Secondary | ICD-10-CM | POA: Diagnosis not present

## 2022-07-29 LAB — COMPREHENSIVE METABOLIC PANEL
ALT: 23 U/L (ref 0–44)
AST: 29 U/L (ref 15–41)
Albumin: 4.3 g/dL (ref 3.5–5.0)
Alkaline Phosphatase: 46 U/L (ref 38–126)
Anion gap: 8 (ref 5–15)
BUN: 17 mg/dL (ref 8–23)
CO2: 27 mmol/L (ref 22–32)
Calcium: 9.3 mg/dL (ref 8.9–10.3)
Chloride: 101 mmol/L (ref 98–111)
Creatinine, Ser: 0.75 mg/dL (ref 0.44–1.00)
GFR, Estimated: >60 mL/min (ref >60)
Glucose, Bld: 113 mg/dL — ABNORMAL HIGH (ref 70–99)
Potassium: 3.8 mmol/L (ref 3.5–5.1)
Sodium: 136 mmol/L (ref 135–145)
Total Bilirubin: 1 mg/dL (ref 0.3–1.2)
Total Protein: 7.8 g/dL (ref 6.5–8.1)

## 2022-07-29 LAB — DIFFERENTIAL
Abs Immature Granulocytes: 0 10*3/uL (ref 0.00–0.07)
Basophils Absolute: 0 10*3/uL (ref 0.0–0.1)
Basophils Relative: 1 %
Eosinophils Absolute: 0.1 10*3/uL (ref 0.0–0.5)
Eosinophils Relative: 2 %
Immature Granulocytes: 0 %
Lymphocytes Relative: 48 %
Lymphs Abs: 2.9 10*3/uL (ref 0.7–4.0)
Monocytes Absolute: 0.6 10*3/uL (ref 0.1–1.0)
Monocytes Relative: 10 %
Neutro Abs: 2.3 10*3/uL (ref 1.7–7.7)
Neutrophils Relative %: 39 %

## 2022-07-29 LAB — PROTIME-INR
INR: 1 (ref 0.8–1.2)
Prothrombin Time: 13.3 seconds (ref 11.4–15.2)

## 2022-07-29 LAB — APTT: aPTT: 33 seconds (ref 24–36)

## 2022-07-29 LAB — CBC
HCT: 39.5 % (ref 36.0–46.0)
Hemoglobin: 12.6 g/dL (ref 12.0–15.0)
MCH: 30 pg (ref 26.0–34.0)
MCHC: 31.9 g/dL (ref 30.0–36.0)
MCV: 94 fL (ref 80.0–100.0)
Platelets: 218 10*3/uL (ref 150–400)
RBC: 4.2 MIL/uL (ref 3.87–5.11)
RDW: 14.2 % (ref 11.5–15.5)
WBC: 5.9 10*3/uL (ref 4.0–10.5)
nRBC: 0 % (ref 0.0–0.2)

## 2022-07-29 LAB — ETHANOL: Alcohol, Ethyl (B): <10 mg/dL (ref <10)

## 2022-07-29 MED ORDER — SODIUM CHLORIDE 0.9% FLUSH: 3.0000 mL | Freq: Once | INTRAVENOUS | Status: DC

## 2022-07-29 NOTE — ED Triage Notes (Signed)
Pt comes with c/o right arm weakness that started today when she woke up. Pt  states it wasn't that bad at first but has gotten worse. Pt states no slurring speech, blurry vision or dizziness. Pt answering questions correctly.   Pt states she just feels weak. Pt is not on thinners

## 2022-07-29 NOTE — ED Provider Notes (Signed)
[facility] Provider Note    Event Date/Time   First MD Initiated Contact with Patient 07/29/22 2043     (approximate)  History   Chief Complaint: Weakness  HPI  Samantha Saunders is a 89 y.o. female with a past medical history of hypertension, hyperlipidemia, presents to the emergency department for right upper extremity weakness.  According to the patient since awakening this morning she has felt weakness in the right upper extremity.  Denies any sensory deficits denies any speech difficulty denies any headache.  She has no other complaints besides mild weakness in the right upper extremity.  No history of CVA.  Physical Exam   Triage Vital Signs: ED Triage Vitals  Encounter Vitals Group     BP 07/29/22 1624 (!) 205/99     Systolic BP Percentile --      Diastolic BP Percentile --      Pulse Rate 07/29/22 1624 69     Resp 07/29/22 1624 18     Temp 07/29/22 1624 98 F (36.7 C)     Temp src --      SpO2 07/29/22 1624 100 %     Weight --      Height --      Head Circumference --      Peak Flow --      Pain Score 07/29/22 1622 3     Pain Loc --      Pain Education --      Exclude from Growth Chart --     Most recent vital signs: Vitals:   07/29/22 1624 07/29/22 2053  BP: (!) 205/99 (!) 205/83  Pulse: 69 66  Resp: 18   Temp: 98 F (36.7 C)   SpO2: 100% 100%    General: Awake, no distress.  CV:  Good peripheral perfusion.  Regular rate and rhythm  Resp:  Normal effort.  Equal breath sounds bilaterally.  Abd:  No distention.  Soft, nontender.  No rebound or guarding. Other:  Equal grip strength bilaterally.  5/5 strength in all extremities.  No pronator drift.  Cranial nerves intact.  Normal neurologic exam.   ED Results / Procedures / Treatments   EKG  EKG viewed and interpreted by myself shows a normal sinus rhythm at 65 bpm with a slightly widened QRS, normal axis, largely normal intervals with nonspecific ST changes.  RADIOLOGY  I  reviewed and interpreted the CT head images.  No bleeding seen on my evaluation. Radiology is read the CT scan as negative for acute abnormality.   MEDICATIONS ORDERED IN ED: Medications  sodium chloride flush (NS) 0.9 % injection 3 mL (has no administration in time range)     IMPRESSION / MDM / ASSESSMENT AND PLAN / ED COURSE  I reviewed the triage vital signs and the nursing notes.  Patient's presentation is most consistent with acute presentation with potential threat to life or bodily function.  Patient presents to the emergency department for right upper extremity weakness since awakening this morning.  No appreciable weakness on exam.  Reassuring neurological exam.  CBC is normal, chemistry is normal.  CT scan shows no concerning findings.  However given the patient's reported subjective weakness in the right upper extremity although reassuring neurological exam we will proceed with an MRI of the brain to rule out CVA.  Patient and daughter are agreeable to this plan of care. Patient's MRI is negative.  Patient remains hypertensive daughter states patient's blood pressure has been up  and down at home.  We will have the patient follow-up with her doctor.  FINAL CLINICAL IMPRESSION(S) / ED DIAGNOSES   Right upper extremity weakness   Note:  This document was prepared using Dragon voice recognition software and may include unintentional dictation errors.   Minna Antis, MD 07/29/22 2316

## 2022-07-29 NOTE — Discharge Instructions (Signed)
Your workup in the emergency department has shown normal results.  Please take your blood pressure medications as normal and follow-up with your doctor in 1 to 2 days for recheck of your blood pressure.  Your MRI today in the emergency department is normal.

## 2022-08-14 ENCOUNTER — Ambulatory Visit: Payer: Medicare Other | Attending: Cardiology | Admitting: Cardiology

## 2022-08-14 ENCOUNTER — Encounter: Payer: Self-pay | Admitting: Cardiology

## 2022-08-14 VITALS — BP 168/88 | HR 79 | Ht 66.0 in | Wt 112.4 lb

## 2022-08-14 DIAGNOSIS — I471 Supraventricular tachycardia, unspecified: Secondary | ICD-10-CM | POA: Diagnosis not present

## 2022-08-14 DIAGNOSIS — Z79899 Other long term (current) drug therapy: Secondary | ICD-10-CM | POA: Diagnosis not present

## 2022-08-14 DIAGNOSIS — I10 Essential (primary) hypertension: Secondary | ICD-10-CM | POA: Diagnosis not present

## 2022-08-14 DIAGNOSIS — E782 Mixed hyperlipidemia: Secondary | ICD-10-CM

## 2022-08-14 DIAGNOSIS — R011 Cardiac murmur, unspecified: Secondary | ICD-10-CM

## 2022-08-14 DIAGNOSIS — I4892 Unspecified atrial flutter: Secondary | ICD-10-CM | POA: Diagnosis not present

## 2022-08-14 MED ORDER — LISINOPRIL 20 MG PO TABS: 20.0000 mg | ORAL_TABLET | Freq: Every day | ORAL | 3 refills | Status: DC

## 2022-08-14 NOTE — Patient Instructions (Signed)
Medication Instructions:   Increase: Lisinopril 20 mg once daily   *If you need a refill on your cardiac medications before your next appointment, please call your pharmacy*   Lab Work: Your provider would like for you to return in 2 weeks to have the following labs drawn: BMP.   Please go to [facility] [address] ([facility] [address], [place] [address] You do not need an appointment.  They are open from 7:30 am-4 pm.  Lunch from 1:00 pm- 2:00 pm You don't need to be fasting.   You may also go to any of these [facility] locations:  [employer]  - [address] [employer] [address] Chief Technology Officer)     If you have labs (blood work) drawn today and your tests are completely normal, you will receive your results only by: [employer] (if you have MyChart) OR A paper copy in the mail If you have any lab test that is abnormal or we need to change your treatment, we will call you to review the results.   Testing/Procedures: none   Follow-Up: At [facility], you and your health needs are our priority.  As part of our continuing mission to provide you with exceptional heart care, we have created designated Provider Care Teams.  These Care Teams include your primary Cardiologist (physician) and Advanced Practice Providers (APPs -  Physician Assistants and Nurse Practitioners) who all work together to provide you with the care you need, when you need it.  We recommend signing up for the patient portal called "MyChart".  Sign up information is provided on this After Visit Summary.  MyChart is used to connect with patients for Virtual Visits (Telemedicine).  Patients are able to view lab/test results, encounter notes, upcoming appointments, etc.  Non-urgent messages can be sent to your provider as well.   To learn more about what you can do with MyChart, go to [email or web address].    Your next appointment:   3 month(s)  Provider:   You may  see Debbe Odea, MD or one of the following Advanced Practice Providers on your designated Care Team:   Charlsie Quest, NP

## 2022-08-14 NOTE — Progress Notes (Signed)
Cardiology Office Note:  .   Date:  08/14/2022  ID:  Samantha Saunders, DOB 08/03/1926, MRN [id number] PCP: Center, [facility]  [facility] Providers Cardiologist:  Debbe Odea, MD    History of Present Illness: .   Samantha Saunders is a 89 y.o. female with past medical history of hypertension, Alzheimer's dementia, paroxysmal SVT, heart murmur, hyperlipidemia, chronic left bundle branch block, history of syncope with collapse, who is here today for follow-up after recent emergency department visit for right arm weakness.  Previously been evaluated in the emergency department with high-sensitivity troponins and EKG being unrevealing.  Cardiac monitor was placed to evaluate for significant arrhythmias which revealed sinus rhythm with occasional SVT, no sustained arrhythmias to suggest etiology of syncope, tachycardia noted during hospitalization.  Lopressor was increased to 50 mg twice daily.  Previous echocardiogram in 11/2019 showed normal systolic function with with an EF of 60 to 65%.   She was evaluated at the [facility] emergency department 05/25/2022 with hypertension.  She presented to the emergency department blood pressure 216/97, pulse of 59, respirations of 20, temperature 97.8.  Lab work reviewed and was unremarkable.  Lisinopril was increased to 10 mg daily she was counseled on close outpatient follow-up and the likely need for medication adjustment.  There was no indication for inpatient admission at the time.  Blood pressure was trending down after initial medications were given.  She was subsequently discharged home.  She presented back to the [facility] emergency department 07/29/2022 with complaint of weakness.  According to the patient since awakening in the morning she had felt weakness in the right upper extremity.  Denies any sensory defects or speech difficulties or any headache.  She had no other complaints but some mild weakness of the right upper extremity and there  was no history of CVA.  Blood pressure was 205/99, pulse 69, respirations 18, temperature 98.0.  CT of the head was completed which was negative for any acute abnormality.  Reassuring neurological exam.  CBC and chemistry were normal.  CT scans did not concerning findings however given the patient's reported subjective weakness in the right upper extremity although reassuring neurological exam today we will proceed with an MRI of the brain to rule out a CVA.  Patient's MRI was negative.  Patient remained hypertensive and daughter states patient blood pressure has been up and at home.  Patient was to follow-up with provider.  She returns to clinic today stating that she has been doing well well without any complaints.  She denies chest pain, shortness of breath, peripheral edema, headache, dizziness, lightheadedness or weakness to any extremity.  She is unaware that her blood pressure has been running high.  There is no family in the room with her today.  She has not had any recurrent emergency department visits or recent hospitalizations since her 07/29/2022 visit.  ROS: 10 point review of system has been completed and considered negative with exception of what is been listed in HPI  Studies Reviewed: Marland Kitchen   EKG Interpretation Date/Time:  Tuesday August 14 2022 15:49:50 EDT Ventricular Rate:  79 PR Interval:  152 QRS Duration:  124 QT Interval:  424 QTC Calculation: 486 R Axis:   -1  Text Interpretation: Normal sinus rhythm with sinus arrhythmia Possible Left atrial enlargement Left bundle branch block When compared with ECG of 29-Jul-2022 16:27, Questionable change in QRS axis ST now depressed in Lateral leads Confirmed by Charlsie Quest ([id number]) on 08/14/2022 3:54:57 PM  Heart Monitor 12/19/21 Patient had a min HR of 46 bpm, max HR of 193 bpm, and avg HR of 67 bpm. Predominant underlying rhythm was Sinus Rhythm. Bundle Branch Block/IVCD was present.  53 Supraventricular Tachycardia runs occurred, the  run with the fastest interval lasting 7 beats with a max rate of 193 bpm, the longest lasting 11.5 secs with an avg rate of 120 bpm.  Occasional PACs with a burden of 4% and occasional PVCs with a burden of 1.4%.   TTE 12/05/19 1. Left ventricular ejection fraction, by estimation, is 60 to 65%. The  left ventricle has normal function. The left ventricle has no regional  wall motion abnormalities. Left ventricular diastolic parameters were  normal.   2. Right ventricular systolic function is normal. The right ventricular  size is normal.   3. The mitral valve is normal in structure. Mild mitral valve  regurgitation.   4. The aortic valve is normal in structure. Aortic valve regurgitation is  mild. Mild to moderate aortic valve sclerosis/calcification is present,  without any evidence of aortic stenosis.    Risk Assessment/Calculations:     HYPERTENSION CONTROL Vitals:   08/14/22 1542 08/14/22 1604  BP: (!) 170/80 (!) 168/88    The patient's blood pressure is elevated above target today.  In order to address the patient's elevated BP: A current anti-hypertensive medication was adjusted today.          Physical Exam:   VS:  BP (!) 168/88 (BP Location: Left Arm, Patient Position: Sitting, Cuff Size: Normal)   Pulse 79   Ht 5\' 6"  (1.676 m)   Wt 112 lb 6.4 oz (51 kg)   SpO2 97%   BMI 18.14 kg/m    Wt Readings from Last 3 Encounters:  08/14/22 112 lb 6.4 oz (51 kg)  05/08/22 115 lb (52.2 kg)  01/19/22 115 lb (52.2 kg)    GEN: Well nourished, well developed in no acute distress NECK: No JVD; No carotid bruits CARDIAC: RRR, II/VI systolic murmur,without rubs or gallops RESPIRATORY:  Clear to auscultation without rales, wheezing or rhonchi  ABDOMEN: Soft, non-tender, non-distended EXTREMITIES:  No edema; No deformity   ASSESSMENT AND PLAN: .   Hypertension with blood pressure today 170/80.  Recheck of 168/88.  Patient previously evaluated in the emergency department  lisinopril increased to 10 mg daily.  Will increase lisinopril to 20 mg daily today with BMP in 2 weeks.  She has been encouraged to continue to monitor her blood pressures at home 1 to 2 hours after she takes her medications.  Paroxysmal SVT with cardiac monitor revealing no sustained arrhythmias.  She is continued on Toprol-XL 100 mg daily but denies palpitations today EKG today revealed sinus rhythm with sinus arrhythmia left atrial enlargement and left bundle branch block that has been chronic.  Mixed hyperlipidemia with known statin intolerance she has been continued on ezetimibe 10 mg daily.  Heart murmur noted on exam with previous echocardiogram revealing mild-moderate regurgitation and mild to moderate aortic valve sclerosis without stenosis.  Will continue with surveillance monitoring.       Dispo: Patient to return to clinic to see MD/APP in 3 months or sooner if needed  Signed, Clydette Privitera, NP

## 2022-08-22 ENCOUNTER — Telehealth: Payer: Self-pay

## 2022-08-22 MED ORDER — METOPROLOL SUCCINATE ER 50 MG PO TB24: 50.0000 mg | ORAL_TABLET | Freq: Every day | ORAL | 3 refills | Status: DC

## 2022-08-22 NOTE — Telephone Encounter (Signed)
Please advise patients daughter of providers message regarding medications changes and schedule a sooner appointment. Medication changes already completed and sent to pharmacy.  Charlsie Quest, NP  Micheline Maze, RN Can you please reach out to Ms. Weingart and her daughter tomorrow and have them take the lisinopril 20 mg daily and decrease her metoprolol to 50 mg daily.  We also need to move up her follow-up appointment sooner than in November.  I just got correspondence from her PCP and she is having some issues Thank you ever so much, [employer]

## 2022-08-23 ENCOUNTER — Telehealth: Payer: Self-pay

## 2022-08-23 NOTE — Telephone Encounter (Signed)
Patients daughter Malachi Bonds advised of providers recommendations and verbalized understanding. No further questions at this time.  Follow up appointment rescheduled for 9/16

## 2022-09-07 ENCOUNTER — Telehealth: Payer: Self-pay

## 2022-09-07 NOTE — Telephone Encounter (Signed)
Left detailed VM reminding patient to have labs completed that were requested at the last OV.

## 2022-10-01 ENCOUNTER — Ambulatory Visit: Payer: Medicare Other | Admitting: Cardiology

## 2022-10-15 ENCOUNTER — Encounter: Payer: Self-pay | Admitting: Cardiology

## 2022-10-15 ENCOUNTER — Ambulatory Visit: Payer: Medicare Other | Attending: Cardiology | Admitting: Cardiology

## 2022-10-15 VITALS — BP 144/80 | HR 86 | Ht 68.0 in | Wt 109.6 lb

## 2022-10-15 DIAGNOSIS — I10 Essential (primary) hypertension: Secondary | ICD-10-CM | POA: Diagnosis not present

## 2022-10-15 DIAGNOSIS — I471 Supraventricular tachycardia, unspecified: Secondary | ICD-10-CM

## 2022-10-15 DIAGNOSIS — E782 Mixed hyperlipidemia: Secondary | ICD-10-CM | POA: Diagnosis not present

## 2022-10-15 DIAGNOSIS — R011 Cardiac murmur, unspecified: Secondary | ICD-10-CM

## 2022-10-15 NOTE — Progress Notes (Signed)
Cardiology Office Note:  .   Date:  10/15/2022  ID:  Samantha Saunders, DOB [birth year], MRN [id number] PCP: [facility], [facility]  [facility] Providers Cardiologist:  Debbe Odea, MD    History of Present Illness: .   Samantha Saunders is a 89 y.o. female with a past medical history of hypertension, Alzheimer's dementia, paroxysmal SVT, heart murmur, hyperlipidemia, chronic left bundle branch block, history of syncope with collapse, who is here today for follow-up.  A cardiac monitor revealed sinus rhythm with occasional SVT, no sustained arrhythmias to suggest etiology of syncope, tachycardia noted during hospitalization.  Echocardiogram in 11/2019 showed normal systolic function with an EF of 60 to 65%.  She was last evaluated in the [facility] emergency department on 07/29/2022 with complaint of weakness.  She had weakness in her right upper extremity.  CT of the head was completed and was negative for any acute abnormality with a reassuring neurological exam.  CT scans did not show concerning findings however given the patient's report subjective weakness in the right upper extremity on the reassuring neurological exam she will proceed with MRI of the brain to rule out CVA which revealed negative.  She was considered stable and discharged with close follow-up.  She was last seen in clinic 08/04/2022 at that time she was doing well from the cardiac perspective without any complaints.  Her weakness had also resolved.  Her blood pressure remained elevated and her lisinopril was increased to 20 mg daily with a BMP in 2 weeks.  She is also continued on Toprol-XL 100 mg due to her paroxysmal SVT.  No further medication changes were made and no further testing was ordered at that time.  She returns to clinic today accompanied by her son.  She states overall she has been doing well.  Blood pressure has been better controlled since increasing her lisinopril at her last visit.  She states  unfortunately today that she was feeling slightly woozy.  She has not been orthostatic blood was noted a change in her daily schedule which her son states changes around on the day.  States that she has been compliant with her medications.  Denies any hospitalizations or visits to the emergency department.  ROS: 10 point review of system has been reviewed and considered negative with exception of what is been listed in the HPI  Studies Reviewed: .        Heart Monitor 12/19/21 Patient had a min HR of 46 bpm, max HR of 193 bpm, and avg HR of 67 bpm. Predominant underlying rhythm was Sinus Rhythm. Bundle Branch Block/IVCD was present.  53 Supraventricular Tachycardia runs occurred, the run with the fastest interval lasting 7 beats with a max rate of 193 bpm, the longest lasting 11.5 secs with an avg rate of 120 bpm.  Occasional PACs with a burden of 4% and occasional PVCs with a burden of 1.4%.   TTE 12/05/19 1. Left ventricular ejection fraction, by estimation, is 60 to 65%. The  left ventricle has normal function. The left ventricle has no regional  wall motion abnormalities. Left ventricular diastolic parameters were  normal.   2. Right ventricular systolic function is normal. The right ventricular  size is normal.   3. The mitral valve is normal in structure. Mild mitral valve  regurgitation.   4. The aortic valve is normal in structure. Aortic valve regurgitation is  mild. Mild to moderate aortic valve sclerosis/calcification is present,  without any evidence of aortic  stenosis.  Risk Assessment/Calculations:         Physical Exam:   VS:  BP (!) 144/80 (BP Location: Left Arm, Patient Position: Sitting, Cuff Size: Normal)   Pulse 86   Ht 5\' 8"  (1.727 m)   Wt 109 lb 9.6 oz (49.7 kg)   SpO2 97%   BMI 16.66 kg/m    Wt Readings from Last 3 Encounters:  10/15/22 109 lb 9.6 oz (49.7 kg)  08/14/22 112 lb 6.4 oz (51 kg)  05/08/22 115 lb (52.2 kg)    GEN: Well nourished, well  developed in no acute distress NECK: No JVD; No carotid bruits CARDIAC: RRR, II/VI systolic murmur without rubs or gallops RESPIRATORY:  Clear to auscultation without rales, wheezing or rhonchi  ABDOMEN: Soft, non-tender, non-distended EXTREMITIES:  No edema; No deformity   ASSESSMENT AND PLAN: .   Primary hypertension blood pressure today 140/80.  Blood pressure is relatively stable considered for her blood pressure significantly right hand.  They do continue to keep a log of her blood pressures at home her blood pressures have been running stable at home as well.  She has been continued on lisinopril 20 mg daily and Toprol-XL 50 mg daily.  She is also being sent for follow-up BMP/CBC today as she previously had a BMP/CBC ordered when her medication was increased but she is yet to have those labs completed.  Paroxysmal SVT on cardiac monitor revealing no sustained arrhythmias.  She denies any palpitations today.  She is continued on Toprol-XL 50 mg daily.  Mixed hyperlipidemia with a known statin intolerance.  She has been continued on ezetimibe 10 mg daily.  This continues to be monitored by her PCP.  Mild mitral valve regurgitation and mild to moderate aortic valve sclerosis without evidence of stenosis and heart murmur noted on exam.  Continue to follow with surveillance echocardiograms.       Dispo: Patient to return to clinic to see MD/APP in 3 months or sooner if needed  Signed, Cyniah Gossard, NP

## 2022-10-15 NOTE — Patient Instructions (Signed)
Medication Instructions:  Your physician recommends that you continue on your current medications as directed. Please refer to the Current Medication list given to you today.  *If you need a refill on your cardiac medications before your next appointment, please call your pharmacy*  Lab Work: Your provider would like for you to have following labs drawn today BMP & CBC.   If you have labs (blood work) drawn today and your tests are completely normal, you will receive your results only by: MyChart Message (if you have MyChart) OR A paper copy in the mail If you have any lab test that is abnormal or we need to change your treatment, we will call you to review the results.  Testing/Procedures: -None ordered  Follow-Up: At [facility], you and your health needs are our priority.  As part of our continuing mission to provide you with exceptional heart care, we have created designated Provider Care Teams.  These Care Teams include your primary Cardiologist (physician) and Advanced Practice Providers (APPs -  Physician Assistants and Nurse Practitioners) who all work together to provide you with the care you need, when you need it.  Your next appointment:   3 month(s)  Provider:   You may see Debbe Odea, MD or one of the following Advanced Practice Providers on your designated Care Team:   Nicolasa Ducking, NP Eula Listen, PA-C Cadence Fransico Michael, PA-C Charlsie Quest, NP    Other Instructions -None

## 2022-10-16 LAB — BASIC METABOLIC PANEL
BUN/Creatinine Ratio: 12 (ref 12–28)
BUN: 13 mg/dL (ref 10–36)
CO2: 25 mmol/L (ref 20–29)
Calcium: 10.2 mg/dL (ref 8.7–10.3)
Chloride: 101 mmol/L (ref 96–106)
Creatinine, Ser: 1.05 mg/dL — ABNORMAL HIGH (ref 0.57–1.00)
Glucose: 161 mg/dL — ABNORMAL HIGH (ref 70–99)
Potassium: 4 mmol/L (ref 3.5–5.2)
Sodium: 141 mmol/L (ref 134–144)
eGFR: 49 mL/min/{1.73_m2} — ABNORMAL LOW (ref >59)

## 2022-10-16 LAB — CBC
Hematocrit: 40.7 % (ref 34.0–46.6)
Hemoglobin: 13.5 g/dL (ref 11.1–15.9)
MCH: 31.3 pg (ref 26.6–33.0)
MCHC: 33.2 g/dL (ref 31.5–35.7)
MCV: 94 fL (ref 79–97)
Platelets: 241 10*3/uL (ref 150–450)
RBC: 4.32 x10E6/uL (ref 3.77–5.28)
RDW: 11.9 % (ref 11.7–15.4)
WBC: 5.7 10*3/uL (ref 3.4–10.8)

## 2022-10-16 NOTE — Progress Notes (Signed)
Blood counts remain stable. No elevated white count indicative of infection. Blood sugar and kidney function slightly elevated from baseline. Ensure to maintain adequate hydration.

## 2022-11-16 ENCOUNTER — Ambulatory Visit: Payer: Medicare Other | Admitting: Cardiology

## 2023-01-28 ENCOUNTER — Ambulatory Visit: Payer: Medicare Other | Admitting: Cardiology

## 2023-02-04 ENCOUNTER — Encounter: Payer: Self-pay | Admitting: Cardiology

## 2023-02-04 ENCOUNTER — Ambulatory Visit: Payer: Medicare Other | Attending: Cardiology | Admitting: Cardiology

## 2023-02-04 VITALS — BP 132/74 | HR 62 | Ht 65.0 in | Wt 116.8 lb

## 2023-02-04 DIAGNOSIS — I10 Essential (primary) hypertension: Secondary | ICD-10-CM

## 2023-02-04 DIAGNOSIS — R011 Cardiac murmur, unspecified: Secondary | ICD-10-CM | POA: Diagnosis not present

## 2023-02-04 DIAGNOSIS — I471 Supraventricular tachycardia, unspecified: Secondary | ICD-10-CM | POA: Diagnosis not present

## 2023-02-04 NOTE — Patient Instructions (Signed)
 Medication Instructions:   Your physician recommends that you continue on your current medications as directed. Please refer to the Current Medication list given to you today.  *If you need a refill on your cardiac medications before your next appointment, please call your pharmacy*   Lab Work:  None Ordered  If you have labs (blood work) drawn today and your tests are completely normal, you will receive your results only by: MyChart Message (if you have MyChart) OR A paper copy in the mail If you have any lab test that is abnormal or we need to change your treatment, we will call you to review the results.   Testing/Procedures:  Your physician has requested that you have an echocardiogram. Echocardiography is a painless test that uses sound waves to create images of your heart. It provides your doctor with information about the size and shape of your heart and how well your heart's chambers and valves are working. This procedure takes approximately one hour. There are no restrictions for this procedure. Please do NOT wear cologne, perfume, aftershave, or lotions (deodorant is allowed). Please arrive 15 minutes prior to your appointment time.  Please note: We ask at that you not bring children with you during ultrasound (echo/ vascular) testing. Due to room size and safety concerns, children are not allowed in the ultrasound rooms during exams. Our front office staff cannot provide observation of children in our lobby area while testing is being conducted. An adult accompanying a patient to their appointment will only be allowed in the ultrasound room at the discretion of the ultrasound technician under special circumstances. We apologize for any inconvenience.    Follow-Up: At [facility], you and your health needs are our priority.  As part of our continuing mission to provide you with exceptional heart care, we have created designated Provider Care Teams.  These Care Teams  include your primary Cardiologist (physician) and Advanced Practice Providers (APPs -  Physician Assistants and Nurse Practitioners) who all work together to provide you with the care you need, when you need it.  We recommend signing up for the patient portal called "MyChart".  Sign up information is provided on this After Visit Summary.  MyChart is used to connect with patients for Virtual Visits (Telemedicine).  Patients are able to view lab/test results, encounter notes, upcoming appointments, etc.  Non-urgent messages can be sent to your provider as well.   To learn more about what you can do with MyChart, go to [email or web address].    Your next appointment:   6 month(s)  Provider:   You may see Debbe Odea, MD or one of the following Advanced Practice Providers on your designated Care Team:   Nicolasa Ducking, NP Eula Listen, PA-C Cadence Fransico Michael, PA-C Charlsie Quest, NP Carlos Levering, NP

## 2023-02-04 NOTE — Progress Notes (Signed)
Cardiology Office Note:    Date:  02/04/2023   ID:  Samantha Saunders, DOB Sep 23, 1926, MRN [id number]  PCP:  Center, [facility]   [facility] Providers Cardiologist:  Debbe Odea, MD     Referring MD: Center, [facility]*   Chief Complaint  Patient presents with   Follow-up    Patient denies new or acute cardiac problems/concerns today.      History of Present Illness:    Samantha Saunders is a 89 y.o. female with a hx of hypertension, paroxysmal SVT, Alzheimer's dementia presenting for follow-up.    Being seen for paroxysmal SVT and hypertension.  Has a history of weakness and falls.  History of low blood pressure in the context of UTI.  Patient tolerating current BP meds, denies dizziness, palpitations, hypotension or syncope.  Compliant with Toprol-XL, lisinopril as prescribed.  Feels well, has no concerns at this time.  Prior notes/testing Cardiac monitor 12/2021 occasional paroxysmal SVT, no sustained arrhythmias. Echo 11/2019 showed normal systolic function.  EF 60 to 65%   Past Medical History:  Diagnosis Date   Heart murmur    History of kidney infection    Hyperlipidemia    Hypertension    LBBB (left bundle branch block)    Syncope and collapse     Past Surgical History:  Procedure Laterality Date   ABDOMINAL HYSTERECTOMY      Current Medications: Current Meds  Medication Sig   aspirin 81 MG tablet Take 81 mg by mouth daily.   Cholecalciferol (D3 2000 PO) Take 2,000 Int'l Units/L by mouth daily.   ezetimibe (ZETIA) 10 MG tablet Take 10 mg by mouth daily.   lisinopril (ZESTRIL) 20 MG tablet Take 1 tablet (20 mg total) by mouth daily.   metoprolol succinate (TOPROL-XL) 50 MG 24 hr tablet Take 1 tablet (50 mg total) by mouth daily. Take with or immediately following a meal.   Vitamin E (VITAMIN E/D-ALPHA NATURAL) 268 MG (400 UNIT) CAPS Take 400 Units by mouth daily.     Allergies:   Statins   Social History   Socioeconomic  History   Marital status: Widowed    Spouse name: Not on file   Number of children: Not on file   Years of education: Not on file   Highest education level: Not on file  Occupational History   Not on file  Tobacco Use   Smoking status: Never    Passive exposure: Past   Smokeless tobacco: Never  Vaping Use   Vaping status: Never Used  Substance and Sexual Activity   Alcohol use: No   Drug use: No   Sexual activity: Not Currently    Birth control/protection: Post-menopausal  Other Topics Concern   Not on file  Social History Narrative   Not on file   Social Drivers of Health   Financial Resource Strain: Not on file  Food Insecurity: No Food Insecurity (11/16/2021)   Hunger Vital Sign    Worried About Running Out of Food in the Last Year: Never true    Ran Out of Food in the Last Year: Never true  Transportation Needs: No Transportation Needs (11/16/2021)   PRAPARE - Administrator, Civil Service (Medical): No    Lack of Transportation (Non-Medical): No  Physical Activity: Not on file  Stress: Not on file  Social Connections: Not on file     Family History: The patient's family history includes Hypertension in her mother; Stroke in her mother.  ROS:   Please see the history of present illness.     All other systems reviewed and are negative.  EKGs/Labs/Other Studies Reviewed:    The following studies were reviewed today:   EKG Interpretation Date/Time:  Monday February 04 2023 14:31:00 EST Ventricular Rate:  62 PR Interval:  150 QRS Duration:  128 QT Interval:  458 QTC Calculation: 464 R Axis:   17  Text Interpretation: Sinus rhythm with marked sinus arrhythmia Left bundle branch block Confirmed by Debbe Odea ([id number]) on 02/04/2023 2:37:11 PM    Recent Labs: 07/29/2022: ALT 23 10/15/2022: BUN 13; Creatinine, Ser 1.05; Hemoglobin 13.5; Platelets 241; Potassium 4.0; Sodium 141  Recent Lipid Panel No results found for: "CHOL", "TRIG", "HDL",  "CHOLHDL", "VLDL", "LDLCALC", "LDLDIRECT"   Risk Assessment/Calculations:             Physical Exam:    VS:  BP 132/74 (BP Location: Left Arm, Patient Position: Sitting, Cuff Size: Normal)   Pulse 62   Ht 5\' 5"  (1.651 m)   Wt 116 lb 12.8 oz (53 kg)   SpO2 92%   BMI 19.44 kg/m     Wt Readings from Last 3 Encounters:  02/04/23 116 lb 12.8 oz (53 kg)  10/15/22 109 lb 9.6 oz (49.7 kg)  08/14/22 112 lb 6.4 oz (51 kg)     GEN:  Well nourished, well developed in no acute distress HEENT: Normal NECK: No JVD; No carotid bruits CARDIAC: RRR, 2/6 systolic murmur RESPIRATORY:  Clear to auscultation without rales, wheezing or rhonchi  ABDOMEN: Soft, non-tender, non-distended MUSCULOSKELETAL:  No edema; No deformity  SKIN: Warm and dry NEUROLOGIC:  Alert and oriented to person and place. PSYCHIATRIC:  Normal affect   ASSESSMENT:    1. Systolic murmur   2. Paroxysmal SVT (supraventricular tachycardia) (HCC)   3. Primary hypertension    PLAN:    In order of problems listed above:  Systolic murmur on exam, aortic valve calcification on echo in 2021.  Repeat echocardiogram to evaluate any significant valvular abnormalities. Paroxysmal SVT, continue Toprol-XL 50 mg daily, no significant palpitations. Hypertension, BP controlled.  Okay to let BP run high, systolics up to 150 okay.  Continue lisinopril 20 mg daily, Toprol-XL 50 mg.  Follow-up in 3-6 months     Medication Adjustments/Labs and Tests Ordered: Current medicines are reviewed at length with the patient today.  Concerns regarding medicines are outlined above.  Orders Placed This Encounter  Procedures   EKG 12-Lead   ECHOCARDIOGRAM COMPLETE   No orders of the defined types were placed in this encounter.   Patient Instructions  Medication Instructions:   Your physician recommends that you continue on your current medications as directed. Please refer to the Current Medication list given to you today.  *If you  need a refill on your cardiac medications before your next appointment, please call your pharmacy*   Lab Work:  None Ordered  If you have labs (blood work) drawn today and your tests are completely normal, you will receive your results only by: MyChart Message (if you have MyChart) OR A paper copy in the mail If you have any lab test that is abnormal or we need to change your treatment, we will call you to review the results.   Testing/Procedures:  Your physician has requested that you have an echocardiogram. Echocardiography is a painless test that uses sound waves to create images of your heart. It provides your doctor with information about the size and shape of  your heart and how well your heart's chambers and valves are working. This procedure takes approximately one hour. There are no restrictions for this procedure. Please do NOT wear cologne, perfume, aftershave, or lotions (deodorant is allowed). Please arrive 15 minutes prior to your appointment time.  Please note: We ask at that you not bring children with you during ultrasound (echo/ vascular) testing. Due to room size and safety concerns, children are not allowed in the ultrasound rooms during exams. Our front office staff cannot provide observation of children in our lobby area while testing is being conducted. An adult accompanying a patient to their appointment will only be allowed in the ultrasound room at the discretion of the ultrasound technician under special circumstances. We apologize for any inconvenience.    Follow-Up: At [facility], you and your health needs are our priority.  As part of our continuing mission to provide you with exceptional heart care, we have created designated Provider Care Teams.  These Care Teams include your primary Cardiologist (physician) and Advanced Practice Providers (APPs -  Physician Assistants and Nurse Practitioners) who all work together to provide you with the care you  need, when you need it.  We recommend signing up for the patient portal called "MyChart".  Sign up information is provided on this After Visit Summary.  MyChart is used to connect with patients for Virtual Visits (Telemedicine).  Patients are able to view lab/test results, encounter notes, upcoming appointments, etc.  Non-urgent messages can be sent to your provider as well.   To learn more about what you can do with MyChart, go to [email or web address].    Your next appointment:   6 month(s)  Provider:   You may see Debbe Odea, MD or one of the following Advanced Practice Providers on your designated Care Team:   Nicolasa Ducking, NP Eula Listen, PA-C Cadence Fransico Michael, PA-C Charlsie Quest, NP Carlos Levering, NP    Signed, Debbe Odea, MD  02/04/2023 3:31 PM    [facility]

## 2023-02-20 ENCOUNTER — Ambulatory Visit: Payer: Medicare Other | Attending: Cardiology

## 2023-02-20 DIAGNOSIS — R011 Cardiac murmur, unspecified: Secondary | ICD-10-CM

## 2023-02-20 LAB — ECHOCARDIOGRAM COMPLETE
AR max vel: 1.46 cm2
AV Area VTI: 1.57 cm2
AV Area mean vel: 1.46 cm2
AV Mean grad: 11.4 mm[Hg]
AV Peak grad: 20.8 mm[Hg]
Ao pk vel: 2.28 m/s
Area-P 1/2: 3.91 cm2
S' Lateral: 2.9 cm

## 2023-08-06 ENCOUNTER — Other Ambulatory Visit: Payer: Self-pay | Admitting: Cardiology

## 2023-08-13 ENCOUNTER — Other Ambulatory Visit: Payer: Self-pay | Admitting: Cardiology

## 2023-10-17 ENCOUNTER — Ambulatory Visit: Admitting: Cardiology

## 2023-11-05 ENCOUNTER — Ambulatory Visit: Attending: Cardiology | Admitting: Cardiology

## 2023-11-05 ENCOUNTER — Encounter: Payer: Self-pay | Admitting: Cardiology

## 2023-11-05 VITALS — BP 154/84 | HR 72 | Ht 65.0 in | Wt 117.4 lb

## 2023-11-05 DIAGNOSIS — I10 Essential (primary) hypertension: Secondary | ICD-10-CM | POA: Diagnosis not present

## 2023-11-05 DIAGNOSIS — R011 Cardiac murmur, unspecified: Secondary | ICD-10-CM

## 2023-11-05 DIAGNOSIS — I471 Supraventricular tachycardia, unspecified: Secondary | ICD-10-CM | POA: Diagnosis not present

## 2023-11-05 DIAGNOSIS — E782 Mixed hyperlipidemia: Secondary | ICD-10-CM

## 2023-11-05 DIAGNOSIS — I340 Nonrheumatic mitral (valve) insufficiency: Secondary | ICD-10-CM

## 2023-11-05 NOTE — Progress Notes (Unsigned)
 Cardiology Office Note   Date:  11/07/2023  ID:  Samantha Saunders, DOB 04-11-1926, MRN [id number] PCP: Center, [facility]  [facility] Providers Cardiologist:  Redell Cave, MD     History of Present Illness Samantha Saunders is a 89 y.o. female with past medical history hypertension, Alzheimer's dementia, paroxysmal SVT, heart murmur, hyperlipidemia, chronic left bundle branch block, history of syncope with collapse, who is today for follow-up.   Previous cardiac monitor revealed sinus rhythm with occasional SVT, no sustained arrhythmias to suggest etiology of syncope, tachycardia was noted during hospitalization.  Echocardiogram 11/21 showed normal systolic function with an EF of 60 to 65%.  She was last evaluated in the [facility] emergency department on 07/29/2022 with complaint of weakness.  She had weakness in her right upper extremity.  CT of the head was completed and was negative for any acute abnormality with a reassuring neurological exam.  CT scans did not show concerning findings however given the patient's report subjective weakness in the right upper extremity on the reassuring neurological exam she will proceed with MRI of the brain to rule out CVA which revealed negative.  She was considered stable and discharged with close follow-up.  She was evaluated in clinic 08/04/2022 at that time she was doing well with current respiratory complaints.  Her weakness is also resolved.  Blood pressure remained elevated and her lisinopril  was increased to 20 mg daily BMP in 2 weeks.  She was also continued on Toprol -XL 100 mg consider paroxysmal SVT.  No further medication changes made no further testing was ordered at that time.  She was evaluated in clinic 10/05/2022 accompanied by her son.  She endorsed to have been doing well.  Blood pressure was better controlled with increasing lisinopril .  She stated unfortunately today she felt slightly woozy.  She did not been  orthostatic.  There were no changes made to her medication regimen and no further testing that was ordered at that time.   She was last seen in clinic 02/04/2023 and stated that she had been doing well from a cardiac perspective with no complaints.  Repeat echocardiogram was ordered.  No changes to medication regimen.  She returns to clinic today accompanied by a family member.  She states that she has been doing well.  She denies any chest pain, shortness of breath or dyspnea on exertion.  She has eaten breakfast this morning.  She had forgotten a little bit about her appointment this morning as her blood pressure is slightly elevated.  States she has been compliant with her current medication.  Denies any hospitalizations or visits to the emergency department.  ROS: 10 point review of systems has been reviewed and considered negative except ones been listed in HPI  Studies Reviewed EKG Interpretation Date/Time:  Tuesday November 05 2023 15:17:17 EDT Ventricular Rate:  72 PR Interval:  146 QRS Duration:  122 QT Interval:  414 QTC Calculation: 453 R Axis:   -27  Text Interpretation: Normal sinus rhythm with sinus arrhythmia Possible Left atrial enlargement Left bundle branch block When compared with ECG of 04-Feb-2023 14:31, No significant change since last tracing Confirmed by Gerard Frederick ([id number]) on 11/05/2023 3:29:51 PM    2d echo 02/20/2023 1. Left ventricular ejection fraction, by estimation, is 60 to 65%. Left  ventricular ejection fraction by 3D volume is 57 %. The left ventricle has  normal function. The left ventricle has no regional wall motion  abnormalities. There is mild left  ventricular  hypertrophy. Left ventricular diastolic parameters are  consistent with Grade I diastolic dysfunction (impaired relaxation). The  average left ventricular global longitudinal strain is -13.1 %. The global  longitudinal strain is abnormal.   2. Right ventricular systolic function is normal.  The right ventricular  size is normal. There is normal pulmonary artery systolic pressure. The  estimated right ventricular systolic pressure is 34.4 mmHg.   3. The mitral valve is normal in structure. No evidence of mitral valve  regurgitation. No evidence of mitral stenosis.   4. Tricuspid valve regurgitation is mild to moderate.   5. The aortic valve is calcified. There is moderate calcification of the  aortic valve. Aortic valve regurgitation is not visualized. Mild aortic  valve stenosis. Aortic valve area, by VTI measures 1.57 cm. Aortic valve  mean gradient measures 11.4 mmHg.  Aortic valve Vmax measures 2.28 m/s.   6. There is mild dilatation of the ascending aorta, measuring 42 mm.   7. The inferior vena cava is normal in size with greater than 50%  respiratory variability, suggesting right atrial pressure of 3 mmHg.   Heart Monitor 12/19/21 Patient had a min HR of 46 bpm, max HR of 193 bpm, and avg HR of 67 bpm. Predominant underlying rhythm was Sinus Rhythm. Bundle Branch Block/IVCD was present.  53 Supraventricular Tachycardia runs occurred, the run with the fastest interval lasting 7 beats with a max rate of 193 bpm, the longest lasting 11.5 secs with an avg rate of 120 bpm.  Occasional PACs with a burden of 4% and occasional PVCs with a burden of 1.4%.   TTE 12/05/19 1. Left ventricular ejection fraction, by estimation, is 60 to 65%. The  left ventricle has normal function. The left ventricle has no regional  wall motion abnormalities. Left ventricular diastolic parameters were  normal.   2. Right ventricular systolic function is normal. The right ventricular  size is normal.   3. The mitral valve is normal in structure. Mild mitral valve  regurgitation.   4. The aortic valve is normal in structure. Aortic valve regurgitation is  mild. Mild to moderate aortic valve sclerosis/calcification is present,  without any evidence of aortic stenosis.   Risk  Assessment/Calculations     Physical Exam VS:  BP (!) 154/84   Pulse 72   Ht 5' 5 (1.651 m)   Wt 117 lb 6.4 oz (53.3 kg)   SpO2 98%   BMI 19.54 kg/m        Wt Readings from Last 3 Encounters:  11/05/23 117 lb 6.4 oz (53.3 kg)  02/04/23 116 lb 12.8 oz (53 kg)  10/15/22 109 lb 9.6 oz (49.7 kg)    GEN: Well nourished, well developed in no acute distress NECK: No JVD; No carotid bruits CARDIAC: RRR, II/VI systolic murmur RUSB, without rubs or gallops RESPIRATORY:  Clear to auscultation without rales, wheezing or rhonchi  ABDOMEN: Soft, non-tender, non-distended EXTREMITIES:  No edema; No deformity   ASSESSMENT AND PLAN Primary hypertension with blood pressure today 150/84.  Blood pressure slightly elevated today as she had forgotten about her appointment today.  She has been continued on lisinopril  20 mg daily and Toprol -XL 50 mg daily.  She has been encouraged to continue to monitor her blood pressure 1 to 2 hours postmedication administration at the facility.  Blood pressure has been fairly well-controlled.  Paroxysmal SVT without any sustained arrhythmias.  She denies any palpitations today.  Son that is with her today states that she has not  had any further complaints.  EKG reveals sinus rhythm with sinus arrhythmia with a rate of 72 with her chronic left bundle branch block with no significant changes noted.  Mixed hyperlipidemia with a known statin intolerance.  She is continued on ezetimibe  10 mg daily.  Ongoing management per PCP.  Recent labs have been requested from primary care provider.  Mild mitral valve regurgitation and mild to moderate aortic valve sclerosis without evidence of stenosis no murmur noted on exam.  Continue to follow with surveillance echocardiograms.       Dispo: Patient to return to clinic to see MD/APP in 6 months or sooner if needed for further evaluation.  Signed, Asti Mackley, NP

## 2023-11-05 NOTE — Patient Instructions (Signed)
 Medication Instructions:  Your physician recommends that you continue on your current medications as directed. Please refer to the Current Medication list given to you today.   *If you need a refill on your cardiac medications before your next appointment, please call your pharmacy*  Lab Work: No labs ordered today  If you have labs (blood work) drawn today and your tests are completely normal, you will receive your results only by: MyChart Message (if you have MyChart) OR A paper copy in the mail If you have any lab test that is abnormal or we need to change your treatment, we will call you to review the results.  Testing/Procedures: No test ordered today   Follow-Up: At [facility], you and your health needs are our priority.  As part of our continuing mission to provide you with exceptional heart care, our providers are all part of one team.  This team includes your primary Cardiologist (physician) and Advanced Practice Providers or APPs (Physician Assistants and Nurse Practitioners) who all work together to provide you with the care you need, when you need it.  Your next appointment:   6 month(s)  Provider:   You may see Redell Cave, MD or one of the following Advanced Practice Providers on your designated Care Team:   Tylene Lunch, NP  We recommend signing up for the patient portal called MyChart.  Sign up information is provided on this After Visit Summary.  MyChart is used to connect with patients for Virtual Visits (Telemedicine).  Patients are able to view lab/test results, encounter notes, upcoming appointments, etc.  Non-urgent messages can be sent to your provider as well.   To learn more about what you can do with MyChart, go to [email or web address].

## 2023-11-07 ENCOUNTER — Encounter: Payer: Self-pay | Admitting: Cardiology

## 2024-02-10 ENCOUNTER — Other Ambulatory Visit: Payer: Self-pay | Admitting: Cardiology

## 2024-02-21 ENCOUNTER — Other Ambulatory Visit: Payer: Self-pay | Admitting: Cardiology
